# Patient Record
Sex: Male | Born: 1945
Health system: Southern US, Community
[De-identification: ages and names within clinical notes are randomized; demographics above are authoritative.]

## PROBLEM LIST (undated history)

## (undated) DIAGNOSIS — F329 Major depressive disorder, single episode, unspecified: Secondary | ICD-10-CM

## (undated) DIAGNOSIS — F32A Depression, unspecified: Secondary | ICD-10-CM

## (undated) DIAGNOSIS — M1990 Unspecified osteoarthritis, unspecified site: Secondary | ICD-10-CM

## (undated) HISTORY — PX: COLONOSCOPY: SHX174

## (undated) HISTORY — PX: EYE SURGERY: SHX253

## (undated) HISTORY — PX: KNEE ARTHROSCOPY: SUR90

## (undated) HISTORY — PX: CATARACT EXTRACTION: SUR2

## (undated) HISTORY — PX: BACK SURGERY: SHX140

---
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
## 2016-11-20 DIAGNOSIS — F331 Major depressive disorder, recurrent, moderate: Secondary | ICD-10-CM | POA: Diagnosis not present

## 2016-11-26 DIAGNOSIS — F331 Major depressive disorder, recurrent, moderate: Secondary | ICD-10-CM | POA: Diagnosis not present

## 2016-12-11 DIAGNOSIS — F331 Major depressive disorder, recurrent, moderate: Secondary | ICD-10-CM | POA: Diagnosis not present

## 2016-12-11 DIAGNOSIS — F410 Panic disorder [episodic paroxysmal anxiety] without agoraphobia: Secondary | ICD-10-CM | POA: Diagnosis not present

## 2016-12-18 DIAGNOSIS — F331 Major depressive disorder, recurrent, moderate: Secondary | ICD-10-CM | POA: Diagnosis not present

## 2016-12-18 DIAGNOSIS — F410 Panic disorder [episodic paroxysmal anxiety] without agoraphobia: Secondary | ICD-10-CM | POA: Diagnosis not present

## 2016-12-24 DIAGNOSIS — F331 Major depressive disorder, recurrent, moderate: Secondary | ICD-10-CM | POA: Diagnosis not present

## 2016-12-24 DIAGNOSIS — F410 Panic disorder [episodic paroxysmal anxiety] without agoraphobia: Secondary | ICD-10-CM | POA: Diagnosis not present

## 2017-01-01 DIAGNOSIS — F331 Major depressive disorder, recurrent, moderate: Secondary | ICD-10-CM | POA: Diagnosis not present

## 2017-01-08 DIAGNOSIS — F331 Major depressive disorder, recurrent, moderate: Secondary | ICD-10-CM | POA: Diagnosis not present

## 2017-01-08 DIAGNOSIS — F410 Panic disorder [episodic paroxysmal anxiety] without agoraphobia: Secondary | ICD-10-CM | POA: Diagnosis not present

## 2017-01-14 DIAGNOSIS — F331 Major depressive disorder, recurrent, moderate: Secondary | ICD-10-CM | POA: Diagnosis not present

## 2017-01-15 DIAGNOSIS — F331 Major depressive disorder, recurrent, moderate: Secondary | ICD-10-CM | POA: Diagnosis not present

## 2017-01-22 DIAGNOSIS — F410 Panic disorder [episodic paroxysmal anxiety] without agoraphobia: Secondary | ICD-10-CM | POA: Diagnosis not present

## 2017-01-22 DIAGNOSIS — F331 Major depressive disorder, recurrent, moderate: Secondary | ICD-10-CM | POA: Diagnosis not present

## 2017-01-29 DIAGNOSIS — F410 Panic disorder [episodic paroxysmal anxiety] without agoraphobia: Secondary | ICD-10-CM | POA: Diagnosis not present

## 2017-01-29 DIAGNOSIS — F331 Major depressive disorder, recurrent, moderate: Secondary | ICD-10-CM | POA: Diagnosis not present

## 2017-02-05 DIAGNOSIS — F410 Panic disorder [episodic paroxysmal anxiety] without agoraphobia: Secondary | ICD-10-CM | POA: Diagnosis not present

## 2017-02-05 DIAGNOSIS — F331 Major depressive disorder, recurrent, moderate: Secondary | ICD-10-CM | POA: Diagnosis not present

## 2017-02-12 DIAGNOSIS — F331 Major depressive disorder, recurrent, moderate: Secondary | ICD-10-CM | POA: Diagnosis not present

## 2017-02-12 DIAGNOSIS — F410 Panic disorder [episodic paroxysmal anxiety] without agoraphobia: Secondary | ICD-10-CM | POA: Diagnosis not present

## 2017-02-26 DIAGNOSIS — F331 Major depressive disorder, recurrent, moderate: Secondary | ICD-10-CM | POA: Diagnosis not present

## 2017-02-26 DIAGNOSIS — F410 Panic disorder [episodic paroxysmal anxiety] without agoraphobia: Secondary | ICD-10-CM | POA: Diagnosis not present

## 2017-03-12 DIAGNOSIS — F331 Major depressive disorder, recurrent, moderate: Secondary | ICD-10-CM | POA: Diagnosis not present

## 2017-03-12 DIAGNOSIS — F410 Panic disorder [episodic paroxysmal anxiety] without agoraphobia: Secondary | ICD-10-CM | POA: Diagnosis not present

## 2017-03-26 DIAGNOSIS — F331 Major depressive disorder, recurrent, moderate: Secondary | ICD-10-CM | POA: Diagnosis not present

## 2017-04-09 DIAGNOSIS — F331 Major depressive disorder, recurrent, moderate: Secondary | ICD-10-CM | POA: Diagnosis not present

## 2017-04-09 DIAGNOSIS — F410 Panic disorder [episodic paroxysmal anxiety] without agoraphobia: Secondary | ICD-10-CM | POA: Diagnosis not present

## 2017-05-07 DIAGNOSIS — F331 Major depressive disorder, recurrent, moderate: Secondary | ICD-10-CM | POA: Diagnosis not present

## 2017-05-07 DIAGNOSIS — F410 Panic disorder [episodic paroxysmal anxiety] without agoraphobia: Secondary | ICD-10-CM | POA: Diagnosis not present

## 2017-05-27 DIAGNOSIS — F410 Panic disorder [episodic paroxysmal anxiety] without agoraphobia: Secondary | ICD-10-CM | POA: Diagnosis not present

## 2017-05-27 DIAGNOSIS — F331 Major depressive disorder, recurrent, moderate: Secondary | ICD-10-CM | POA: Diagnosis not present

## 2017-06-11 DIAGNOSIS — F331 Major depressive disorder, recurrent, moderate: Secondary | ICD-10-CM | POA: Diagnosis not present

## 2017-06-25 DIAGNOSIS — F331 Major depressive disorder, recurrent, moderate: Secondary | ICD-10-CM | POA: Diagnosis not present

## 2017-06-25 DIAGNOSIS — F410 Panic disorder [episodic paroxysmal anxiety] without agoraphobia: Secondary | ICD-10-CM | POA: Diagnosis not present

## 2017-07-09 DIAGNOSIS — F331 Major depressive disorder, recurrent, moderate: Secondary | ICD-10-CM | POA: Diagnosis not present

## 2017-07-09 DIAGNOSIS — F410 Panic disorder [episodic paroxysmal anxiety] without agoraphobia: Secondary | ICD-10-CM | POA: Diagnosis not present

## 2017-07-23 DIAGNOSIS — F410 Panic disorder [episodic paroxysmal anxiety] without agoraphobia: Secondary | ICD-10-CM | POA: Diagnosis not present

## 2017-07-23 DIAGNOSIS — F331 Major depressive disorder, recurrent, moderate: Secondary | ICD-10-CM | POA: Diagnosis not present

## 2017-08-05 DIAGNOSIS — F410 Panic disorder [episodic paroxysmal anxiety] without agoraphobia: Secondary | ICD-10-CM | POA: Diagnosis not present

## 2017-08-05 DIAGNOSIS — F331 Major depressive disorder, recurrent, moderate: Secondary | ICD-10-CM | POA: Diagnosis not present

## 2017-08-21 DIAGNOSIS — F331 Major depressive disorder, recurrent, moderate: Secondary | ICD-10-CM | POA: Diagnosis not present

## 2017-09-03 DIAGNOSIS — F331 Major depressive disorder, recurrent, moderate: Secondary | ICD-10-CM | POA: Diagnosis not present

## 2017-09-03 DIAGNOSIS — F410 Panic disorder [episodic paroxysmal anxiety] without agoraphobia: Secondary | ICD-10-CM | POA: Diagnosis not present

## 2017-09-04 DIAGNOSIS — Z23 Encounter for immunization: Secondary | ICD-10-CM | POA: Diagnosis not present

## 2017-09-07 DIAGNOSIS — F331 Major depressive disorder, recurrent, moderate: Secondary | ICD-10-CM | POA: Diagnosis not present

## 2017-09-17 DIAGNOSIS — F410 Panic disorder [episodic paroxysmal anxiety] without agoraphobia: Secondary | ICD-10-CM | POA: Diagnosis not present

## 2017-09-17 DIAGNOSIS — F331 Major depressive disorder, recurrent, moderate: Secondary | ICD-10-CM | POA: Diagnosis not present

## 2017-10-15 DIAGNOSIS — F331 Major depressive disorder, recurrent, moderate: Secondary | ICD-10-CM | POA: Diagnosis not present

## 2017-10-29 DIAGNOSIS — F410 Panic disorder [episodic paroxysmal anxiety] without agoraphobia: Secondary | ICD-10-CM | POA: Diagnosis not present

## 2017-10-29 DIAGNOSIS — F331 Major depressive disorder, recurrent, moderate: Secondary | ICD-10-CM | POA: Diagnosis not present

## 2017-11-05 DIAGNOSIS — F331 Major depressive disorder, recurrent, moderate: Secondary | ICD-10-CM | POA: Diagnosis not present

## 2017-11-05 DIAGNOSIS — F410 Panic disorder [episodic paroxysmal anxiety] without agoraphobia: Secondary | ICD-10-CM | POA: Diagnosis not present

## 2017-11-19 DIAGNOSIS — F410 Panic disorder [episodic paroxysmal anxiety] without agoraphobia: Secondary | ICD-10-CM | POA: Diagnosis not present

## 2017-11-19 DIAGNOSIS — F331 Major depressive disorder, recurrent, moderate: Secondary | ICD-10-CM | POA: Diagnosis not present

## 2017-12-10 DIAGNOSIS — F331 Major depressive disorder, recurrent, moderate: Secondary | ICD-10-CM | POA: Diagnosis not present

## 2017-12-10 DIAGNOSIS — F410 Panic disorder [episodic paroxysmal anxiety] without agoraphobia: Secondary | ICD-10-CM | POA: Diagnosis not present

## 2017-12-30 DIAGNOSIS — F331 Major depressive disorder, recurrent, moderate: Secondary | ICD-10-CM | POA: Diagnosis not present

## 2017-12-30 DIAGNOSIS — F410 Panic disorder [episodic paroxysmal anxiety] without agoraphobia: Secondary | ICD-10-CM | POA: Diagnosis not present

## 2018-01-21 DIAGNOSIS — F331 Major depressive disorder, recurrent, moderate: Secondary | ICD-10-CM | POA: Diagnosis not present

## 2018-01-21 DIAGNOSIS — F410 Panic disorder [episodic paroxysmal anxiety] without agoraphobia: Secondary | ICD-10-CM | POA: Diagnosis not present

## 2018-02-18 DIAGNOSIS — F331 Major depressive disorder, recurrent, moderate: Secondary | ICD-10-CM | POA: Diagnosis not present

## 2018-02-18 DIAGNOSIS — F410 Panic disorder [episodic paroxysmal anxiety] without agoraphobia: Secondary | ICD-10-CM | POA: Diagnosis not present

## 2018-03-04 DIAGNOSIS — F331 Major depressive disorder, recurrent, moderate: Secondary | ICD-10-CM | POA: Diagnosis not present

## 2018-03-10 DIAGNOSIS — F410 Panic disorder [episodic paroxysmal anxiety] without agoraphobia: Secondary | ICD-10-CM | POA: Diagnosis not present

## 2018-03-10 DIAGNOSIS — F331 Major depressive disorder, recurrent, moderate: Secondary | ICD-10-CM | POA: Diagnosis not present

## 2018-03-29 DIAGNOSIS — F331 Major depressive disorder, recurrent, moderate: Secondary | ICD-10-CM | POA: Diagnosis not present

## 2018-03-29 DIAGNOSIS — F410 Panic disorder [episodic paroxysmal anxiety] without agoraphobia: Secondary | ICD-10-CM | POA: Diagnosis not present

## 2018-04-28 DIAGNOSIS — F331 Major depressive disorder, recurrent, moderate: Secondary | ICD-10-CM | POA: Diagnosis not present

## 2018-05-14 DIAGNOSIS — F331 Major depressive disorder, recurrent, moderate: Secondary | ICD-10-CM | POA: Diagnosis not present

## 2018-05-14 DIAGNOSIS — F410 Panic disorder [episodic paroxysmal anxiety] without agoraphobia: Secondary | ICD-10-CM | POA: Diagnosis not present

## 2018-05-26 DIAGNOSIS — F410 Panic disorder [episodic paroxysmal anxiety] without agoraphobia: Secondary | ICD-10-CM | POA: Diagnosis not present

## 2018-05-26 DIAGNOSIS — F331 Major depressive disorder, recurrent, moderate: Secondary | ICD-10-CM | POA: Diagnosis not present

## 2018-06-16 DIAGNOSIS — F410 Panic disorder [episodic paroxysmal anxiety] without agoraphobia: Secondary | ICD-10-CM | POA: Diagnosis not present

## 2018-06-16 DIAGNOSIS — F331 Major depressive disorder, recurrent, moderate: Secondary | ICD-10-CM | POA: Diagnosis not present

## 2018-06-30 DIAGNOSIS — F331 Major depressive disorder, recurrent, moderate: Secondary | ICD-10-CM | POA: Diagnosis not present

## 2018-06-30 DIAGNOSIS — F410 Panic disorder [episodic paroxysmal anxiety] without agoraphobia: Secondary | ICD-10-CM | POA: Diagnosis not present

## 2018-07-14 DIAGNOSIS — F410 Panic disorder [episodic paroxysmal anxiety] without agoraphobia: Secondary | ICD-10-CM | POA: Diagnosis not present

## 2018-07-14 DIAGNOSIS — F331 Major depressive disorder, recurrent, moderate: Secondary | ICD-10-CM | POA: Diagnosis not present

## 2018-07-22 DIAGNOSIS — L918 Other hypertrophic disorders of the skin: Secondary | ICD-10-CM | POA: Diagnosis not present

## 2018-07-22 DIAGNOSIS — D229 Melanocytic nevi, unspecified: Secondary | ICD-10-CM | POA: Diagnosis not present

## 2018-07-28 DIAGNOSIS — F410 Panic disorder [episodic paroxysmal anxiety] without agoraphobia: Secondary | ICD-10-CM | POA: Diagnosis not present

## 2018-07-28 DIAGNOSIS — F331 Major depressive disorder, recurrent, moderate: Secondary | ICD-10-CM | POA: Diagnosis not present

## 2018-08-11 DIAGNOSIS — F410 Panic disorder [episodic paroxysmal anxiety] without agoraphobia: Secondary | ICD-10-CM | POA: Diagnosis not present

## 2018-08-11 DIAGNOSIS — F331 Major depressive disorder, recurrent, moderate: Secondary | ICD-10-CM | POA: Diagnosis not present

## 2018-08-23 DIAGNOSIS — Z0000 Encounter for general adult medical examination without abnormal findings: Secondary | ICD-10-CM | POA: Diagnosis not present

## 2018-08-23 DIAGNOSIS — R42 Dizziness and giddiness: Secondary | ICD-10-CM | POA: Diagnosis not present

## 2018-08-23 DIAGNOSIS — R319 Hematuria, unspecified: Secondary | ICD-10-CM | POA: Diagnosis not present

## 2018-08-23 DIAGNOSIS — Z23 Encounter for immunization: Secondary | ICD-10-CM | POA: Diagnosis not present

## 2018-08-23 DIAGNOSIS — Z136 Encounter for screening for cardiovascular disorders: Secondary | ICD-10-CM | POA: Diagnosis not present

## 2018-08-23 DIAGNOSIS — Z9109 Other allergy status, other than to drugs and biological substances: Secondary | ICD-10-CM | POA: Diagnosis not present

## 2018-08-23 DIAGNOSIS — Z125 Encounter for screening for malignant neoplasm of prostate: Secondary | ICD-10-CM | POA: Diagnosis not present

## 2018-08-23 DIAGNOSIS — Z1211 Encounter for screening for malignant neoplasm of colon: Secondary | ICD-10-CM | POA: Diagnosis not present

## 2018-08-25 DIAGNOSIS — F410 Panic disorder [episodic paroxysmal anxiety] without agoraphobia: Secondary | ICD-10-CM | POA: Diagnosis not present

## 2018-08-25 DIAGNOSIS — F331 Major depressive disorder, recurrent, moderate: Secondary | ICD-10-CM | POA: Diagnosis not present

## 2018-09-02 DIAGNOSIS — Z1211 Encounter for screening for malignant neoplasm of colon: Secondary | ICD-10-CM | POA: Diagnosis not present

## 2018-09-08 DIAGNOSIS — F331 Major depressive disorder, recurrent, moderate: Secondary | ICD-10-CM | POA: Diagnosis not present

## 2018-09-08 DIAGNOSIS — F410 Panic disorder [episodic paroxysmal anxiety] without agoraphobia: Secondary | ICD-10-CM | POA: Diagnosis not present

## 2018-09-24 DIAGNOSIS — F410 Panic disorder [episodic paroxysmal anxiety] without agoraphobia: Secondary | ICD-10-CM | POA: Diagnosis not present

## 2018-09-24 DIAGNOSIS — F331 Major depressive disorder, recurrent, moderate: Secondary | ICD-10-CM | POA: Diagnosis not present

## 2018-10-01 DIAGNOSIS — F331 Major depressive disorder, recurrent, moderate: Secondary | ICD-10-CM | POA: Diagnosis not present

## 2018-10-06 DIAGNOSIS — F331 Major depressive disorder, recurrent, moderate: Secondary | ICD-10-CM | POA: Diagnosis not present

## 2018-10-20 DIAGNOSIS — F331 Major depressive disorder, recurrent, moderate: Secondary | ICD-10-CM | POA: Diagnosis not present

## 2018-10-20 DIAGNOSIS — F410 Panic disorder [episodic paroxysmal anxiety] without agoraphobia: Secondary | ICD-10-CM | POA: Diagnosis not present

## 2018-11-03 DIAGNOSIS — F410 Panic disorder [episodic paroxysmal anxiety] without agoraphobia: Secondary | ICD-10-CM | POA: Diagnosis not present

## 2018-11-03 DIAGNOSIS — F331 Major depressive disorder, recurrent, moderate: Secondary | ICD-10-CM | POA: Diagnosis not present

## 2018-11-24 DIAGNOSIS — F331 Major depressive disorder, recurrent, moderate: Secondary | ICD-10-CM | POA: Diagnosis not present

## 2018-12-15 DIAGNOSIS — F410 Panic disorder [episodic paroxysmal anxiety] without agoraphobia: Secondary | ICD-10-CM | POA: Diagnosis not present

## 2018-12-15 DIAGNOSIS — F331 Major depressive disorder, recurrent, moderate: Secondary | ICD-10-CM | POA: Diagnosis not present

## 2019-01-26 DIAGNOSIS — F410 Panic disorder [episodic paroxysmal anxiety] without agoraphobia: Secondary | ICD-10-CM | POA: Diagnosis not present

## 2019-01-26 DIAGNOSIS — F331 Major depressive disorder, recurrent, moderate: Secondary | ICD-10-CM | POA: Diagnosis not present

## 2019-02-16 DIAGNOSIS — F331 Major depressive disorder, recurrent, moderate: Secondary | ICD-10-CM | POA: Diagnosis not present

## 2019-02-16 DIAGNOSIS — F410 Panic disorder [episodic paroxysmal anxiety] without agoraphobia: Secondary | ICD-10-CM | POA: Diagnosis not present

## 2019-02-24 DIAGNOSIS — F331 Major depressive disorder, recurrent, moderate: Secondary | ICD-10-CM | POA: Diagnosis not present

## 2019-02-24 DIAGNOSIS — F410 Panic disorder [episodic paroxysmal anxiety] without agoraphobia: Secondary | ICD-10-CM | POA: Diagnosis not present

## 2019-03-03 DIAGNOSIS — F331 Major depressive disorder, recurrent, moderate: Secondary | ICD-10-CM | POA: Diagnosis not present

## 2019-03-03 DIAGNOSIS — F410 Panic disorder [episodic paroxysmal anxiety] without agoraphobia: Secondary | ICD-10-CM | POA: Diagnosis not present

## 2019-03-09 DIAGNOSIS — F410 Panic disorder [episodic paroxysmal anxiety] without agoraphobia: Secondary | ICD-10-CM | POA: Diagnosis not present

## 2019-03-09 DIAGNOSIS — F331 Major depressive disorder, recurrent, moderate: Secondary | ICD-10-CM | POA: Diagnosis not present

## 2019-03-16 DIAGNOSIS — F331 Major depressive disorder, recurrent, moderate: Secondary | ICD-10-CM | POA: Diagnosis not present

## 2019-03-16 DIAGNOSIS — F410 Panic disorder [episodic paroxysmal anxiety] without agoraphobia: Secondary | ICD-10-CM | POA: Diagnosis not present

## 2019-03-23 DIAGNOSIS — F410 Panic disorder [episodic paroxysmal anxiety] without agoraphobia: Secondary | ICD-10-CM | POA: Diagnosis not present

## 2019-03-23 DIAGNOSIS — F331 Major depressive disorder, recurrent, moderate: Secondary | ICD-10-CM | POA: Diagnosis not present

## 2019-04-01 DIAGNOSIS — F331 Major depressive disorder, recurrent, moderate: Secondary | ICD-10-CM | POA: Diagnosis not present

## 2019-04-01 DIAGNOSIS — F410 Panic disorder [episodic paroxysmal anxiety] without agoraphobia: Secondary | ICD-10-CM | POA: Diagnosis not present

## 2019-04-06 DIAGNOSIS — F410 Panic disorder [episodic paroxysmal anxiety] without agoraphobia: Secondary | ICD-10-CM | POA: Diagnosis not present

## 2019-04-06 DIAGNOSIS — F331 Major depressive disorder, recurrent, moderate: Secondary | ICD-10-CM | POA: Diagnosis not present

## 2019-04-13 DIAGNOSIS — F410 Panic disorder [episodic paroxysmal anxiety] without agoraphobia: Secondary | ICD-10-CM | POA: Diagnosis not present

## 2019-04-13 DIAGNOSIS — F331 Major depressive disorder, recurrent, moderate: Secondary | ICD-10-CM | POA: Diagnosis not present

## 2019-04-20 DIAGNOSIS — F410 Panic disorder [episodic paroxysmal anxiety] without agoraphobia: Secondary | ICD-10-CM | POA: Diagnosis not present

## 2019-04-20 DIAGNOSIS — F331 Major depressive disorder, recurrent, moderate: Secondary | ICD-10-CM | POA: Diagnosis not present

## 2019-04-28 DIAGNOSIS — F410 Panic disorder [episodic paroxysmal anxiety] without agoraphobia: Secondary | ICD-10-CM | POA: Diagnosis not present

## 2019-04-28 DIAGNOSIS — F331 Major depressive disorder, recurrent, moderate: Secondary | ICD-10-CM | POA: Diagnosis not present

## 2019-05-04 DIAGNOSIS — F410 Panic disorder [episodic paroxysmal anxiety] without agoraphobia: Secondary | ICD-10-CM | POA: Diagnosis not present

## 2019-05-04 DIAGNOSIS — F331 Major depressive disorder, recurrent, moderate: Secondary | ICD-10-CM | POA: Diagnosis not present

## 2019-05-11 DIAGNOSIS — F331 Major depressive disorder, recurrent, moderate: Secondary | ICD-10-CM | POA: Diagnosis not present

## 2019-05-11 DIAGNOSIS — F410 Panic disorder [episodic paroxysmal anxiety] without agoraphobia: Secondary | ICD-10-CM | POA: Diagnosis not present

## 2019-05-20 ENCOUNTER — Emergency Department
Admission: EM | Admit: 2019-05-20 | Discharge: 2019-05-20 | Disposition: A | Discharge status: Home / Self Care | Payer: Medicare Other | Attending: Emergency Medicine | Admitting: Emergency Medicine

## 2019-05-20 ENCOUNTER — Other Ambulatory Visit: Payer: Self-pay

## 2019-05-20 DIAGNOSIS — Z79899 Other long term (current) drug therapy: Secondary | ICD-10-CM | POA: Insufficient documentation

## 2019-05-20 DIAGNOSIS — M5416 Radiculopathy, lumbar region: Secondary | ICD-10-CM | POA: Insufficient documentation

## 2019-05-20 DIAGNOSIS — M25551 Pain in right hip: Secondary | ICD-10-CM | POA: Diagnosis present

## 2019-05-20 MED ORDER — METHOCARBAMOL 500 MG PO TABS
500.0000 mg | ORAL_TABLET | Freq: Once | ORAL | Status: AC
  Administered 2019-05-20: 500 mg via ORAL
  Filled 2019-05-20: qty 1

## 2019-05-20 MED ORDER — PREDNISONE 20 MG PO TABS
60.0000 mg | ORAL_TABLET | Freq: Once | ORAL | Status: AC
  Administered 2019-05-20: 60 mg via ORAL
  Filled 2019-05-20: qty 3

## 2019-05-20 MED ORDER — METHOCARBAMOL 500 MG PO TABS: 500.0000 mg | ORAL_TABLET | Freq: Three times a day (TID) | ORAL | 0 refills | Status: AC | PRN

## 2019-05-20 MED ORDER — PREDNISONE 10 MG (21) PO TBPK: ORAL_TABLET | Freq: Every day | ORAL | 0 refills | Status: AC

## 2019-05-20 NOTE — ED Provider Notes (Signed)
[facility] Emergency Department Provider Note  ____________________________________________  Time seen: Approximately 10:00 PM  I have reviewed the triage vital signs and the nursing notes.   HISTORY  Chief Complaint Knee Pain and Hip Pain    HPI Jose Bender is a 73 y.o. male presents to the emergency department with aching right lateral hip pain that radiates to posterior aspect of knee.  Patient states that he has had a history of low back pain and bulging disks.  He denies recent falls or traumas.  No fever, bowel or bladder incontinence or saddle anesthesia.  Patient states it is become increasingly uncomfortable for him to lie supine.  He has tried Tylenol at home but states that this only temporarily relieves his pain.  No other alleviating measures have been attempted.        No past medical history on file.  There are no active problems to display for this patient.     Prior to Admission medications   Medication Sig Start Date End Date Taking? Authorizing Provider  acetaminophen (TYLENOL) 500 MG tablet Take 1,000 mg by mouth every 6 (six) hours as needed.   Yes [provider]  sertraline (ZOLOFT) 100 MG tablet Take 100 mg by mouth daily.   Yes [provider]  methocarbamol (ROBAXIN) 500 MG tablet Take 1 tablet (500 mg total) by mouth every 8 (eight) hours as needed for up to 5 days. 05/20/19 05/25/19  Lannie Fields, PA-C  predniSONE (STERAPRED UNI-PAK 21 TAB) 10 MG (21) TBPK tablet Take by mouth daily for 7 days. Take 6 tablets the first day, take 5 tablets the second day, take 4 tablets the third day, take 3 tablets the fourth day, take 2 tablets the fifth day, take 1 tablet the sixth day. 05/20/19 05/27/19  Lannie Fields, PA-C    Allergies Codeine  No family history on file.  Social History Social History   Tobacco Use  . Smoking status: Not on file  Substance Use Topics  . Alcohol use: Not on file  . Drug use:  Not on file     Review of Systems  Constitutional: No fever/chills Eyes: No visual changes. No discharge ENT: No upper respiratory complaints. Cardiovascular: no chest pain. Respiratory: no cough. No SOB. Gastrointestinal: No abdominal pain.  No nausea, no vomiting.  No diarrhea.  No constipation. Genitourinary: Negative for dysuria. No hematuria Musculoskeletal: Patient has right lateral hip pain.  Skin: Negative for rash, abrasions, lacerations, ecchymosis. Neurological: Negative for headaches, focal weakness or numbness.   ____________________________________________   PHYSICAL EXAM:  VITAL SIGNS: ED Triage Vitals  Enc Vitals Group     BP 05/20/19 2115 (!) 162/92     Pulse Rate 05/20/19 2115 73     Resp 05/20/19 2158 16     Temp 05/20/19 2115 97.8 F (36.6 C)     Temp Source 05/20/19 2115 Oral     SpO2 05/20/19 2115 99 %     Weight 05/20/19 2115 190 lb (86.2 kg)     Height 05/20/19 2115 5\' 9"  (1.753 m)     Head Circumference --      Peak Flow --      Pain Score 05/20/19 2114 6     Pain Loc --      Pain Edu? --      Excl. in [place]? --      Constitutional: Alert and oriented. Well appearing and in no acute distress. Eyes: Conjunctivae are normal. PERRL.  EOMI. Head: Atraumatic. ENT: Cardiovascular: Normal rate, regular rhythm. Normal S1 and S2.  Good peripheral circulation. Respiratory: Normal respiratory effort without tachypnea or retractions. Lungs CTAB. Good air entry to the bases with no decreased or absent breath sounds. Musculoskeletal: Full range of motion to all extremities. No gross deformities appreciated.  No reproducible pain over the right trochanteric bursa.  Patient has a positive straight leg raise test on the right.  No significant paraspinal muscle tenderness along the lumbar spine.   Neurologic:  Normal speech and language. No gross focal neurologic deficits are appreciated.  Patient has mildly diminished sensation along L5, S1 dermatomes on the  right. Skin:  Skin is warm, dry and intact. No rash noted. Psychiatric: Mood and affect are normal. Speech and behavior are normal. Patient exhibits appropriate insight and judgement.   ____________________________________________   LABS (all labs ordered are listed, but only abnormal results are displayed)  Labs Reviewed - No data to display ____________________________________________  EKG   ____________________________________________  RADIOLOGY   No results found.  ____________________________________________    PROCEDURES  Procedure(s) performed:    Procedures    Medications  predniSONE (DELTASONE) tablet 60 mg (60 mg Oral Given 05/20/19 2155)  methocarbamol (ROBAXIN) tablet 500 mg (500 mg Oral Given 05/20/19 2155)     ____________________________________________   INITIAL IMPRESSION / ASSESSMENT AND PLAN / ED COURSE  Pertinent labs & imaging results that were available during my care of the patient were reviewed by me and considered in my medical decision making (see chart for details).  Review of the [place] CSRS was performed in accordance of the [place] prior to dispensing any controlled drugs.        Assessment and Plan:  Radiculopathy 73 year old male presents to the emergency department with right lower extremity radiculopathy for the past 2 weeks.  On physical exam, patient is mildly hypertensive.  Vital signs are otherwise stable.  Patient's neuro exam is reassuring.  He had symmetric strength in the upper and lower extremities with no hypo-or hyperreflexia.  He did have mildly diminished sensation in L5-S1 dermatomes on the right.  He has a positive straight leg raise test on the right.  There is no warmth, erythema or swelling of the right lower extremity.  Patient was treated with prednisone and Robaxin in the emergency department.  He was discharged with prednisone and Robaxin.  Patient was advised to follow-up with neurosurgery if  radiculopathy does  not improve.  All patient questions were answered.     ____________________________________________  FINAL CLINICAL IMPRESSION(S) / ED DIAGNOSES  Final diagnoses:  Lumbar radiculopathy      NEW MEDICATIONS STARTED DURING THIS VISIT:  ED Discharge Orders         Ordered    predniSONE (STERAPRED UNI-PAK 21 TAB) 10 MG (21) TBPK tablet  Daily     05/20/19 2146    methocarbamol (ROBAXIN) 500 MG tablet  Every 8 hours PRN     05/20/19 2146              This chart was dictated using voice recognition software/Dragon. Despite best efforts to proofread, errors can occur which can change the meaning. Any change was purely unintentional.    Karren Cobble 05/20/19 2207    Duffy Bruce, MD 05/21/19 1023

## 2019-05-20 NOTE — ED Triage Notes (Addendum)
Reports right knee up into right hip pain for approximately 2 weeks, worse tonight.  Patient is able to place right ankle on top of left knee without any obvious difficulty or distress noted.

## 2019-05-26 ENCOUNTER — Other Ambulatory Visit: Payer: Self-pay | Admitting: Student

## 2019-05-26 DIAGNOSIS — M5416 Radiculopathy, lumbar region: Secondary | ICD-10-CM | POA: Diagnosis not present

## 2019-05-30 DIAGNOSIS — F331 Major depressive disorder, recurrent, moderate: Secondary | ICD-10-CM | POA: Diagnosis not present

## 2019-06-08 DIAGNOSIS — F410 Panic disorder [episodic paroxysmal anxiety] without agoraphobia: Secondary | ICD-10-CM | POA: Diagnosis not present

## 2019-06-08 DIAGNOSIS — F331 Major depressive disorder, recurrent, moderate: Secondary | ICD-10-CM | POA: Diagnosis not present

## 2019-06-09 ENCOUNTER — Other Ambulatory Visit: Payer: Self-pay

## 2019-06-09 ENCOUNTER — Ambulatory Visit
Admission: RE | Admit: 2019-06-09 | Discharge: 2019-06-09 | Disposition: A | Discharge status: Home / Self Care | Payer: Medicare Other | Source: Ambulatory Visit | Attending: Student | Admitting: Student

## 2019-06-09 DIAGNOSIS — M545 Low back pain: Secondary | ICD-10-CM | POA: Diagnosis not present

## 2019-06-09 DIAGNOSIS — M5416 Radiculopathy, lumbar region: Secondary | ICD-10-CM | POA: Diagnosis not present

## 2019-06-13 DIAGNOSIS — M5441 Lumbago with sciatica, right side: Secondary | ICD-10-CM | POA: Diagnosis not present

## 2019-06-13 DIAGNOSIS — G8929 Other chronic pain: Secondary | ICD-10-CM | POA: Diagnosis not present

## 2019-06-15 DIAGNOSIS — F410 Panic disorder [episodic paroxysmal anxiety] without agoraphobia: Secondary | ICD-10-CM | POA: Diagnosis not present

## 2019-06-15 DIAGNOSIS — F331 Major depressive disorder, recurrent, moderate: Secondary | ICD-10-CM | POA: Diagnosis not present

## 2019-06-21 ENCOUNTER — Other Ambulatory Visit: Payer: Self-pay | Admitting: Neurosurgery

## 2019-06-21 DIAGNOSIS — M5416 Radiculopathy, lumbar region: Secondary | ICD-10-CM | POA: Diagnosis not present

## 2019-06-21 DIAGNOSIS — M5116 Intervertebral disc disorders with radiculopathy, lumbar region: Secondary | ICD-10-CM | POA: Diagnosis not present

## 2019-06-21 NOTE — Addendum Note (Signed)
Addended by: Deetta Perla on: 06/21/2019 05:31 PM   Modules accepted: Orders

## 2019-06-23 ENCOUNTER — Other Ambulatory Visit: Payer: Self-pay

## 2019-06-23 ENCOUNTER — Encounter
Admission: RE | Admit: 2019-06-23 | Discharge: 2019-06-23 | Disposition: A | Discharge status: Home / Self Care | Payer: Medicare Other | Source: Ambulatory Visit | Attending: Neurosurgery | Admitting: Neurosurgery

## 2019-06-23 DIAGNOSIS — R001 Bradycardia, unspecified: Secondary | ICD-10-CM | POA: Diagnosis not present

## 2019-06-23 DIAGNOSIS — F410 Panic disorder [episodic paroxysmal anxiety] without agoraphobia: Secondary | ICD-10-CM | POA: Diagnosis not present

## 2019-06-23 DIAGNOSIS — F331 Major depressive disorder, recurrent, moderate: Secondary | ICD-10-CM | POA: Diagnosis not present

## 2019-06-23 DIAGNOSIS — Z20828 Contact with and (suspected) exposure to other viral communicable diseases: Secondary | ICD-10-CM | POA: Insufficient documentation

## 2019-06-23 DIAGNOSIS — Z01818 Encounter for other preprocedural examination: Secondary | ICD-10-CM | POA: Diagnosis not present

## 2019-06-23 DIAGNOSIS — Z01810 Encounter for preprocedural cardiovascular examination: Secondary | ICD-10-CM | POA: Diagnosis not present

## 2019-06-23 HISTORY — DX: Depression, unspecified: F32.A

## 2019-06-23 HISTORY — DX: Unspecified osteoarthritis, unspecified site: M19.90

## 2019-06-23 LAB — URINALYSIS, ROUTINE W REFLEX MICROSCOPIC
Bacteria, UA: NONE SEEN
Glucose, UA: NEGATIVE mg/dL
Ketones, ur: NEGATIVE mg/dL
Leukocytes,Ua: NEGATIVE
Nitrite: NEGATIVE
Protein, ur: NEGATIVE mg/dL
Specific Gravity, Urine: 1.029 (ref 1.005–1.030)
pH: 5 (ref 5.0–8.0)

## 2019-06-23 LAB — CBC
HCT: 43.7 % (ref 39.0–52.0)
Hemoglobin: 14.4 g/dL (ref 13.0–17.0)
MCH: 30.9 pg (ref 26.0–34.0)
MCHC: 33 g/dL (ref 30.0–36.0)
MCV: 93.8 fL (ref 80.0–100.0)
Platelets: 253 10*3/uL (ref 150–400)
RBC: 4.66 MIL/uL (ref 4.22–5.81)
RDW: 14 % (ref 11.5–15.5)
WBC: 9.4 10*3/uL (ref 4.0–10.5)
nRBC: 0 % (ref 0.0–0.2)

## 2019-06-23 LAB — SURGICAL PCR SCREEN
MRSA, PCR: NEGATIVE
Staphylococcus aureus: POSITIVE — AB

## 2019-06-23 LAB — BASIC METABOLIC PANEL
Anion gap: 10 (ref 5–15)
BUN: 18 mg/dL (ref 8–23)
CO2: 23 mmol/L (ref 22–32)
Calcium: 9 mg/dL (ref 8.9–10.3)
Chloride: 107 mmol/L (ref 98–111)
Creatinine, Ser: 1.06 mg/dL (ref 0.61–1.24)
GFR calc Af Amer: >60 mL/min (ref >60)
GFR calc non Af Amer: >60 mL/min (ref >60)
Glucose, Bld: 108 mg/dL — ABNORMAL HIGH (ref 70–99)
Potassium: 3.6 mmol/L (ref 3.5–5.1)
Sodium: 140 mmol/L (ref 135–145)

## 2019-06-23 LAB — TYPE AND SCREEN
ABO/RH(D): O POS
Antibody Screen: NEGATIVE

## 2019-06-23 LAB — PROTIME-INR
INR: 1 (ref 0.8–1.2)
Prothrombin Time: 13.3 seconds (ref 11.4–15.2)

## 2019-06-23 LAB — APTT: aPTT: 33 seconds (ref 24–36)

## 2019-06-23 NOTE — Patient Instructions (Signed)
Your procedure is scheduled on: Mon 8/10 Report to Day Surgery. To find out your arrival time please call [phone] between 1PM - 3PM on Friday 8/7.  Remember: Instructions that are not followed completely may result in serious medical risk,  up to and including death, or upon the discretion of your surgeon and anesthesiologist your  surgery may need to be rescheduled.     _X__ 1. Do not eat food after midnight the night before your procedure.                 No gum chewing or hard candies. You may drink clear liquids up to 2 hours                 before you are scheduled to arrive for your surgery- DO not drink clear                 liquids within 2 hours of the start of your surgery.                 Clear Liquids include:  water, apple juice without pulp, clear carbohydrate                 drink such as Clearfast of Gatorade, Black Coffee or Tea (Do not add                 anything to coffee or tea).  __X__2.  On the morning of surgery brush your teeth with toothpaste and water, you                may rinse your mouth with mouthwash if you wish.  Do not swallow any toothpaste of mouthwash.     _X__ 3.  No Alcohol for 24 hours before or after surgery.   ___ 4.  Do Not Smoke or use e-cigarettes For 24 Hours Prior to Your Surgery.                 Do not use any chewable tobacco products for at least 6 hours prior to                 surgery.  ____  5.  Bring all medications with you on the day of surgery if instructed.   __x__  6.  Notify your doctor if there is any change in your medical condition      (cold, fever, infections).     Do not wear jewelry, make-up, hairpins, clips or nail polish. Do not wear lotions, powders, or perfumes. You may wear deodorant. Do not shave 48 hours prior to surgery. Men may shave face and neck. Do not bring valuables to the hospital.    [facility] is not responsible for any belongings or valuables.  Contacts, dentures  or bridgework may not be worn into surgery. Leave your suitcase in the car. After surgery it may be brought to your room. For patients admitted to the hospital, discharge time is determined by your treatment team.   Patients discharged the day of surgery will not be allowed to drive home.   Please read over the following fact sheets that you were given:     _x___ Take these medicines the morning of surgery with A SIP OF WATER:    1. acetaminophen (TYLENOL) 500 MG tablet if needed  2. gabapentin (NEURONTIN) 300 MG capsule  3. methocarbamol (ROBAXIN) 500 MG tablet  4.  5.  6.  ____ Fleet Enema (as directed)  _x___ Use CHG Soap as directed  ____ Use inhalers on the day of surgery  ____ Stop metformin 2 days prior to surgery    ____ Take 1/2 of usual insulin dose the night before surgery. No insulin the morning          of surgery.   ____ Stop Coumadin/Plavix/aspirin on   __x__ Stop Anti-inflammatories No ibuprofen aleve or aspirin until after surgery   ____ Stop supplements until after surgery.    ____ Bring C-Pap to the hospital.

## 2019-06-24 LAB — SARS CORONAVIRUS 2 (TAT 6-24 HRS): SARS Coronavirus 2: NEGATIVE

## 2019-06-24 NOTE — Pre-Procedure Instructions (Signed)
Positive staph aureus results sent to Dr. Lacinda Axon for review.  Asked if wanted any other antibiotic?  Also sent UA results to Dr. Lacinda Axon for review.

## 2019-06-27 ENCOUNTER — Encounter
Admission: RE | Disposition: A | Discharge status: Home / Self Care | Payer: Self-pay | Source: Home / Self Care | Attending: Neurosurgery

## 2019-06-27 ENCOUNTER — Ambulatory Visit: Payer: Medicare Other | Admitting: Certified Registered Nurse Anesthetist

## 2019-06-27 ENCOUNTER — Other Ambulatory Visit: Payer: Self-pay

## 2019-06-27 ENCOUNTER — Ambulatory Visit: Payer: Medicare Other

## 2019-06-27 ENCOUNTER — Ambulatory Visit
Admission: RE | Admit: 2019-06-27 | Discharge: 2019-06-27 | Disposition: A | Discharge status: Home / Self Care | Payer: Medicare Other | Attending: Neurosurgery | Admitting: Neurosurgery

## 2019-06-27 DIAGNOSIS — Z419 Encounter for procedure for purposes other than remedying health state, unspecified: Secondary | ICD-10-CM

## 2019-06-27 DIAGNOSIS — M48061 Spinal stenosis, lumbar region without neurogenic claudication: Secondary | ICD-10-CM | POA: Diagnosis not present

## 2019-06-27 DIAGNOSIS — M1990 Unspecified osteoarthritis, unspecified site: Secondary | ICD-10-CM | POA: Diagnosis not present

## 2019-06-27 DIAGNOSIS — Z885 Allergy status to narcotic agent status: Secondary | ICD-10-CM | POA: Diagnosis not present

## 2019-06-27 DIAGNOSIS — F329 Major depressive disorder, single episode, unspecified: Secondary | ICD-10-CM | POA: Insufficient documentation

## 2019-06-27 DIAGNOSIS — M48062 Spinal stenosis, lumbar region with neurogenic claudication: Secondary | ICD-10-CM | POA: Diagnosis not present

## 2019-06-27 DIAGNOSIS — Z981 Arthrodesis status: Secondary | ICD-10-CM | POA: Diagnosis not present

## 2019-06-27 DIAGNOSIS — Z79899 Other long term (current) drug therapy: Secondary | ICD-10-CM | POA: Diagnosis not present

## 2019-06-27 DIAGNOSIS — M5416 Radiculopathy, lumbar region: Secondary | ICD-10-CM | POA: Diagnosis not present

## 2019-06-27 DIAGNOSIS — Z87891 Personal history of nicotine dependence: Secondary | ICD-10-CM | POA: Diagnosis not present

## 2019-06-27 HISTORY — PX: LUMBAR LAMINECTOMY/DECOMPRESSION MICRODISCECTOMY: SHX5026

## 2019-06-27 LAB — ABO/RH: ABO/RH(D): O POS

## 2019-06-27 SURGERY — LUMBAR LAMINECTOMY/DECOMPRESSION MICRODISCECTOMY 1 LEVEL
Anesthesia: General | Site: Back

## 2019-06-27 MED ORDER — ONDANSETRON HCL 4 MG/2ML IJ SOLN
INTRAMUSCULAR | Status: DC | PRN
  Administered 2019-06-27: 4 mg via INTRAVENOUS

## 2019-06-27 MED ORDER — LIDOCAINE-EPINEPHRINE (PF) 1 %-1:200000 IJ SOLN
INTRAMUSCULAR | Status: DC | PRN
  Administered 2019-06-27: 10 mL

## 2019-06-27 MED ORDER — LIDOCAINE HCL (CARDIAC) PF 100 MG/5ML IV SOSY
PREFILLED_SYRINGE | INTRAVENOUS | Status: DC | PRN
  Administered 2019-06-27: 90 mg via INTRAVENOUS

## 2019-06-27 MED ORDER — ACETAMINOPHEN 10 MG/ML IV SOLN
INTRAVENOUS | Status: AC
  Filled 2019-06-27: qty 100

## 2019-06-27 MED ORDER — OXYCODONE HCL 5 MG PO TABS: 5.0000 mg | ORAL_TABLET | ORAL | 0 refills | Status: DC | PRN

## 2019-06-27 MED ORDER — EPHEDRINE SULFATE 50 MG/ML IJ SOLN
INTRAMUSCULAR | Status: AC
  Filled 2019-06-27: qty 1

## 2019-06-27 MED ORDER — OXYCODONE HCL 5 MG PO TABS
ORAL_TABLET | ORAL | Status: AC
  Administered 2019-06-27: 5 mg
  Filled 2019-06-27: qty 1

## 2019-06-27 MED ORDER — ONDANSETRON HCL 4 MG/2ML IJ SOLN
4.0000 mg | Freq: Once | INTRAMUSCULAR | Status: AC | PRN
  Administered 2019-06-27: 16:00:00 4 mg via INTRAVENOUS

## 2019-06-27 MED ORDER — EPHEDRINE SULFATE 50 MG/ML IJ SOLN
INTRAMUSCULAR | Status: DC | PRN
  Administered 2019-06-27 (×2): 10 mg via INTRAVENOUS
  Administered 2019-06-27 (×2): 15 mg via INTRAVENOUS

## 2019-06-27 MED ORDER — FENTANYL CITRATE (PF) 250 MCG/5ML IJ SOLN
INTRAMUSCULAR | Status: AC
  Filled 2019-06-27: qty 5

## 2019-06-27 MED ORDER — PHENYLEPHRINE HCL (PRESSORS) 10 MG/ML IV SOLN
INTRAVENOUS | Status: AC
  Filled 2019-06-27: qty 1

## 2019-06-27 MED ORDER — BUPIVACAINE HCL 0.5 % IJ SOLN
INTRAMUSCULAR | Status: DC | PRN
  Administered 2019-06-27: 10 mL

## 2019-06-27 MED ORDER — PROPOFOL 10 MG/ML IV BOLUS
INTRAVENOUS | Status: DC | PRN
  Administered 2019-06-27: 30 mg via INTRAVENOUS
  Administered 2019-06-27: 100 mg via INTRAVENOUS

## 2019-06-27 MED ORDER — MIDAZOLAM HCL 2 MG/2ML IJ SOLN
INTRAMUSCULAR | Status: AC
  Filled 2019-06-27: qty 2

## 2019-06-27 MED ORDER — LIDOCAINE HCL 4 % MT SOLN
OROMUCOSAL | Status: DC | PRN
  Administered 2019-06-27: 4 mL via TOPICAL

## 2019-06-27 MED ORDER — DEXAMETHASONE SODIUM PHOSPHATE 10 MG/ML IJ SOLN
INTRAMUSCULAR | Status: DC | PRN
  Administered 2019-06-27: 10 mg via INTRAVENOUS

## 2019-06-27 MED ORDER — ROCURONIUM BROMIDE 50 MG/5ML IV SOLN
INTRAVENOUS | Status: AC
  Filled 2019-06-27: qty 1

## 2019-06-27 MED ORDER — LIDOCAINE HCL (PF) 2 % IJ SOLN
INTRAMUSCULAR | Status: AC
  Filled 2019-06-27: qty 10

## 2019-06-27 MED ORDER — FENTANYL CITRATE (PF) 100 MCG/2ML IJ SOLN
INTRAMUSCULAR | Status: DC | PRN
  Administered 2019-06-27 (×2): 50 ug via INTRAVENOUS
  Administered 2019-06-27: 150 ug via INTRAVENOUS

## 2019-06-27 MED ORDER — THROMBIN 5000 UNITS EX SOLR
CUTANEOUS | Status: DC | PRN
  Administered 2019-06-27: 5000 [IU] via TOPICAL

## 2019-06-27 MED ORDER — DEXAMETHASONE SODIUM PHOSPHATE 10 MG/ML IJ SOLN
INTRAMUSCULAR | Status: AC
  Filled 2019-06-27: qty 1

## 2019-06-27 MED ORDER — SUCCINYLCHOLINE CHLORIDE 20 MG/ML IJ SOLN
INTRAMUSCULAR | Status: AC
  Filled 2019-06-27: qty 1

## 2019-06-27 MED ORDER — MIDAZOLAM HCL 2 MG/2ML IJ SOLN
INTRAMUSCULAR | Status: DC | PRN
  Administered 2019-06-27: 2 mg via INTRAVENOUS

## 2019-06-27 MED ORDER — SUCCINYLCHOLINE CHLORIDE 20 MG/ML IJ SOLN
INTRAMUSCULAR | Status: DC | PRN
  Administered 2019-06-27: 100 mg via INTRAVENOUS

## 2019-06-27 MED ORDER — SODIUM CHLORIDE 0.9 % IV SOLN
INTRAVENOUS | Status: DC | PRN
  Administered 2019-06-27: 50 ug/min via INTRAVENOUS

## 2019-06-27 MED ORDER — PHENYLEPHRINE HCL (PRESSORS) 10 MG/ML IV SOLN
INTRAVENOUS | Status: DC | PRN
  Administered 2019-06-27 (×2): 100 ug via INTRAVENOUS
  Administered 2019-06-27: 200 ug via INTRAVENOUS
  Administered 2019-06-27: 100 ug via INTRAVENOUS
  Administered 2019-06-27: 200 ug via INTRAVENOUS

## 2019-06-27 MED ORDER — ACETAMINOPHEN 10 MG/ML IV SOLN
INTRAVENOUS | Status: DC | PRN
  Administered 2019-06-27: 1000 mg via INTRAVENOUS

## 2019-06-27 MED ORDER — FAMOTIDINE 20 MG PO TABS
20.0000 mg | ORAL_TABLET | Freq: Once | ORAL | Status: AC
  Administered 2019-06-27: 13:00:00 20 mg via ORAL

## 2019-06-27 MED ORDER — CEFAZOLIN SODIUM-DEXTROSE 2-4 GM/100ML-% IV SOLN
2.0000 g | Freq: Once | INTRAVENOUS | Status: AC
  Administered 2019-06-27: 2 g via INTRAVENOUS

## 2019-06-27 MED ORDER — METHOCARBAMOL 500 MG PO TABS: 500.0000 mg | ORAL_TABLET | Freq: Three times a day (TID) | ORAL | 0 refills | Status: DC

## 2019-06-27 MED ORDER — KETAMINE HCL 50 MG/ML IJ SOLN
INTRAMUSCULAR | Status: DC | PRN
  Administered 2019-06-27: 50 mg via INTRAMUSCULAR

## 2019-06-27 MED ORDER — ROCURONIUM BROMIDE 100 MG/10ML IV SOLN
INTRAVENOUS | Status: DC | PRN
  Administered 2019-06-27: 5 mg via INTRAVENOUS

## 2019-06-27 MED ORDER — LIDOCAINE-EPINEPHRINE (PF) 1 %-1:200000 IJ SOLN
INTRAMUSCULAR | Status: AC
  Filled 2019-06-27: qty 30

## 2019-06-27 MED ORDER — LACTATED RINGERS IV SOLN
INTRAVENOUS | Status: DC
  Administered 2019-06-27: 13:00:00 via INTRAVENOUS

## 2019-06-27 MED ORDER — ONDANSETRON HCL 4 MG/2ML IJ SOLN
INTRAMUSCULAR | Status: AC
  Filled 2019-06-27: qty 2

## 2019-06-27 MED ORDER — PROPOFOL 10 MG/ML IV BOLUS
INTRAVENOUS | Status: AC
  Filled 2019-06-27: qty 20

## 2019-06-27 MED ORDER — OXYCODONE HCL 5 MG PO TABS: 5.0000 mg | ORAL_TABLET | Freq: Once | ORAL | Status: DC

## 2019-06-27 MED ORDER — THROMBIN 5000 UNITS EX SOLR
CUTANEOUS | Status: AC
  Filled 2019-06-27: qty 5000

## 2019-06-27 MED ORDER — BUPIVACAINE HCL (PF) 0.5 % IJ SOLN
INTRAMUSCULAR | Status: AC
  Filled 2019-06-27: qty 30

## 2019-06-27 MED ORDER — CEFAZOLIN SODIUM-DEXTROSE 2-4 GM/100ML-% IV SOLN
INTRAVENOUS | Status: AC
  Filled 2019-06-27: qty 100

## 2019-06-27 MED ORDER — FENTANYL CITRATE (PF) 100 MCG/2ML IJ SOLN: 25.0000 ug | INTRAMUSCULAR | Status: DC | PRN

## 2019-06-27 MED ORDER — METHYLPREDNISOLONE ACETATE 40 MG/ML IJ SUSP
INTRAMUSCULAR | Status: DC | PRN
  Administered 2019-06-27: 40 mg

## 2019-06-27 MED ORDER — FAMOTIDINE 20 MG PO TABS
ORAL_TABLET | ORAL | Status: AC
  Filled 2019-06-27: qty 1

## 2019-06-27 MED ORDER — METHYLPREDNISOLONE ACETATE 40 MG/ML IJ SUSP
INTRAMUSCULAR | Status: AC
  Filled 2019-06-27: qty 1

## 2019-06-27 SURGICAL SUPPLY — 58 items
BUR NEURO DRILL SOFT 3.0X3.8M (BURR) ×3 IMPLANT
CANISTER SUCT 1200ML W/VALVE (MISCELLANEOUS) ×6 IMPLANT
CHLORAPREP W/TINT 26 (MISCELLANEOUS) ×6 IMPLANT
COUNTER NEEDLE 20/40 LG (NEEDLE) ×3 IMPLANT
COVER LIGHT HANDLE STERIS (MISCELLANEOUS) ×6 IMPLANT
COVER WAND RF STERILE (DRAPES) ×3 IMPLANT
CUP MEDICINE 2OZ PLAST GRAD ST (MISCELLANEOUS) ×3 IMPLANT
DERMABOND ADVANCED (GAUZE/BANDAGES/DRESSINGS) ×2
DERMABOND ADVANCED .7 DNX12 (GAUZE/BANDAGES/DRESSINGS) ×1 IMPLANT
DRAPE C-ARM 42X72 X-RAY (DRAPES) ×6 IMPLANT
DRAPE LAPAROTOMY 100X77 ABD (DRAPES) ×3 IMPLANT
DRAPE MICROSCOPE SPINE 48X150 (DRAPES) IMPLANT
DRAPE SURG 17X11 SM STRL (DRAPES) ×3 IMPLANT
DRSG TEGADERM 4X4.75 (GAUZE/BANDAGES/DRESSINGS) IMPLANT
DRSG TELFA 4X3 1S NADH ST (GAUZE/BANDAGES/DRESSINGS) IMPLANT
DURASEAL APPLICATOR TIP (TIP) IMPLANT
DURASEAL SPINE SEALANT 3ML (MISCELLANEOUS) IMPLANT
ELECT CAUTERY BLADE TIP 2.5 (TIP) ×3
ELECT EZSTD 165MM 6.5IN (MISCELLANEOUS) ×3
ELECT REM PT RETURN 9FT ADLT (ELECTROSURGICAL) ×3
ELECTRODE CAUTERY BLDE TIP 2.5 (TIP) ×1 IMPLANT
ELECTRODE EZSTD 165MM 6.5IN (MISCELLANEOUS) ×1 IMPLANT
ELECTRODE REM PT RTRN 9FT ADLT (ELECTROSURGICAL) ×1 IMPLANT
GAUZE SPONGE 4X4 12PLY STRL (GAUZE/BANDAGES/DRESSINGS) ×3 IMPLANT
GLOVE BIOGEL PI IND STRL 7.0 (GLOVE) ×1 IMPLANT
GLOVE BIOGEL PI INDICATOR 7.0 (GLOVE) ×2
GLOVE INDICATOR 8.0 STRL GRN (GLOVE) ×9 IMPLANT
GLOVE SURG SYN 7.0 (GLOVE) ×6 IMPLANT
GLOVE SURG SYN 8.0 (GLOVE) ×3 IMPLANT
GOWN STRL REUS W/ TWL XL LVL3 (GOWN DISPOSABLE) ×1 IMPLANT
GOWN STRL REUS W/TWL MED LVL3 (GOWN DISPOSABLE) ×3 IMPLANT
GOWN STRL REUS W/TWL XL LVL3 (GOWN DISPOSABLE) ×2
GRADUATE 1200CC STRL 31836 (MISCELLANEOUS) ×3 IMPLANT
KIT TURNOVER KIT A (KITS) ×3 IMPLANT
KIT WILSON FRAME (KITS) ×3 IMPLANT
KNIFE BAYONET SHORT DISCETOMY (MISCELLANEOUS) ×3 IMPLANT
MARKER SKIN DUAL TIP RULER LAB (MISCELLANEOUS) ×6 IMPLANT
NDL SAFETY ECLIPSE 18X1.5 (NEEDLE) ×1 IMPLANT
NEEDLE HYPO 18GX1.5 SHARP (NEEDLE) ×2
NEEDLE HYPO 22GX1.5 SAFETY (NEEDLE) ×3 IMPLANT
NS IRRIG 1000ML POUR BTL (IV SOLUTION) ×3 IMPLANT
PACK LAMINECTOMY NEURO (CUSTOM PROCEDURE TRAY) ×3 IMPLANT
PAD ARMBOARD 7.5X6 YLW CONV (MISCELLANEOUS) ×3 IMPLANT
SPOGE SURGIFLO 8M (HEMOSTASIS) ×2
SPONGE SURGIFLO 8M (HEMOSTASIS) ×1 IMPLANT
STAPLER SKIN PROX 35W (STAPLE) IMPLANT
SUT NURALON 4 0 TR CR/8 (SUTURE) IMPLANT
SUT POLYSORB 2-0 5X18 GS-10 (SUTURE) ×3 IMPLANT
SUT VIC AB 0 CT1 18XCR BRD 8 (SUTURE) ×1 IMPLANT
SUT VIC AB 0 CT1 8-18 (SUTURE) ×2
SYR 10ML LL (SYRINGE) ×6 IMPLANT
SYR 30ML LL (SYRINGE) ×3 IMPLANT
SYR 3ML LL SCALE MARK (SYRINGE) ×3 IMPLANT
TOWEL OR 17X26 4PK STRL BLUE (TOWEL DISPOSABLE) ×12 IMPLANT
TUBE MATRX SPINL 18MM 6CM DISP (INSTRUMENTS) ×2
TUBE METRX SPINAL 18X6 DISP (INSTRUMENTS) ×1 IMPLANT
TUBING CONNECTING 10 (TUBING) ×2 IMPLANT
TUBING CONNECTING 10' (TUBING) ×1

## 2019-06-27 NOTE — Discharge Instructions (Addendum)
AMBULATORY SURGERY  DISCHARGE INSTRUCTIONS   1) The drugs that you were given will stay in your system until tomorrow so for the next 24 hours you should not:  A) Drive an automobile B) Make any legal decisions C) Drink any alcoholic beverage   2) You may resume regular meals tomorrow.  Today it is better to start with liquids and gradually work up to solid foods.  You may eat anything you prefer, but it is better to start with liquids, then soup and crackers, and gradually work up to solid foods.   3) Please notify your doctor immediately if you have any unusual bleeding, trouble breathing, redness and pain at the surgery site, drainage, fever, or pain not relieved by medication.    4) Additional Instructions:call for appt time        Please contact your physician with any problems or Same Day Surgery at [phone], Monday through Friday 6 am to 4 pm, or [place] [facility] number at [phone]. Your surgeon has performed an operation on your lumbar spine (low back) to relieve pressure on one or more nerves. Many times, patients feel better immediately after surgery and can overdo it. Even if you feel well, it is important that you follow these activity guidelines. If you do not let your back heal properly from the surgery, you can increase the chance of a disc herniation and/or return of your symptoms. The following are instructions to help in your recovery once you have been discharged from the hospital.  * Do not take anti-inflammatory medications for 3 days after surgery (naproxen [Aleve], ibuprofen [Advil, Motrin], celecoxib [Celebrex], etc.)  Activity    No bending, lifting, or twisting (BLT). Avoid lifting objects heavier than 10 pounds (gallon milk jug).  Where possible, avoid household activities that involve lifting, bending, pushing, or pulling such as laundry, vacuuming, grocery shopping, and childcare. Try to arrange for help from friends and family  for these activities while your back heals.  Increase physical activity slowly as tolerated.  Taking short walks is encouraged, but avoid strenuous exercise. Do not jog, run, bicycle, lift weights, or participate in any other exercises unless specifically allowed by your doctor. Avoid prolonged sitting, including car rides.  Talk to your doctor before resuming sexual activity.  You should not drive until cleared by your doctor.  Until released by your doctor, you should not return to work or school.  You should rest at home and let your body heal.   You may shower two days after your surgery.  After showering, lightly dab your incision dry. Do not take a tub bath or go swimming for 3 weeks, or until approved by your doctor at your follow-up appointment.  If you smoke, we strongly recommend that you quit.  Smoking has been proven to interfere with normal healing in your back and will dramatically reduce the success rate of your surgery. Please contact [facility] (800-QUIT-NOW) and use the resources at www.[facility].com for assistance in stopping smoking.  Surgical Incision   If you have a dressing on your incision, you may remove it three days after your surgery. Keep your incision area clean and dry.  If you have staples or stitches on your incision, you should have a follow up scheduled for removal. If you do not have staples or stitches, you will have steri-strips (small pieces of surgical tape) or Dermabond glue. The steri-strips/glue should begin to peel away within about a week (it is fine if the steri-strips  fall off before then). If the strips are still in place one week after your surgery, you may gently remove them.  Diet            You may return to your usual diet. Be sure to stay hydrated.  When to Contact us  Although your surgery and recovery will likely be uneventful, you may have some residual numbness, aches, and pains in your back and/or legs. This is normal and should  improve in the next few weeks.  However, should you experience any of the following, contact us immediately:  New numbness or weakness  Pain that is progressively getting worse, and is not relieved by your pain medications or rest  Bleeding, redness, swelling, pain, or drainage from surgical incision  Chills or flu-like symptoms  Fever greater than 101.0 F (38.3 C)  Problems with bowel or bladder functions  Difficulty breathing or shortness of breath  Warmth, tenderness, or swelling in your calf  Contact Information  During office hours (Monday-Friday 9 am to 5 pm), please call your physician at [phone]  After hours and weekends, please call the [facility] Operator at [phone] and ask for the Neurosurgery Resident On Call   For a life-threatening emergency, call 911

## 2019-06-27 NOTE — Anesthesia Preprocedure Evaluation (Signed)
Anesthesia Evaluation  Patient identified by MRN, date of birth, ID band Patient awake    Reviewed: Allergy & Precautions, NPO status , Patient's Chart, lab work & pertinent test results  Airway Mallampati: II       Dental   Pulmonary former smoker,    Pulmonary exam normal        Cardiovascular negative cardio ROS Normal cardiovascular exam     Neuro/Psych PSYCHIATRIC DISORDERS Depression negative neurological ROS     GI/Hepatic negative GI ROS, Neg liver ROS,   Endo/Other  negative endocrine ROS  Renal/GU negative Renal ROS  negative genitourinary   Musculoskeletal  (+) Arthritis , Osteoarthritis,    Abdominal Normal abdominal exam  (+)   Peds negative pediatric ROS (+)  Hematology negative hematology ROS (+)   Anesthesia Other Findings Past Medical History: No date: Arthritis No date: Depression  Reproductive/Obstetrics                             Anesthesia Physical Anesthesia Plan  ASA: II  Anesthesia Plan: General   Post-op Pain Management:    Induction: Intravenous  PONV Risk Score and Plan:   Airway Management Planned: Oral ETT  Additional Equipment:   Intra-op Plan:   Post-operative Plan: Extubation in OR  Informed Consent: I have reviewed the patients History and Physical, chart, labs and discussed the procedure including the risks, benefits and alternatives for the proposed anesthesia with the patient or authorized representative who has indicated his/her understanding and acceptance.       Plan Discussed with: CRNA and Surgeon  Anesthesia Plan Comments:         Anesthesia Quick Evaluation

## 2019-06-27 NOTE — H&P (Signed)
Jose Bender is an 73 y.o. male.   Chief Complaint: Right leg pain HPI: Jose Bender is here for evaluation of ongoing symptoms of right leg pain that he states is been going on approximate 7 to 8 weeks. It became more severe about a month ago prompted an ED visit. He states that he has noticed some slight improvement with the gabapentin and muscle relaxant but this is also affecting his ability to focus. He has been to physical therapy. He was previously on a steroid taper after the ED visit. He does note some numbness in the lateral side of his leg going into the foot. He does feel like his right leg is weaker than his left. He is also having the pain that will travel into his thigh and towards his knee. He denies any of the symptoms in the left leg. He does feel like the current symptoms are affecting his ability to perform full activity. He does note some prior issues with his back, however this is more of an acute issue. He had a MRI of the lumbar spine performed showing a right L3/4 disc fragment migrated behind L3 body. We discussed lumbar decompression for discectomy and he wishes to proceed.    Past Medical History:  Diagnosis Date  . Arthritis   . Depression     Past Surgical History:  Procedure Laterality Date  . EYE SURGERY Bilateral    cataracts  . KNEE ARTHROSCOPY Left     History reviewed. No pertinent family history. Social History:  reports that he quit smoking about 15 years ago. He has never used smokeless tobacco. He reports current alcohol use. He reports that he does not use drugs.  Allergies:  Allergies  Allergen Reactions  . Codeine Nausea Only    Medications Prior to Admission  Medication Sig Dispense Refill  . acetaminophen (TYLENOL) 500 MG tablet Take 500 mg by mouth every 8 (eight) hours as needed (pain).     Marland Kitchen gabapentin (NEURONTIN) 300 MG capsule Take 300 mg by mouth 3 (three) times daily.    . methocarbamol (ROBAXIN) 500 MG tablet Take 500 mg by mouth  3 (three) times daily.    . sertraline (ZOLOFT) 100 MG tablet Take 150 mg by mouth daily.      Results for orders placed or performed during the hospital encounter of 06/27/19 (from the past 48 hour(s))  ABO/Rh     Status: None (Preliminary result)   Collection Time: 06/27/19 12:28 PM  Result Value Ref Range   ABO/RH(D) PENDING    No results found.  ROS General ROS: Negative Psychological ROS: Negative Ophthalmic ROS: Negative ENT ROS: Negative Hematological and Lymphatic ROS: Negative  Endocrine ROS: Negative Respiratory ROS: Negative Cardiovascular ROS: Negative Gastrointestinal ROS: Negative Genito-Urinary ROS: Negative Musculoskeletal ROS: Negative Neurological ROS: Positive for right leg pain, numbness Dermatological ROS: Negative  Blood pressure 134/88, pulse 95, temperature 97.9 F (36.6 C), resp. rate 18, height 5\' 9"  (1.753 m), weight 89.5 kg, SpO2 99 %. Physical Exam  General appearance: Alert, cooperative, in no acute distress Head: Normocephalic, atraumatic Eyes: Normal, EOM intact Oropharynx: Moist without lesions Back: No tenderness to palpation of the midline or paramedian region in the lumbar spine CV: Regular rate and rhythm Pulm: Clear to auscultation Ext: No edema in LE bilaterally  Neurologic exam:  Mental status: alertness: alert, affect: normal Speech: fluent and clear Motor:strength symmetric 5/5 in bilateral hip flexion, knee flexion, knee extension, dorsiflexion, plantarflexion Sensory: Decreased to light touch over  the lateral leg and foot on the right Reflexes: 2+ and symmetric bilaterally for patella and ankle Gait: Slowed gait, mildly antalgic   Imaging: MRI lumbar spine: There is straightening of the lordotic curvature. There is a normal alignment. There is severe degenerative disease noted at L1-2 and L2-3. There is a noted disc fragment that has migrated posterior to the L3 body beneath the L2 disc space which causes lateral recess  stenosis and displacement of the exiting L3 nerve root. There is some mild foraminal stenosis at L2-3 on the left. There are no other significant areas of stenosis noted.   Assessment/Plan  1. Diagnosis: Right  L3/4 radiculopathy  2. Plan     L3/4 Hemilaminectomy and discectomy on right  Deetta Perla, MD 06/27/2019, 12:51 PM

## 2019-06-27 NOTE — Anesthesia Post-op Follow-up Note (Signed)
Anesthesia QCDR form completed.        

## 2019-06-27 NOTE — Progress Notes (Signed)
Pharmacist Physician Communication  Consult for pharmacy to dose cefazolin for surgical prophylaxis. Order for cefazolin 2 g IV x 1 based on patient weight < 120 kg. Dose to be given within 60 minutes of surgical incision.  Dorena Bodo, PharmD

## 2019-06-27 NOTE — Op Note (Signed)
Operative Note  SURGERY DATE:06/27/2019  PRE-OP DIAGNOSIS: Lumbar Stenosis withLumbar Radiculopathy(m48.062)  POST-OP DIAGNOSIS:Post-Op Diagnosis Codes: Lumbar Stenosis withLumbar Radiculopathy(m48.062)  Procedure(s) with comments: RightL3/4HemilaminectomywithDiscectomy  SURGEON:  * Malen Gauze, MD Marin Olp, PA Assistant  ANESTHESIA:General  OPERATIVE FINDINGS: Lateral recess stenosisat right L3/4with disc herniation  OPERATIVE REPORT:   Indication: Mr. Marohl presented to clinic on8/4with ongoingrightleg painpreventing movement.Hehad failed conservative management of steroids,prescription medications, and therapyand the symptoms were affecting hislifestyle. MRI revealed right L3/4stenosis compressingthetraversingnerve root from a disc herniation that had migrated behind body.Therisks of surgery were explained to include hematoma, infection, damage to nerve roots, CSF leak, weakness, numbness, pain, need for future surgery including fusion, heart attack, and stroke.Heelected to proceed with surgery for symptom relief.   Procedure The patient was brought to the OR after informed consent was obtained.He was given general anesthesia and intubated by the anesthesia service. Vascular access lines were placed.The patient was then placed prone on a Wilson frameensuring all pressure points were padded.Antibiotics were administered.A time-out was performed per protocol.   The patient was sterilely prepped and draped. Fluoroscopy confirmedL3/4interspaceandanincision was planned1.5cm off midlineon theright.The incision was instilled withlocal anesthetic with epinephrine. The skin was opened sharply and the dissection taken to the fascia. This was incised and initial dilator placedthe spinous processes and lamina of L3on therightout to the medial edge of the facet. Serial dilatorswere inserted via fluoroscopy and  the final27mm tube was placed at depth of6cm.  The microscope was brought into the field. The overlying muscle was removed from lamina and medial facet.Next, a matchstickdrill bit was used to remove theL3lamina centrallyand going laterallyon both sides.A medial facetectomy was performed on theright. We did remove lamina up to the level of the pedicle at [facility] underlying ligament was freed and removed with combination of rongeurs. The decompression was taken caudal to the superior border ofL4. The dura was seen to be full and intact.Once all ligament and soft tissue was removed, attention was turned to inspection of the nerve root. The nerve rootwas displacedposteriorlydue to a firmdisc bulgeand thecal sacwas retractedmedially.  The longitudinal ligamnet was coagulated and entered sharply. We then used a combination of curettes and probes to remove some small soft pieces of disc in the area of the axilla of the L3 nerve root,  next to the pedicle, and going towards the foramen. The epidural space was inspected and no additional compression could be seen. Multiple other small pieces were removed using a blunt instrument to check the epidural space and out the foramen.The dura and nerve root was seen to be free at this point. The epidural space was inspected and the nerve root followed laterally withoutany signs of compression. The space was irrigated ensuring no free disc fragment.The disc space was seen to be flat.  Multiple rounds of irrigation were used. Hemostasis was obtained.Depomedrol was placed along the nerve root.The microscope was removed.  Themuscle andfasciawas then closed using 0and 2-0vicryl followed by thesubcutaneous and dermal layers with 2-0 vicryluntil the epidermis was well approximated. The skin was closed withDermabond..  The patient was returned to supine position and extubated by the anesthesia service. The patient was then taken to the  PACU for post-operative care whereshe was moving extremities symmetrically.   ESTIMATED BLOOD LOSS: 20cc  SPECIMENS None  IMPLANT None   I performed the case in its entiretywith assistance of PA, Corrie Mckusick, [place]

## 2019-06-27 NOTE — Progress Notes (Signed)
Pt ambulated around unit, pt unable to void , Dr Lacinda Axon aware and patient understands he is to return to ED at 10 pm if unable to void

## 2019-06-27 NOTE — Discharge Summary (Signed)
  Procedure: L3-4 lumbar decompression and microdiscectomy Procedure date: 06/27/2019 Diagnosis: Lumbar radiculopathy  History: Feras Gardella is s/p L3-4 lumbar decompression for lumbar radiculopathy  POD0: Tolerated procedure well without complication.  Seen in postop recovery still disoriented from anesthesia but able to answer questions and obey commands. Improvement in right lower extremity pain. Denies any new lower extremity pain/numbness/tingling or back pain.  Physical Exam: Vitals:   06/27/19 1556 06/27/19 1607  BP:    Pulse: (!) 109 (!) 101  Resp: (!) 39 15  Temp:    SpO2: 100% 96%   Strength:5/5 throughout right lower extremity Sensation: Intact throughout lower extremities bilaterally   Data:  Recent Labs  Lab 06/23/19 1449  NA 140  K 3.6  CL 107  CO2 23  BUN 18  CREATININE 1.06  GLUCOSE 108*  CALCIUM 9.0   No results for input(s): AST, ALT, ALKPHOS in the last 168 hours.  Invalid input(s): TBILI   Recent Labs  Lab 06/23/19 1449  WBC 9.4  HGB 14.4  HCT 43.7  PLT 253   Recent Labs  Lab 06/23/19 1449  APTT 33  INR 1.0         Other tests/results: No imaging reviewed  Assessment/Plan:  Resean Brander is POD 0 status post L3-4 lumbar decompression for lumbar radiculopathy.  Symptoms that were present prior to surgery seem to be improved.  We will continue postop pain control with oxycodone and Robaxin as needed.  He is scheduled to follow-up in clinic in approximately 2 weeks to monitor progress.  Advised to contact office if any questions or concerns arise before then.  Marin Olp PA-C Department of Neurosurgery

## 2019-06-27 NOTE — Transfer of Care (Signed)
Immediate Anesthesia Transfer of Care Note  Patient: Jose Bender  Procedure(s) Performed: Procedure(s): L3-4 HEMILAMINECTOMY DISCECTOMY (N/A)  Patient Location: PACU  Anesthesia Type:General  Level of Consciousness: sedated  Airway & Oxygen Therapy: Patient Spontanous Breathing and Patient connected to face mask oxygen  Post-op Assessment: Report given to RN and Post -op Vital signs reviewed and stable  Post vital signs: Reviewed and stable  Last Vitals:  Vitals:   06/27/19 1242 06/27/19 1537  BP: 134/88 118/70  Pulse: 95 95  Resp: 18 11  Temp: 36.6 C 36.9 C  SpO2: 38% 93%    Complications: No apparent anesthesia complications

## 2019-06-27 NOTE — Anesthesia Procedure Notes (Signed)
Procedure Name: Intubation Date/Time: 06/27/2019 1:35 PM Performed by: Eben Burow, CRNA Pre-anesthesia Checklist: Patient identified, Emergency Drugs available, Suction available and Patient being monitored Patient Re-evaluated:Patient Re-evaluated prior to induction Oxygen Delivery Method: Circle system utilized Preoxygenation: Pre-oxygenation with 100% oxygen Induction Type: IV induction Ventilation: Mask ventilation without difficulty Laryngoscope Size: Miller and 2 Grade View: Grade I Tube type: Oral Tube size: 7.5 mm Number of attempts: 1 Airway Equipment and Method: Stylet and LTA kit utilized Placement Confirmation: ETT inserted through vocal cords under direct vision,  positive ETCO2 and breath sounds checked- equal and bilateral Secured at: 23 cm Tube secured with: Tape Dental Injury: Teeth and Oropharynx as per pre-operative assessment

## 2019-06-27 NOTE — Interval H&P Note (Signed)
History and Physical Interval Note:  06/27/2019 12:55 PM  Jose Bender  has presented today for surgery, with the diagnosis of lumbar radiculopathy m54.16.  The various methods of treatment have been discussed with the patient and family. After consideration of risks, benefits and other options for treatment, the patient has consented to  Procedure(s): L3-4 HEMILAMINECTOMY DISCECTOMY (N/A) as a surgical intervention.  The patient's history has been reviewed, patient examined, no change in status, stable for surgery.  I have reviewed the patient's chart and labs.  Questions were answered to the patient's satisfaction.     Deetta Perla

## 2019-06-28 ENCOUNTER — Encounter: Payer: Self-pay | Admitting: Neurosurgery

## 2019-06-28 NOTE — Anesthesia Postprocedure Evaluation (Signed)
Anesthesia Post Note  Patient: Jose Bender  Procedure(s) Performed: L3-4 HEMILAMINECTOMY DISCECTOMY (N/A Back)  Patient location during evaluation: PACU Anesthesia Type: General Level of consciousness: awake and alert and oriented Pain management: pain level controlled Vital Signs Assessment: post-procedure vital signs reviewed and stable Respiratory status: spontaneous breathing Cardiovascular status: blood pressure returned to baseline Anesthetic complications: no     Last Vitals:  Vitals:   06/27/19 1652 06/27/19 1723  BP: (!) 143/86 122/75  Pulse: 79 80  Resp: 14 20  Temp: (!) 36.1 C (!) 36 C  SpO2: 95% 99%    Last Pain:  Vitals:   06/27/19 1723  TempSrc: Temporal  PainSc: 5                  [name]

## 2019-06-30 DIAGNOSIS — K589 Irritable bowel syndrome without diarrhea: Secondary | ICD-10-CM | POA: Diagnosis not present

## 2019-06-30 DIAGNOSIS — M79604 Pain in right leg: Secondary | ICD-10-CM | POA: Diagnosis not present

## 2019-06-30 DIAGNOSIS — Z4789 Encounter for other orthopedic aftercare: Secondary | ICD-10-CM | POA: Diagnosis not present

## 2019-06-30 DIAGNOSIS — M5416 Radiculopathy, lumbar region: Secondary | ICD-10-CM | POA: Diagnosis not present

## 2019-06-30 DIAGNOSIS — R2681 Unsteadiness on feet: Secondary | ICD-10-CM | POA: Diagnosis not present

## 2019-06-30 DIAGNOSIS — F419 Anxiety disorder, unspecified: Secondary | ICD-10-CM | POA: Diagnosis not present

## 2019-06-30 DIAGNOSIS — E785 Hyperlipidemia, unspecified: Secondary | ICD-10-CM | POA: Diagnosis not present

## 2019-06-30 DIAGNOSIS — F329 Major depressive disorder, single episode, unspecified: Secondary | ICD-10-CM | POA: Diagnosis not present

## 2019-07-05 DIAGNOSIS — M79604 Pain in right leg: Secondary | ICD-10-CM | POA: Diagnosis not present

## 2019-07-05 DIAGNOSIS — R2681 Unsteadiness on feet: Secondary | ICD-10-CM | POA: Diagnosis not present

## 2019-07-05 DIAGNOSIS — Z4789 Encounter for other orthopedic aftercare: Secondary | ICD-10-CM | POA: Diagnosis not present

## 2019-07-05 DIAGNOSIS — M5416 Radiculopathy, lumbar region: Secondary | ICD-10-CM | POA: Diagnosis not present

## 2019-07-05 DIAGNOSIS — K589 Irritable bowel syndrome without diarrhea: Secondary | ICD-10-CM | POA: Diagnosis not present

## 2019-07-05 DIAGNOSIS — F419 Anxiety disorder, unspecified: Secondary | ICD-10-CM | POA: Diagnosis not present

## 2019-07-08 DIAGNOSIS — R2681 Unsteadiness on feet: Secondary | ICD-10-CM | POA: Diagnosis not present

## 2019-07-08 DIAGNOSIS — F419 Anxiety disorder, unspecified: Secondary | ICD-10-CM | POA: Diagnosis not present

## 2019-07-08 DIAGNOSIS — K589 Irritable bowel syndrome without diarrhea: Secondary | ICD-10-CM | POA: Diagnosis not present

## 2019-07-08 DIAGNOSIS — M79604 Pain in right leg: Secondary | ICD-10-CM | POA: Diagnosis not present

## 2019-07-08 DIAGNOSIS — Z4789 Encounter for other orthopedic aftercare: Secondary | ICD-10-CM | POA: Diagnosis not present

## 2019-07-08 DIAGNOSIS — M5416 Radiculopathy, lumbar region: Secondary | ICD-10-CM | POA: Diagnosis not present

## 2019-07-11 DIAGNOSIS — R2681 Unsteadiness on feet: Secondary | ICD-10-CM | POA: Diagnosis not present

## 2019-07-11 DIAGNOSIS — M79604 Pain in right leg: Secondary | ICD-10-CM | POA: Diagnosis not present

## 2019-07-11 DIAGNOSIS — Z4789 Encounter for other orthopedic aftercare: Secondary | ICD-10-CM | POA: Diagnosis not present

## 2019-07-11 DIAGNOSIS — M5416 Radiculopathy, lumbar region: Secondary | ICD-10-CM | POA: Diagnosis not present

## 2019-07-11 DIAGNOSIS — F419 Anxiety disorder, unspecified: Secondary | ICD-10-CM | POA: Diagnosis not present

## 2019-07-11 DIAGNOSIS — K589 Irritable bowel syndrome without diarrhea: Secondary | ICD-10-CM | POA: Diagnosis not present

## 2019-07-13 DIAGNOSIS — F410 Panic disorder [episodic paroxysmal anxiety] without agoraphobia: Secondary | ICD-10-CM | POA: Diagnosis not present

## 2019-07-13 DIAGNOSIS — F331 Major depressive disorder, recurrent, moderate: Secondary | ICD-10-CM | POA: Diagnosis not present

## 2019-07-14 ENCOUNTER — Encounter: Payer: Self-pay | Admitting: Neurosurgery

## 2019-07-14 DIAGNOSIS — Z4789 Encounter for other orthopedic aftercare: Secondary | ICD-10-CM | POA: Diagnosis not present

## 2019-07-14 DIAGNOSIS — R2681 Unsteadiness on feet: Secondary | ICD-10-CM | POA: Diagnosis not present

## 2019-07-14 DIAGNOSIS — F419 Anxiety disorder, unspecified: Secondary | ICD-10-CM | POA: Diagnosis not present

## 2019-07-14 DIAGNOSIS — M5416 Radiculopathy, lumbar region: Secondary | ICD-10-CM | POA: Diagnosis not present

## 2019-07-14 DIAGNOSIS — M79604 Pain in right leg: Secondary | ICD-10-CM | POA: Diagnosis not present

## 2019-07-14 DIAGNOSIS — K589 Irritable bowel syndrome without diarrhea: Secondary | ICD-10-CM | POA: Diagnosis not present

## 2019-07-20 DIAGNOSIS — Z4789 Encounter for other orthopedic aftercare: Secondary | ICD-10-CM | POA: Diagnosis not present

## 2019-07-20 DIAGNOSIS — F419 Anxiety disorder, unspecified: Secondary | ICD-10-CM | POA: Diagnosis not present

## 2019-07-20 DIAGNOSIS — F410 Panic disorder [episodic paroxysmal anxiety] without agoraphobia: Secondary | ICD-10-CM | POA: Diagnosis not present

## 2019-07-20 DIAGNOSIS — K589 Irritable bowel syndrome without diarrhea: Secondary | ICD-10-CM | POA: Diagnosis not present

## 2019-07-20 DIAGNOSIS — F331 Major depressive disorder, recurrent, moderate: Secondary | ICD-10-CM | POA: Diagnosis not present

## 2019-07-20 DIAGNOSIS — M79604 Pain in right leg: Secondary | ICD-10-CM | POA: Diagnosis not present

## 2019-07-20 DIAGNOSIS — M5416 Radiculopathy, lumbar region: Secondary | ICD-10-CM | POA: Diagnosis not present

## 2019-07-20 DIAGNOSIS — R2681 Unsteadiness on feet: Secondary | ICD-10-CM | POA: Diagnosis not present

## 2019-07-27 ENCOUNTER — Other Ambulatory Visit: Payer: Self-pay | Admitting: Neurosurgery

## 2019-07-27 DIAGNOSIS — M546 Pain in thoracic spine: Secondary | ICD-10-CM

## 2019-07-27 DIAGNOSIS — F331 Major depressive disorder, recurrent, moderate: Secondary | ICD-10-CM | POA: Diagnosis not present

## 2019-07-28 DIAGNOSIS — R2681 Unsteadiness on feet: Secondary | ICD-10-CM | POA: Diagnosis not present

## 2019-07-28 DIAGNOSIS — K589 Irritable bowel syndrome without diarrhea: Secondary | ICD-10-CM | POA: Diagnosis not present

## 2019-07-28 DIAGNOSIS — M5416 Radiculopathy, lumbar region: Secondary | ICD-10-CM | POA: Diagnosis not present

## 2019-07-28 DIAGNOSIS — Z4789 Encounter for other orthopedic aftercare: Secondary | ICD-10-CM | POA: Diagnosis not present

## 2019-07-28 DIAGNOSIS — F419 Anxiety disorder, unspecified: Secondary | ICD-10-CM | POA: Diagnosis not present

## 2019-07-28 DIAGNOSIS — M79604 Pain in right leg: Secondary | ICD-10-CM | POA: Diagnosis not present

## 2019-08-03 DIAGNOSIS — F331 Major depressive disorder, recurrent, moderate: Secondary | ICD-10-CM | POA: Diagnosis not present

## 2019-08-03 DIAGNOSIS — F410 Panic disorder [episodic paroxysmal anxiety] without agoraphobia: Secondary | ICD-10-CM | POA: Diagnosis not present

## 2019-08-10 ENCOUNTER — Inpatient Hospital Stay (HOSPITAL_COMMUNITY)
Admission: EM | Admit: 2019-08-10 | Discharge: 2019-08-13 | DRG: 381 | Disposition: A | Discharge status: Home / Self Care | Payer: Medicare Other | Attending: Internal Medicine | Admitting: Internal Medicine

## 2019-08-10 ENCOUNTER — Emergency Department (HOSPITAL_COMMUNITY): Payer: Medicare Other

## 2019-08-10 ENCOUNTER — Other Ambulatory Visit: Payer: Self-pay

## 2019-08-10 ENCOUNTER — Encounter (HOSPITAL_COMMUNITY): Payer: Self-pay | Admitting: Emergency Medicine

## 2019-08-10 DIAGNOSIS — R935 Abnormal findings on diagnostic imaging of other abdominal regions, including retroperitoneum: Secondary | ICD-10-CM | POA: Diagnosis not present

## 2019-08-10 DIAGNOSIS — K2970 Gastritis, unspecified, without bleeding: Secondary | ICD-10-CM | POA: Diagnosis present

## 2019-08-10 DIAGNOSIS — G8929 Other chronic pain: Secondary | ICD-10-CM | POA: Diagnosis present

## 2019-08-10 DIAGNOSIS — K2211 Ulcer of esophagus with bleeding: Principal | ICD-10-CM | POA: Diagnosis present

## 2019-08-10 DIAGNOSIS — D500 Iron deficiency anemia secondary to blood loss (chronic): Secondary | ICD-10-CM | POA: Diagnosis not present

## 2019-08-10 DIAGNOSIS — M545 Low back pain, unspecified: Secondary | ICD-10-CM | POA: Diagnosis present

## 2019-08-10 DIAGNOSIS — F329 Major depressive disorder, single episode, unspecified: Secondary | ICD-10-CM | POA: Diagnosis present

## 2019-08-10 DIAGNOSIS — E876 Hypokalemia: Secondary | ICD-10-CM

## 2019-08-10 DIAGNOSIS — F410 Panic disorder [episodic paroxysmal anxiety] without agoraphobia: Secondary | ICD-10-CM | POA: Diagnosis not present

## 2019-08-10 DIAGNOSIS — D649 Anemia, unspecified: Secondary | ICD-10-CM

## 2019-08-10 DIAGNOSIS — Z7289 Other problems related to lifestyle: Secondary | ICD-10-CM

## 2019-08-10 DIAGNOSIS — K921 Melena: Secondary | ICD-10-CM | POA: Diagnosis not present

## 2019-08-10 DIAGNOSIS — M1990 Unspecified osteoarthritis, unspecified site: Secondary | ICD-10-CM | POA: Diagnosis present

## 2019-08-10 DIAGNOSIS — Z20828 Contact with and (suspected) exposure to other viral communicable diseases: Secondary | ICD-10-CM | POA: Diagnosis present

## 2019-08-10 DIAGNOSIS — K920 Hematemesis: Secondary | ICD-10-CM | POA: Diagnosis not present

## 2019-08-10 DIAGNOSIS — K5712 Diverticulitis of small intestine without perforation or abscess without bleeding: Secondary | ICD-10-CM | POA: Diagnosis present

## 2019-08-10 DIAGNOSIS — Z87891 Personal history of nicotine dependence: Secondary | ICD-10-CM

## 2019-08-10 DIAGNOSIS — K922 Gastrointestinal hemorrhage, unspecified: Secondary | ICD-10-CM | POA: Diagnosis present

## 2019-08-10 DIAGNOSIS — D62 Acute posthemorrhagic anemia: Secondary | ICD-10-CM | POA: Diagnosis present

## 2019-08-10 DIAGNOSIS — K5792 Diverticulitis of intestine, part unspecified, without perforation or abscess without bleeding: Secondary | ICD-10-CM | POA: Diagnosis not present

## 2019-08-10 DIAGNOSIS — K5710 Diverticulosis of small intestine without perforation or abscess without bleeding: Secondary | ICD-10-CM | POA: Diagnosis not present

## 2019-08-10 DIAGNOSIS — R197 Diarrhea, unspecified: Secondary | ICD-10-CM | POA: Diagnosis not present

## 2019-08-10 DIAGNOSIS — K269 Duodenal ulcer, unspecified as acute or chronic, without hemorrhage or perforation: Secondary | ICD-10-CM | POA: Diagnosis present

## 2019-08-10 DIAGNOSIS — Z885 Allergy status to narcotic agent status: Secondary | ICD-10-CM

## 2019-08-10 DIAGNOSIS — K2980 Duodenitis without bleeding: Secondary | ICD-10-CM | POA: Diagnosis not present

## 2019-08-10 DIAGNOSIS — Z9842 Cataract extraction status, left eye: Secondary | ICD-10-CM

## 2019-08-10 DIAGNOSIS — Z79899 Other long term (current) drug therapy: Secondary | ICD-10-CM

## 2019-08-10 DIAGNOSIS — F331 Major depressive disorder, recurrent, moderate: Secondary | ICD-10-CM | POA: Diagnosis not present

## 2019-08-10 DIAGNOSIS — Z9841 Cataract extraction status, right eye: Secondary | ICD-10-CM

## 2019-08-10 DIAGNOSIS — K449 Diaphragmatic hernia without obstruction or gangrene: Secondary | ICD-10-CM | POA: Diagnosis present

## 2019-08-10 LAB — ABO/RH: ABO/RH(D): O POS

## 2019-08-10 LAB — CBC
HCT: 33.4 % — ABNORMAL LOW (ref 39.0–52.0)
Hemoglobin: 11.1 g/dL — ABNORMAL LOW (ref 13.0–17.0)
MCH: 31.8 pg (ref 26.0–34.0)
MCHC: 33.2 g/dL (ref 30.0–36.0)
MCV: 95.7 fL (ref 80.0–100.0)
Platelets: 367 10*3/uL (ref 150–400)
RBC: 3.49 MIL/uL — ABNORMAL LOW (ref 4.22–5.81)
RDW: 15.6 % — ABNORMAL HIGH (ref 11.5–15.5)
WBC: 9.6 10*3/uL (ref 4.0–10.5)
nRBC: 0 % (ref 0.0–0.2)

## 2019-08-10 LAB — TYPE AND SCREEN
ABO/RH(D): O POS
Antibody Screen: NEGATIVE

## 2019-08-10 LAB — LIPASE, BLOOD: Lipase: 36 U/L (ref 11–51)

## 2019-08-10 LAB — COMPREHENSIVE METABOLIC PANEL
ALT: 15 U/L (ref 0–44)
AST: 19 U/L (ref 15–41)
Albumin: 3.7 g/dL (ref 3.5–5.0)
Alkaline Phosphatase: 51 U/L (ref 38–126)
Anion gap: 15 (ref 5–15)
BUN: 25 mg/dL — ABNORMAL HIGH (ref 8–23)
CO2: 18 mmol/L — ABNORMAL LOW (ref 22–32)
Calcium: 9 mg/dL (ref 8.9–10.3)
Chloride: 102 mmol/L (ref 98–111)
Creatinine, Ser: 1.17 mg/dL (ref 0.61–1.24)
GFR calc Af Amer: >60 mL/min (ref >60)
GFR calc non Af Amer: >60 mL/min (ref >60)
Glucose, Bld: 134 mg/dL — ABNORMAL HIGH (ref 70–99)
Potassium: 2.9 mmol/L — ABNORMAL LOW (ref 3.5–5.1)
Sodium: 135 mmol/L (ref 135–145)
Total Bilirubin: 0.8 mg/dL (ref 0.3–1.2)
Total Protein: 6.7 g/dL (ref 6.5–8.1)

## 2019-08-10 LAB — POC OCCULT BLOOD, ED: Fecal Occult Bld: POSITIVE — AB

## 2019-08-10 MED ORDER — IOHEXOL 300 MG/ML  SOLN
100.0000 mL | Freq: Once | INTRAMUSCULAR | Status: AC | PRN
  Administered 2019-08-10: 22:00:00 100 mL via INTRAVENOUS

## 2019-08-10 MED ORDER — FENTANYL CITRATE (PF) 100 MCG/2ML IJ SOLN
50.0000 ug | Freq: Once | INTRAMUSCULAR | Status: AC
  Administered 2019-08-10: 22:00:00 50 ug via INTRAVENOUS
  Filled 2019-08-10: qty 2

## 2019-08-10 MED ORDER — ACETAMINOPHEN 325 MG PO TABS: 650.0000 mg | ORAL_TABLET | Freq: Four times a day (QID) | ORAL | Status: DC | PRN

## 2019-08-10 MED ORDER — POTASSIUM CHLORIDE IN NACL 40-0.9 MEQ/L-% IV SOLN
INTRAVENOUS | Status: AC
  Administered 2019-08-11: 125 mL/h via INTRAVENOUS
  Filled 2019-08-10: qty 1000

## 2019-08-10 MED ORDER — SODIUM CHLORIDE 0.9 % IV BOLUS
1000.0000 mL | Freq: Once | INTRAVENOUS | Status: AC
  Administered 2019-08-10: 22:00:00 1000 mL via INTRAVENOUS

## 2019-08-10 MED ORDER — PANTOPRAZOLE SODIUM 40 MG IV SOLR
40.0000 mg | Freq: Two times a day (BID) | INTRAVENOUS | Status: DC
  Administered 2019-08-11 – 2019-08-13 (×6): 40 mg via INTRAVENOUS
  Filled 2019-08-10 (×6): qty 40

## 2019-08-10 MED ORDER — PIPERACILLIN-TAZOBACTAM 3.375 G IVPB 30 MIN
3.3750 g | Freq: Once | INTRAVENOUS | Status: AC
  Administered 2019-08-10: 3.375 g via INTRAVENOUS
  Filled 2019-08-10: qty 50

## 2019-08-10 MED ORDER — MAGNESIUM SULFATE IN D5W 1-5 GM/100ML-% IV SOLN
1.0000 g | Freq: Once | INTRAVENOUS | Status: AC
  Administered 2019-08-11: 03:00:00 1 g via INTRAVENOUS
  Filled 2019-08-10: qty 100

## 2019-08-10 MED ORDER — ACETAMINOPHEN 650 MG RE SUPP: 650.0000 mg | Freq: Four times a day (QID) | RECTAL | Status: DC | PRN

## 2019-08-10 MED ORDER — FENTANYL CITRATE (PF) 100 MCG/2ML IJ SOLN
25.0000 ug | INTRAMUSCULAR | Status: DC | PRN
  Administered 2019-08-11 – 2019-08-12 (×4): 50 ug via INTRAVENOUS
  Filled 2019-08-10 (×5): qty 2

## 2019-08-10 MED ORDER — POTASSIUM CHLORIDE 10 MEQ/100ML IV SOLN
10.0000 meq | INTRAVENOUS | Status: AC
  Administered 2019-08-10: 10 meq via INTRAVENOUS
  Filled 2019-08-10: qty 100

## 2019-08-10 NOTE — ED Provider Notes (Signed)
[facility] EMERGENCY DEPARTMENT Provider Note   CSN: [id number] Arrival date & time: 08/10/19  1622     History   Chief Complaint Chief Complaint  Patient presents with  . Abdominal Pain    HPI Jose Bender is a 73 y.o. male w PMHx arthritis, recent L3 surgery in August, presenting to the ED with complaint of dark tarry stools Nam generalized upper abdominal pain x3 weeks.  Patient states all of his stools have been really dark and somewhat loose over the last 3 weeks.  He is having intermittent sharp upper abdominal pains as well.  No associated fever, nausea, vomiting, urinary symptoms, weakness or lightheadedness.  He states his back pain with right-sided radiculopathy has much improved since his surgery by Dr. Lacinda Axon at [facility].  Though he states he feels as though he has been having decreased appetite since the surgery.  He has been using his leftover oxycodone and taking Tylenol for his abdominal pains.  He also thinks he has been taking too much Tylenol at bedtime, sometimes will take up to 5 extra strength Tylenol at bedtime to help him sleep regarding his upper back pain, which developed prior to his surgery.  His back surgeon is scheduled an outpatient MRI further evaluation of this.He states the back pain is separate from his abdominal pain.  Not on anticoagulation.  He does not take NSAIDs and rarely drinks alcohol.     The history is provided by the patient.    Past Medical History:  Diagnosis Date  . Arthritis   . Depression     There are no active problems to display for this patient.   Past Surgical History:  Procedure Laterality Date  . BACK SURGERY    . EYE SURGERY Bilateral    cataracts  . KNEE ARTHROSCOPY Left   . LUMBAR LAMINECTOMY/DECOMPRESSION MICRODISCECTOMY N/A 06/27/2019   Procedure: L3-4 HEMILAMINECTOMY DISCECTOMY;  Surgeon: Deetta Perla, MD;  Location: [facility] ORS;  Service: Neurosurgery;  Laterality: N/A;        Home Medications     Prior to Admission medications   Medication Sig Start Date End Date Taking? Authorizing Provider  acetaminophen (TYLENOL) 500 MG tablet Take 500 mg by mouth every 8 (eight) hours as needed (pain).    Yes [provider]  baclofen (LIORESAL) 10 MG tablet Take 5 mg by mouth 2 (two) times daily.   Yes [provider]  gabapentin (NEURONTIN) 300 MG capsule Take 300 mg by mouth 3 (three) times daily.   Yes [provider]  methocarbamol (ROBAXIN) 500 MG tablet Take 1 tablet (500 mg total) by mouth 3 (three) times daily. 06/27/19  Yes Marin Olp, PA-C  sertraline (ZOLOFT) 100 MG tablet Take 150 mg by mouth daily.   Yes [provider]    Family History No family history on file.  Social History Social History   Tobacco Use  . Smoking status: Former Smoker    Quit date: 06/22/2004    Years since quitting: 15.1  . Smokeless tobacco: Never Used  Substance Use Topics  . Alcohol use: Yes    Comment: every few days  . Drug use: Never     Allergies   Codeine   Review of Systems Review of Systems  Constitutional: Negative for fever.  Respiratory: Negative for shortness of breath.   Cardiovascular: Negative for chest pain.  Gastrointestinal: Positive for abdominal pain, blood in stool and diarrhea. Negative for rectal pain and vomiting.  Genitourinary: Negative for  dysuria and frequency.  Musculoskeletal: Positive for back pain.  Neurological: Negative for light-headedness.  Hematological: Does not bruise/bleed easily.  All other systems reviewed and are negative.    Physical Exam Updated Vital Signs BP (!) 147/73   Pulse 71   Temp 98.9 F (37.2 C) (Oral)   Resp 12   SpO2 100%   Physical Exam Vitals signs and nursing note reviewed.  Constitutional:      General: He is not in acute distress.    Appearance: He is well-developed. He is not ill-appearing.  HENT:     Head: Normocephalic and atraumatic.  Eyes:     Conjunctiva/sclera:  Conjunctivae normal.  Cardiovascular:     Rate and Rhythm: Normal rate and regular rhythm.  Pulmonary:     Effort: Pulmonary effort is normal. No respiratory distress.     Breath sounds: Normal breath sounds.  Abdominal:     General: Abdomen is flat. Bowel sounds are normal.     Palpations: Abdomen is soft.     Tenderness: There is no abdominal tenderness. There is no guarding or rebound.  Genitourinary:    Comments: Exam performed with male RN chaperone present.  No obvious hemorrhoids present on rectal exam.  No tenderness.  There is some dark brown stool present.  No gross blood. Skin:    General: Skin is warm.  Neurological:     Mental Status: He is alert.     Comments: spontaneously moving all extremities without difficulty.  Psychiatric:        Behavior: Behavior normal.      ED Treatments / Results  Labs (all labs ordered are listed, but only abnormal results are displayed) Labs Reviewed  COMPREHENSIVE METABOLIC PANEL - Abnormal; Notable for the following components:      Result Value   Potassium 2.9 (*)    CO2 18 (*)    Glucose, Bld 134 (*)    BUN 25 (*)    All other components within normal limits  CBC - Abnormal; Notable for the following components:   RBC 3.49 (*)    Hemoglobin 11.1 (*)    HCT 33.4 (*)    RDW 15.6 (*)    All other components within normal limits  POC OCCULT BLOOD, ED - Abnormal; Notable for the following components:   Fecal Occult Bld POSITIVE (*)    All other components within normal limits  SARS CORONAVIRUS 2 (TAT 6-24 HRS)  LIPASE, BLOOD  TYPE AND SCREEN  ABO/RH    EKG None  Radiology Ct Abdomen Pelvis W Contrast  Result Date: 08/10/2019 CLINICAL DATA:  73 year old male with upper abdominal pain. EXAM: CT ABDOMEN AND PELVIS WITH CONTRAST TECHNIQUE: Multidetector CT imaging of the abdomen and pelvis was performed using the standard protocol following bolus administration of intravenous contrast. CONTRAST:  159mL OMNIPAQUE IOHEXOL  300 MG/ML  SOLN COMPARISON:  None. FINDINGS: Lower chest: The visualized lung bases are clear. No intra-abdominal free air or free fluid. Hepatobiliary: Multiple scattered small hepatic hypodense lesions are too small to characterize but demonstrate fluid attenuation, likely cysts. No intrahepatic biliary ductal dilatation. No calcified gallstone. Pancreas: Mild inflammatory changes of the head of the pancreas, likely related to inflammation of the duodenal diverticulum. Acute pancreatitis as the cause of the inflammation is less likely. Correlation with pancreatic enzymes recommended. Spleen: Normal in size without focal abnormality. Adrenals/Urinary Tract: The adrenal glands are unremarkable. There is no hydronephrosis on either side. There is symmetric enhancement and excretion of contrast by both  kidneys. Several small bilateral renal hypodense lesions measure up to 15 mm in the upper pole of the right kidney. These lesions are suboptimally characterized but demonstrate fluid attenuation most consistent with cysts. Ultrasound may provide better evaluation on a nonemergent basis. The visualized ureters and urinary bladder appear unremarkable. Stomach/Bowel: There is a small hiatal hernia. There is a 2 cm duodenal diverticulum at the head of the pancreas. There is inflammatory changes of the duodenal diverticulum most consistent with diverticulitis. No evidence of perforation. No drainable fluid collection or abscess. There is no bowel obstruction. Loose stool noted in the proximal colon compatible with diarrheal state. The appendix is not visualized with certainty. No inflammatory changes identified in the right lower quadrant. Vascular/Lymphatic: Mild atherosclerotic calcification the aorta. The IVC is unremarkable. No portal venous gas. There is no adenopathy. Reproductive: The prostate and seminal vesicles are grossly unremarkable. Other: Small fat containing umbilical hernia. Musculoskeletal: Degenerative  changes of the spine. No acute osseous pathology. IMPRESSION: 1. Findings most consistent with diverticulitis of the second portion the duodenum with mild reactive inflammation of the head of the pancreas. Correlation with clinical exam and pancreatic enzymes recommended. No drainable fluid collection or evidence of perforation. 2. Diarrheal state. No bowel obstruction. Aortic Atherosclerosis (ICD10-I70.0). Electronically Signed   By: Anner Crete M.D.   On: 08/10/2019 22:32    Procedures Procedures (including critical care time)  Medications Ordered in ED Medications  piperacillin-tazobactam (ZOSYN) IVPB 3.375 g (has no administration in time range)  potassium chloride 10 mEq in 100 mL IVPB (has no administration in time range)  sodium chloride 0.9 % bolus 1,000 mL (1,000 mLs Intravenous New Bag/Given 08/10/19 2145)  fentaNYL (SUBLIMAZE) injection 50 mcg (50 mcg Intravenous Given 08/10/19 2145)  iohexol (OMNIPAQUE) 300 MG/ML solution 100 mL (100 mLs Intravenous Contrast Given 08/10/19 2211)     Initial Impression / Assessment and Plan / ED Course  I have reviewed the triage vital signs and the nursing notes.  Pertinent labs & imaging results that were available during my care of the patient were reviewed by me and considered in my medical decision making (see chart for details).  Clinical Course as of Aug 09 2301  Wed Aug 10, 2019  2255 Ct with diverticulitis, and what appears to be reactive inflammatory changes at the head of the pancreas. Pending lipase. Pt discussed with and evaluated by Dr. Rex Kras. Will administer and and admit for complicated diverticulitis with GI bleed.   [JR]    Clinical Course User Index [JR] Robinson, [place] N, PA-C       Patient presenting with 3 weeks of upper abdominal pain with black tarry loose stools.  No associated nausea vomiting or fever.  Patient is not on anticoagulation, does not take NSAIDs and rarely drinks alcohol.  He has been taking  Tylenol and admits to taking her doses then directed at bedtime to treat his pain though days if he does not take Tylenol regularly throughout the day.  Recent back surgery by Dr. Lacinda Axon with [facility] and has been having good recovery with minimal pain.  On exam, patient abdomen is nontender, soft, no guarding or rebound.  He is afebrile with stable vital signs.  Labs reveal hemoglobin of 11.1 which is down from 14.4 onemonth ago.  Normal LFTs.  Potassium is also down to 2.9, replaced with IV K. BUN is elevated at 25, consistent with acute bleed.  Proceeded with CT scan which reveals findings consistent with acute diverticulitis with what appears  to be reactive inflammatory changes of the head of the pancreas.  Lipase level pending.  Patient discussed with evaluated by Dr. Rex Kras.  Will admit for IV antibiotics for complicated diverticulitis given GI bleed with downtrending hemoglobin.   The patient appears reasonably stabilized for admission considering the current resources, flow, and capabilities available in the ED at this time, and I doubt any other [facility] requiring further screening and/or treatment in the ED prior to admission.  Final Clinical Impressions(s) / ED Diagnoses   Final diagnoses:  Diverticulitis  Gastrointestinal hemorrhage with melena    ED Discharge Orders    None       Robinson, [place] N, PA-C 08/10/19 2328    Little, Wenda Overland, MD 08/10/19 [phone]

## 2019-08-10 NOTE — ED Triage Notes (Signed)
C/o upper abd pain, diarrhea, and black tarry stools since Saturday.

## 2019-08-10 NOTE — H&P (Signed)
History and Physical    Jose Bender [id number] DOB: 03/09/1946 DOA: 08/10/2019  PCP: Vernie Shanks, MD   Patient coming from: Home   Chief Complaint: Upper abdominal pain, black tarry stool   HPI: Jose Bender is a 73 y.o. male with medical history significant for chronic low back pain status post lumbar surgery in August, now presenting to the emergency department for evaluation of upper abdominal pain and dark tarry stools.  Patient reports intermittent sharp upper abdominal pain for the past couple weeks, has had loose stool, and reports that it is dark and tarry.  Abdominal pain is localized to the upper abdomen, intermittent, sharp in character, severe at times, and with no alleviating or exacerbating factors identified.  He denies fevers, chills, cough, or shortness of breath.  He has never experienced this previously.  He denied any NSAID use or excessive alcohol use.  He is very pleased with the results of his back surgery.   ED Course: Upon arrival to the ED, patient is found to be afebrile, saturating well on room air, tachycardic to 110, and with stable blood pressure.  Chemistry panel is notable for potassium of 2.9 and bicarbonate of 18.  CBC features a normocytic anemia with hemoglobin of 11.1, down from 14.4 last month.  Fecal occult blood testing is positive.  Type and screen was performed and the patient was treated with 1 L of normal saline, fentanyl, IV potassium, and Zosyn in the ED.  Review of Systems:  All other systems reviewed and apart from HPI, are negative.  Past Medical History:  Diagnosis Date   Arthritis    Depression     Past Surgical History:  Procedure Laterality Date   BACK SURGERY     EYE SURGERY Bilateral    cataracts   KNEE ARTHROSCOPY Left    LUMBAR LAMINECTOMY/DECOMPRESSION MICRODISCECTOMY N/A 06/27/2019   Procedure: L3-4 HEMILAMINECTOMY DISCECTOMY;  Surgeon: Deetta Perla, MD;  Location: [facility] ORS;  Service: Neurosurgery;   Laterality: N/A;     reports that he quit smoking about 15 years ago. He has never used smokeless tobacco. He reports current alcohol use. He reports that he does not use drugs.  Allergies  Allergen Reactions   Codeine Nausea Only    History reviewed. No pertinent family history.   Prior to Admission medications   Medication Sig Start Date End Date Taking? Authorizing Provider  acetaminophen (TYLENOL) 500 MG tablet Take 500 mg by mouth every 8 (eight) hours as needed (pain).    Yes [provider]  baclofen (LIORESAL) 10 MG tablet Take 5 mg by mouth 2 (two) times daily.   Yes [provider]  gabapentin (NEURONTIN) 300 MG capsule Take 300 mg by mouth 3 (three) times daily.   Yes [provider]  methocarbamol (ROBAXIN) 500 MG tablet Take 1 tablet (500 mg total) by mouth 3 (three) times daily. 06/27/19  Yes Marin Olp, PA-C  sertraline (ZOLOFT) 100 MG tablet Take 150 mg by mouth daily.   Yes [provider]    Physical Exam: Vitals:   08/10/19 1854 08/10/19 2012 08/10/19 2130 08/10/19 2200  BP: 127/75 137/76 (!) 147/87 (!) 147/73  Pulse: 92 (!) 109 81 71  Resp: 16 18 14 12   Temp:  98.9 F (37.2 C)    TempSrc:  Oral    SpO2: 100% 100% 100% 100%    Constitutional: NAD, calm  Eyes: PERTLA, lids and conjunctivae normal ENMT: Mucous membranes are moist. Posterior pharynx clear of any  exudate or lesions.   Neck: normal, supple, no masses, no thyromegaly Respiratory: no wheezing, no crackles. Normal respiratory effort. No accessory muscle use.  Cardiovascular: S1 & S2 heard, regular rate and rhythm. No extremity edema.   Abdomen: No distension, soft, tender in mid-upper abdomen, no rebound pain or guarding. Bowel sounds active.  Musculoskeletal: no clubbing / cyanosis. No joint deformity upper and lower extremities.   Skin: no significant rashes, lesions, ulcers. Warm, dry, well-perfused. Neurologic: CN 2-12 grossly intact. Sensation intact.  Moving all extremities.  Psychiatric: Alert and oriented x 3. Calm, cooperative.    Labs on Admission: I have personally reviewed following labs and imaging studies  CBC: Recent Labs  Lab 08/10/19 1653  WBC 9.6  HGB 11.1*  HCT 33.4*  MCV 95.7  PLT [id number]   Basic Metabolic Panel: Recent Labs  Lab 08/10/19 1653  NA 135  K 2.9*  CL 102  CO2 18*  GLUCOSE 134*  BUN 25*  CREATININE 1.17  CALCIUM 9.0   GFR: CrCl cannot be calculated (Unknown ideal weight.). Liver Function Tests: Recent Labs  Lab 08/10/19 1653  AST 19  ALT 15  ALKPHOS 51  BILITOT 0.8  PROT 6.7  ALBUMIN 3.7   Recent Labs  Lab 08/10/19 1718  LIPASE 36   No results for input(s): AMMONIA in the last 168 hours. Coagulation Profile: No results for input(s): INR, PROTIME in the last 168 hours. Cardiac Enzymes: No results for input(s): CKTOTAL, CKMB, CKMBINDEX, TROPONINI in the last 168 hours. BNP (last 3 results) No results for input(s): PROBNP in the last 8760 hours. HbA1C: No results for input(s): HGBA1C in the last 72 hours. CBG: No results for input(s): GLUCAP in the last 168 hours. Lipid Profile: No results for input(s): CHOL, HDL, LDLCALC, TRIG, CHOLHDL, LDLDIRECT in the last 72 hours. Thyroid Function Tests: No results for input(s): TSH, T4TOTAL, FREET4, T3FREE, THYROIDAB in the last 72 hours. Anemia Panel: No results for input(s): VITAMINB12, FOLATE, FERRITIN, TIBC, IRON, RETICCTPCT in the last 72 hours. Urine analysis:    Component Value Date/Time   COLORURINE YELLOW (A) 06/23/2019 1522   APPEARANCEUR HAZY (A) 06/23/2019 1522   LABSPEC 1.029 06/23/2019 1522   PHURINE 5.0 06/23/2019 1522   GLUCOSEU NEGATIVE 06/23/2019 1522   HGBUR SMALL (A) 06/23/2019 1522   BILIRUBINUR SMALL (A) 06/23/2019 1522   KETONESUR NEGATIVE 06/23/2019 1522   PROTEINUR NEGATIVE 06/23/2019 1522   NITRITE NEGATIVE 06/23/2019 1522   LEUKOCYTESUR NEGATIVE 06/23/2019 1522   Sepsis  Labs: @LABRCNTIP (procalcitonin:4,lacticidven:4) )No results found for this or any previous visit (from the past 240 hour(s)).   Radiological Exams on Admission: Ct Abdomen Pelvis W Contrast  Result Date: 08/10/2019 CLINICAL DATA:  73 year old male with upper abdominal pain. EXAM: CT ABDOMEN AND PELVIS WITH CONTRAST TECHNIQUE: Multidetector CT imaging of the abdomen and pelvis was performed using the standard protocol following bolus administration of intravenous contrast. CONTRAST:  160mL OMNIPAQUE IOHEXOL 300 MG/ML  SOLN COMPARISON:  None. FINDINGS: Lower chest: The visualized lung bases are clear. No intra-abdominal free air or free fluid. Hepatobiliary: Multiple scattered small hepatic hypodense lesions are too small to characterize but demonstrate fluid attenuation, likely cysts. No intrahepatic biliary ductal dilatation. No calcified gallstone. Pancreas: Mild inflammatory changes of the head of the pancreas, likely related to inflammation of the duodenal diverticulum. Acute pancreatitis as the cause of the inflammation is less likely. Correlation with pancreatic enzymes recommended. Spleen: Normal in size without focal abnormality. Adrenals/Urinary Tract: The adrenal glands are unremarkable. There  is no hydronephrosis on either side. There is symmetric enhancement and excretion of contrast by both kidneys. Several small bilateral renal hypodense lesions measure up to 15 mm in the upper pole of the right kidney. These lesions are suboptimally characterized but demonstrate fluid attenuation most consistent with cysts. Ultrasound may provide better evaluation on a nonemergent basis. The visualized ureters and urinary bladder appear unremarkable. Stomach/Bowel: There is a small hiatal hernia. There is a 2 cm duodenal diverticulum at the head of the pancreas. There is inflammatory changes of the duodenal diverticulum most consistent with diverticulitis. No evidence of perforation. No drainable fluid  collection or abscess. There is no bowel obstruction. Loose stool noted in the proximal colon compatible with diarrheal state. The appendix is not visualized with certainty. No inflammatory changes identified in the right lower quadrant. Vascular/Lymphatic: Mild atherosclerotic calcification the aorta. The IVC is unremarkable. No portal venous gas. There is no adenopathy. Reproductive: The prostate and seminal vesicles are grossly unremarkable. Other: Small fat containing umbilical hernia. Musculoskeletal: Degenerative changes of the spine. No acute osseous pathology. IMPRESSION: 1. Findings most consistent with diverticulitis of the second portion the duodenum with mild reactive inflammation of the head of the pancreas. Correlation with clinical exam and pancreatic enzymes recommended. No drainable fluid collection or evidence of perforation. 2. Diarrheal state. No bowel obstruction. Aortic Atherosclerosis (ICD10-I70.0). Electronically Signed   By: Anner Crete M.D.   On: 08/10/2019 22:32    EKG: Ordered, not yet performed.   Assessment/Plan   1. Acute diverticulitis of duodenum with bleeding; anemia  - Presents with upper abdominal pain and melena, found to have Hgb of 11.1 (14.4 in August), melena on DRE, and CT findings concerning for diverticulitis involving second portion of duodenum without perforation or drainable fluid collection - Type and screen was performed in ED and Zosyn started  - Continue bowel-rest, antibiotics, pain-control, start IV Protonix q12h, trend H&H    2. Hypokalemia  - Serum potassium is 2.9 in ED, treated with 20 mEq IV potassium in ED  - Check EKG, add KCl to IVF, repeat chem panel in am    3. Chronic low back pain  - Minimal back pain and resolution of leg pain following surgery in August, now only using his pain medications for the upper abdominal pain     PPE: Mask, face shield  DVT prophylaxis: SCD's  Code Status: Full  Family Communication: Discussed  with patient  Consults called: none  Admission status: observation     Vianne Bulls, MD [facility] Pager [phone]  If 7PM-7AM, please contact night-coverage [email or web address] Password [facility]  08/10/2019, 11:34 PM

## 2019-08-11 DIAGNOSIS — K449 Diaphragmatic hernia without obstruction or gangrene: Secondary | ICD-10-CM | POA: Diagnosis present

## 2019-08-11 DIAGNOSIS — Z87891 Personal history of nicotine dependence: Secondary | ICD-10-CM | POA: Diagnosis not present

## 2019-08-11 DIAGNOSIS — K922 Gastrointestinal hemorrhage, unspecified: Secondary | ICD-10-CM | POA: Diagnosis not present

## 2019-08-11 DIAGNOSIS — F329 Major depressive disorder, single episode, unspecified: Secondary | ICD-10-CM | POA: Diagnosis present

## 2019-08-11 DIAGNOSIS — K2980 Duodenitis without bleeding: Secondary | ICD-10-CM | POA: Diagnosis present

## 2019-08-11 DIAGNOSIS — D500 Iron deficiency anemia secondary to blood loss (chronic): Secondary | ICD-10-CM | POA: Diagnosis not present

## 2019-08-11 DIAGNOSIS — Z20828 Contact with and (suspected) exposure to other viral communicable diseases: Secondary | ICD-10-CM | POA: Diagnosis present

## 2019-08-11 DIAGNOSIS — K269 Duodenal ulcer, unspecified as acute or chronic, without hemorrhage or perforation: Secondary | ICD-10-CM | POA: Diagnosis present

## 2019-08-11 DIAGNOSIS — Z79899 Other long term (current) drug therapy: Secondary | ICD-10-CM | POA: Diagnosis not present

## 2019-08-11 DIAGNOSIS — K5792 Diverticulitis of intestine, part unspecified, without perforation or abscess without bleeding: Secondary | ICD-10-CM | POA: Diagnosis not present

## 2019-08-11 DIAGNOSIS — E876 Hypokalemia: Secondary | ICD-10-CM | POA: Diagnosis present

## 2019-08-11 DIAGNOSIS — Z885 Allergy status to narcotic agent status: Secondary | ICD-10-CM | POA: Diagnosis not present

## 2019-08-11 DIAGNOSIS — M1990 Unspecified osteoarthritis, unspecified site: Secondary | ICD-10-CM | POA: Diagnosis present

## 2019-08-11 DIAGNOSIS — K921 Melena: Secondary | ICD-10-CM | POA: Diagnosis not present

## 2019-08-11 DIAGNOSIS — K5712 Diverticulitis of small intestine without perforation or abscess without bleeding: Secondary | ICD-10-CM | POA: Diagnosis present

## 2019-08-11 DIAGNOSIS — D62 Acute posthemorrhagic anemia: Secondary | ICD-10-CM | POA: Diagnosis present

## 2019-08-11 DIAGNOSIS — K2211 Ulcer of esophagus with bleeding: Secondary | ICD-10-CM | POA: Diagnosis present

## 2019-08-11 DIAGNOSIS — R935 Abnormal findings on diagnostic imaging of other abdominal regions, including retroperitoneum: Secondary | ICD-10-CM | POA: Diagnosis not present

## 2019-08-11 DIAGNOSIS — Z7289 Other problems related to lifestyle: Secondary | ICD-10-CM | POA: Diagnosis not present

## 2019-08-11 DIAGNOSIS — K2210 Ulcer of esophagus without bleeding: Secondary | ICD-10-CM | POA: Diagnosis not present

## 2019-08-11 DIAGNOSIS — G8929 Other chronic pain: Secondary | ICD-10-CM | POA: Diagnosis present

## 2019-08-11 DIAGNOSIS — Z9842 Cataract extraction status, left eye: Secondary | ICD-10-CM | POA: Diagnosis not present

## 2019-08-11 DIAGNOSIS — M545 Low back pain: Secondary | ICD-10-CM | POA: Diagnosis present

## 2019-08-11 DIAGNOSIS — K2970 Gastritis, unspecified, without bleeding: Secondary | ICD-10-CM | POA: Diagnosis present

## 2019-08-11 DIAGNOSIS — Z9841 Cataract extraction status, right eye: Secondary | ICD-10-CM | POA: Diagnosis not present

## 2019-08-11 LAB — COMPREHENSIVE METABOLIC PANEL
ALT: 13 U/L (ref 0–44)
AST: 15 U/L (ref 15–41)
Albumin: 3.4 g/dL — ABNORMAL LOW (ref 3.5–5.0)
Alkaline Phosphatase: 45 U/L (ref 38–126)
Anion gap: 13 (ref 5–15)
BUN: 19 mg/dL (ref 8–23)
CO2: 20 mmol/L — ABNORMAL LOW (ref 22–32)
Calcium: 8.5 mg/dL — ABNORMAL LOW (ref 8.9–10.3)
Chloride: 104 mmol/L (ref 98–111)
Creatinine, Ser: 0.86 mg/dL (ref 0.61–1.24)
GFR calc Af Amer: >60 mL/min (ref >60)
GFR calc non Af Amer: >60 mL/min (ref >60)
Glucose, Bld: 108 mg/dL — ABNORMAL HIGH (ref 70–99)
Potassium: 3.2 mmol/L — ABNORMAL LOW (ref 3.5–5.1)
Sodium: 137 mmol/L (ref 135–145)
Total Bilirubin: 0.7 mg/dL (ref 0.3–1.2)
Total Protein: 5.7 g/dL — ABNORMAL LOW (ref 6.5–8.1)

## 2019-08-11 LAB — HEMOGLOBIN
Hemoglobin: 9.6 g/dL — ABNORMAL LOW (ref 13.0–17.0)
Hemoglobin: 10.3 g/dL — ABNORMAL LOW (ref 13.0–17.0)

## 2019-08-11 LAB — MAGNESIUM: Magnesium: 2.1 mg/dL (ref 1.7–2.4)

## 2019-08-11 LAB — HEMATOCRIT
HCT: 28.9 % — ABNORMAL LOW (ref 39.0–52.0)
HCT: 31.6 % — ABNORMAL LOW (ref 39.0–52.0)

## 2019-08-11 LAB — SARS CORONAVIRUS 2 (TAT 6-24 HRS): SARS Coronavirus 2: NEGATIVE

## 2019-08-11 LAB — GLUCOSE, CAPILLARY: Glucose-Capillary: 90 mg/dL (ref 70–99)

## 2019-08-11 MED ORDER — SODIUM CHLORIDE 0.9% FLUSH
3.0000 mL | Freq: Two times a day (BID) | INTRAVENOUS | Status: DC
  Administered 2019-08-11 – 2019-08-13 (×4): 3 mL via INTRAVENOUS

## 2019-08-11 MED ORDER — FOLIC ACID 1 MG PO TABS
1.0000 mg | ORAL_TABLET | Freq: Every day | ORAL | Status: DC
  Administered 2019-08-11 – 2019-08-13 (×3): 1 mg via ORAL
  Filled 2019-08-11 (×3): qty 1

## 2019-08-11 MED ORDER — PIPERACILLIN-TAZOBACTAM 3.375 G IVPB
3.3750 g | Freq: Three times a day (TID) | INTRAVENOUS | Status: DC
  Administered 2019-08-11 – 2019-08-13 (×7): 3.375 g via INTRAVENOUS
  Filled 2019-08-11 (×9): qty 50

## 2019-08-11 MED ORDER — LORAZEPAM 2 MG/ML IJ SOLN: 1.0000 mg | INTRAMUSCULAR | Status: DC | PRN

## 2019-08-11 MED ORDER — THIAMINE HCL 100 MG/ML IJ SOLN: 100.0000 mg | Freq: Every day | INTRAMUSCULAR | Status: DC

## 2019-08-11 MED ORDER — POTASSIUM CHLORIDE IN NACL 40-0.9 MEQ/L-% IV SOLN
INTRAVENOUS | Status: AC
  Administered 2019-08-11: 50 mL/h via INTRAVENOUS
  Filled 2019-08-11: qty 1000

## 2019-08-11 MED ORDER — VITAMIN B-1 100 MG PO TABS
100.0000 mg | ORAL_TABLET | Freq: Every day | ORAL | Status: DC
  Administered 2019-08-11 – 2019-08-13 (×3): 100 mg via ORAL
  Filled 2019-08-11 (×3): qty 1

## 2019-08-11 MED ORDER — ADULT MULTIVITAMIN W/MINERALS CH
1.0000 | ORAL_TABLET | Freq: Every day | ORAL | Status: DC
  Administered 2019-08-11 – 2019-08-13 (×3): 1 via ORAL
  Filled 2019-08-11 (×3): qty 1

## 2019-08-11 MED ORDER — LORAZEPAM 1 MG PO TABS: 1.0000 mg | ORAL_TABLET | ORAL | Status: DC | PRN

## 2019-08-11 MED ORDER — ONDANSETRON HCL 4 MG PO TABS: 4.0000 mg | ORAL_TABLET | Freq: Four times a day (QID) | ORAL | Status: DC | PRN

## 2019-08-11 MED ORDER — ONDANSETRON HCL 4 MG/2ML IJ SOLN: 4.0000 mg | Freq: Four times a day (QID) | INTRAMUSCULAR | Status: DC | PRN

## 2019-08-11 NOTE — Progress Notes (Signed)
New Admission Note:  Arrival Method: via stretcher from ED Mental Orientation: Alert & Oriented x4 Telemetry: CCMD verified Assessment: Completed Skin: Refer to flowsheet IV: Left AC Pain: 6/10 Safety Measures: Safety Fall Prevention Plan discussed with patient. Admission: Completed [facility] [place] Orientation: Patient has been orientated to the room, unit and the staff. Orders have been reviewed and are being implemented. Will continue to monitor the patient. Call light has been placed within reach.   Vassie Moselle, RN  Phone Number: [phone]

## 2019-08-11 NOTE — ED Notes (Signed)
Report called to rn on 5 m 

## 2019-08-11 NOTE — Consult Note (Signed)
Referring Provider: [facility] Primary Care Physician:  Vernie Shanks, MD Primary Gastroenterologist:  Sadie [facility] Primary   Reason for Consultation: GI bleed  HPI: Jose Bender is a 73 y.o. male past medical history of chronic low back pain status post lumbar surgery in August presented to the hospital yesterday with abdominal pain and dark stools.  He was found to have mild drop in hemoglobin at 11.1.  Occult blood positive.  CT abdomen pelvis with contrast showed 2 cm duodenal diverticulum at the head of the pancreas with inflammatory changes.  GI is consulted for further evaluation.  Patient seen and examined at bedside.  He is complaining of intermittent epigastric discomfort for last 3 to 6 months.  He started noticing dark/black color stool around 3 to 4 weeks ago.  Usually 1-2 bowel movements per day.  Denies any bright red blood per rectum.  He has been losing weight since his neck surgery.  Denies any diarrhea or constipation.  Last colonoscopy around 10 years ago was normal according to patient.  No family history of colon cancer or pancreatic cancer  Past Medical History:  Diagnosis Date  . Arthritis   . Depression     Past Surgical History:  Procedure Laterality Date  . BACK SURGERY    . EYE SURGERY Bilateral    cataracts  . KNEE ARTHROSCOPY Left   . LUMBAR LAMINECTOMY/DECOMPRESSION MICRODISCECTOMY N/A 06/27/2019   Procedure: L3-4 HEMILAMINECTOMY DISCECTOMY;  Surgeon: Deetta Perla, MD;  Location: [facility] ORS;  Service: Neurosurgery;  Laterality: N/A;    Prior to Admission medications   Medication Sig Start Date End Date Taking? Authorizing Provider  acetaminophen (TYLENOL) 500 MG tablet Take 500 mg by mouth every 8 (eight) hours as needed (pain).    Yes [provider]  baclofen (LIORESAL) 10 MG tablet Take 5 mg by mouth 2 (two) times daily.   Yes [provider]  gabapentin (NEURONTIN) 300 MG capsule Take 300 mg by mouth 3 (three) times daily.   Yes [provider]  methocarbamol (ROBAXIN) 500 MG tablet Take 1 tablet (500 mg total) by mouth 3 (three) times daily. 06/27/19  Yes Marin Olp, PA-C  sertraline (ZOLOFT) 100 MG tablet Take 150 mg by mouth daily.   Yes [provider]    Scheduled Meds: . folic acid  1 mg Oral Daily  . multivitamin with minerals  1 tablet Oral Daily  . pantoprazole (PROTONIX) IV  40 mg Intravenous Q12H  . sodium chloride flush  3 mL Intravenous Q12H  . thiamine  100 mg Oral Daily   Or  . thiamine  100 mg Intravenous Daily   Continuous Infusions: . 0.9 % NaCl with KCl 40 mEq / L    . piperacillin-tazobactam (ZOSYN)  IV 3.375 g (08/11/19 0629)   PRN Meds:.acetaminophen **OR** acetaminophen, fentaNYL (SUBLIMAZE) injection, LORazepam **OR** LORazepam, ondansetron **OR** ondansetron (ZOFRAN) IV  Allergies as of 08/10/2019 - Review Complete 08/10/2019  Allergen Reaction Noted  . Codeine Nausea Only 05/20/2019    History reviewed. No pertinent family history.  Social History   Socioeconomic History  . Marital status: Single    Spouse name: Not on file  . Number of children: Not on file  . Years of education: Not on file  . Highest education level: Not on file  Occupational History  . Not on file  Social Needs  . Financial resource strain: Not on file  . Food insecurity    Worry: Not on file    Inability:  Not on file  . Transportation needs    Medical: Not on file    Non-medical: Not on file  Tobacco Use  . Smoking status: Former Smoker    Quit date: 06/22/2004    Years since quitting: 15.1  . Smokeless tobacco: Never Used  Substance and Sexual Activity  . Alcohol use: Yes    Comment: every few days  . Drug use: Never  . Sexual activity: Not on file  Lifestyle  . Physical activity    Days per week: Not on file    Minutes per session: Not on file  . Stress: Not on file  Relationships  . Social Herbalist on phone: Not on file    Gets together: Not on file     Attends religious service: Not on file    Active member of club or organization: Not on file    Attends meetings of clubs or organizations: Not on file    Relationship status: Not on file  . Intimate partner violence    Fear of current or ex partner: Not on file    Emotionally abused: Not on file    Physically abused: Not on file    Forced sexual activity: Not on file  Other Topics Concern  . Not on file  Social History Narrative  . Not on file    Review of Systems:Review of Systems  Constitutional: Positive for weight loss. Negative for chills and fever.  HENT: Negative for hearing loss and tinnitus.   Eyes: Negative for blurred vision and double vision.  Respiratory: Negative for cough and hemoptysis.   Cardiovascular: Negative for chest pain and palpitations.  Gastrointestinal: Positive for abdominal pain, heartburn and melena. Negative for constipation, nausea and vomiting.  Genitourinary: Negative for dysuria and urgency.  Musculoskeletal: Positive for back pain and joint pain.  Skin: Negative for itching and rash.  Neurological: Negative for seizures and loss of consciousness.  Endo/Heme/Allergies: Does not bruise/bleed easily.  Psychiatric/Behavioral: Negative for hallucinations and suicidal ideas.    Physical Exam: Vital signs: Vitals:   08/11/19 0800 08/11/19 1311  BP: 123/81 131/74  Pulse: 74 69  Resp:  18  Temp: 99.1 F (37.3 C) 98.7 F (37.1 C)  SpO2: 100% 100%   Last BM Date: 08/11/19 Physical Exam  Constitutional: He is oriented to person, place, and time. He appears well-developed and well-nourished. No distress.  HENT:  Head: Normocephalic and atraumatic.  Eyes: EOM are normal. No scleral icterus.  Neck: Normal range of motion. Neck supple.  Cardiovascular: Normal rate, regular rhythm and normal heart sounds.  Pulmonary/Chest: Effort normal. No respiratory distress.  Abdominal: Soft. Bowel sounds are normal. He exhibits no distension. There is no  abdominal tenderness. There is no rebound and no guarding.  Musculoskeletal: Normal range of motion.        General: No edema.  Neurological: He is alert and oriented to person, place, and time.  Skin: Skin is warm. No erythema.  Psychiatric: He has a normal mood and affect. Thought content normal.  Vitals reviewed.  GI:  Lab Results: Recent Labs    08/10/19 1653 08/11/19 0408 08/11/19 1133  WBC 9.6  --   --   HGB 11.1* 9.6* 10.3*  HCT 33.4* 28.9* 31.6*  PLT 367  --   --    BMET Recent Labs    08/10/19 1653 08/11/19 0600  NA 135 137  K 2.9* 3.2*  CL 102 104  CO2 18* 20*  GLUCOSE  134* 108*  BUN 25* 19  CREATININE 1.17 0.86  CALCIUM 9.0 8.5*   LFT Recent Labs    08/11/19 0600  PROT 5.7*  ALBUMIN 3.4*  AST 15  ALT 13  ALKPHOS 45  BILITOT 0.7   PT/INR No results for input(s): LABPROT, INR in the last 72 hours.   Studies/Results: Ct Abdomen Pelvis W Contrast  Result Date: 08/10/2019 CLINICAL DATA:  73 year old male with upper abdominal pain. EXAM: CT ABDOMEN AND PELVIS WITH CONTRAST TECHNIQUE: Multidetector CT imaging of the abdomen and pelvis was performed using the standard protocol following bolus administration of intravenous contrast. CONTRAST:  165mL OMNIPAQUE IOHEXOL 300 MG/ML  SOLN COMPARISON:  None. FINDINGS: Lower chest: The visualized lung bases are clear. No intra-abdominal free air or free fluid. Hepatobiliary: Multiple scattered small hepatic hypodense lesions are too small to characterize but demonstrate fluid attenuation, likely cysts. No intrahepatic biliary ductal dilatation. No calcified gallstone. Pancreas: Mild inflammatory changes of the head of the pancreas, likely related to inflammation of the duodenal diverticulum. Acute pancreatitis as the cause of the inflammation is less likely. Correlation with pancreatic enzymes recommended. Spleen: Normal in size without focal abnormality. Adrenals/Urinary Tract: The adrenal glands are unremarkable. There  is no hydronephrosis on either side. There is symmetric enhancement and excretion of contrast by both kidneys. Several small bilateral renal hypodense lesions measure up to 15 mm in the upper pole of the right kidney. These lesions are suboptimally characterized but demonstrate fluid attenuation most consistent with cysts. Ultrasound may provide better evaluation on a nonemergent basis. The visualized ureters and urinary bladder appear unremarkable. Stomach/Bowel: There is a small hiatal hernia. There is a 2 cm duodenal diverticulum at the head of the pancreas. There is inflammatory changes of the duodenal diverticulum most consistent with diverticulitis. No evidence of perforation. No drainable fluid collection or abscess. There is no bowel obstruction. Loose stool noted in the proximal colon compatible with diarrheal state. The appendix is not visualized with certainty. No inflammatory changes identified in the right lower quadrant. Vascular/Lymphatic: Mild atherosclerotic calcification the aorta. The IVC is unremarkable. No portal venous gas. There is no adenopathy. Reproductive: The prostate and seminal vesicles are grossly unremarkable. Other: Small fat containing umbilical hernia. Musculoskeletal: Degenerative changes of the spine. No acute osseous pathology. IMPRESSION: 1. Findings most consistent with diverticulitis of the second portion the duodenum with mild reactive inflammation of the head of the pancreas. Correlation with clinical exam and pancreatic enzymes recommended. No drainable fluid collection or evidence of perforation. 2. Diarrheal state. No bowel obstruction. Aortic Atherosclerosis (ICD10-I70.0). Electronically Signed   By: Anner Crete M.D.   On: 08/10/2019 22:32    Impression/Plan: -Melena with CT scan showing large duodenal diverticulum with possible diverticulitis -Chronic blood loss anemia.  Complaining of melena for last 4  weeks.  Recommendations ------------------------- -Continue IV twice daily PPI for now -Plan for EGD tomorrow.  If EGD does not explain his current findings, recommend MRI for further evaluation. -Okay to have full liquid diet today.  N.p.o. past midnight  Risks (bleeding, infection, bowel perforation that could require surgery, sedation-related changes in cardiopulmonary systems), benefits (identification and possible treatment of source of symptoms, exclusion of certain causes of symptoms), and alternatives (watchful waiting, radiographic imaging studies, empiric medical treatment)  were explained to patient/family in detail and patient wishes to proceed.    LOS: 0 days   Otis Brace  MD, FACP 08/11/2019, 1:59 PM  Contact #  [phone]

## 2019-08-11 NOTE — H&P (View-Only) (Signed)
Referring Provider: [facility] Primary Care Physician:  Vernie Shanks, MD Primary Gastroenterologist:  Sadie Haber Primary   Reason for Consultation: GI bleed  HPI: Jose Bender is a 73 y.o. male past medical history of chronic low back pain status post lumbar surgery in August presented to the hospital yesterday with abdominal pain and dark stools.  He was found to have mild drop in hemoglobin at 11.1.  Occult blood positive.  CT abdomen pelvis with contrast showed 2 cm duodenal diverticulum at the head of the pancreas with inflammatory changes.  GI is consulted for further evaluation.  Patient seen and examined at bedside.  He is complaining of intermittent epigastric discomfort for last 3 to 6 months.  He started noticing dark/black color stool around 3 to 4 weeks ago.  Usually 1-2 bowel movements per day.  Denies any bright red blood per rectum.  He has been losing weight since his neck surgery.  Denies any diarrhea or constipation.  Last colonoscopy around 10 years ago was normal according to patient.  No family history of colon cancer or pancreatic cancer  Past Medical History:  Diagnosis Date  . Arthritis   . Depression     Past Surgical History:  Procedure Laterality Date  . BACK SURGERY    . EYE SURGERY Bilateral    cataracts  . KNEE ARTHROSCOPY Left   . LUMBAR LAMINECTOMY/DECOMPRESSION MICRODISCECTOMY N/A 06/27/2019   Procedure: L3-4 HEMILAMINECTOMY DISCECTOMY;  Surgeon: Deetta Perla, MD;  Location: [facility] ORS;  Service: Neurosurgery;  Laterality: N/A;    Prior to Admission medications   Medication Sig Start Date End Date Taking? Authorizing Provider  acetaminophen (TYLENOL) 500 MG tablet Take 500 mg by mouth every 8 (eight) hours as needed (pain).    Yes [provider]  baclofen (LIORESAL) 10 MG tablet Take 5 mg by mouth 2 (two) times daily.   Yes [provider]  gabapentin (NEURONTIN) 300 MG capsule Take 300 mg by mouth 3 (three) times daily.   Yes [provider]  methocarbamol (ROBAXIN) 500 MG tablet Take 1 tablet (500 mg total) by mouth 3 (three) times daily. 06/27/19  Yes Marin Olp, PA-C  sertraline (ZOLOFT) 100 MG tablet Take 150 mg by mouth daily.   Yes [provider]    Scheduled Meds: . folic acid  1 mg Oral Daily  . multivitamin with minerals  1 tablet Oral Daily  . pantoprazole (PROTONIX) IV  40 mg Intravenous Q12H  . sodium chloride flush  3 mL Intravenous Q12H  . thiamine  100 mg Oral Daily   Or  . thiamine  100 mg Intravenous Daily   Continuous Infusions: . 0.9 % NaCl with KCl 40 mEq / L    . piperacillin-tazobactam (ZOSYN)  IV 3.375 g (08/11/19 0629)   PRN Meds:.acetaminophen **OR** acetaminophen, fentaNYL (SUBLIMAZE) injection, LORazepam **OR** LORazepam, ondansetron **OR** ondansetron (ZOFRAN) IV  Allergies as of 08/10/2019 - Review Complete 08/10/2019  Allergen Reaction Noted  . Codeine Nausea Only 05/20/2019    History reviewed. No pertinent family history.  Social History   Socioeconomic History  . Marital status: Single    Spouse name: Not on file  . Number of children: Not on file  . Years of education: Not on file  . Highest education level: Not on file  Occupational History  . Not on file  Social Needs  . Financial resource strain: Not on file  . Food insecurity    Worry: Not on file    Inability:  Not on file  . Transportation needs    Medical: Not on file    Non-medical: Not on file  Tobacco Use  . Smoking status: Former Smoker    Quit date: 06/22/2004    Years since quitting: 15.1  . Smokeless tobacco: Never Used  Substance and Sexual Activity  . Alcohol use: Yes    Comment: every few days  . Drug use: Never  . Sexual activity: Not on file  Lifestyle  . Physical activity    Days per week: Not on file    Minutes per session: Not on file  . Stress: Not on file  Relationships  . Social Herbalist on phone: Not on file    Gets together: Not on file     Attends religious service: Not on file    Active member of club or organization: Not on file    Attends meetings of clubs or organizations: Not on file    Relationship status: Not on file  . Intimate partner violence    Fear of current or ex partner: Not on file    Emotionally abused: Not on file    Physically abused: Not on file    Forced sexual activity: Not on file  Other Topics Concern  . Not on file  Social History Narrative  . Not on file    Review of Systems:Review of Systems  Constitutional: Positive for weight loss. Negative for chills and fever.  HENT: Negative for hearing loss and tinnitus.   Eyes: Negative for blurred vision and double vision.  Respiratory: Negative for cough and hemoptysis.   Cardiovascular: Negative for chest pain and palpitations.  Gastrointestinal: Positive for abdominal pain, heartburn and melena. Negative for constipation, nausea and vomiting.  Genitourinary: Negative for dysuria and urgency.  Musculoskeletal: Positive for back pain and joint pain.  Skin: Negative for itching and rash.  Neurological: Negative for seizures and loss of consciousness.  Endo/Heme/Allergies: Does not bruise/bleed easily.  Psychiatric/Behavioral: Negative for hallucinations and suicidal ideas.    Physical Exam: Vital signs: Vitals:   08/11/19 0800 08/11/19 1311  BP: 123/81 131/74  Pulse: 74 69  Resp:  18  Temp: 99.1 F (37.3 C) 98.7 F (37.1 C)  SpO2: 100% 100%   Last BM Date: 08/11/19 Physical Exam  Constitutional: He is oriented to person, place, and time. He appears well-developed and well-nourished. No distress.  HENT:  Head: Normocephalic and atraumatic.  Eyes: EOM are normal. No scleral icterus.  Neck: Normal range of motion. Neck supple.  Cardiovascular: Normal rate, regular rhythm and normal heart sounds.  Pulmonary/Chest: Effort normal. No respiratory distress.  Abdominal: Soft. Bowel sounds are normal. He exhibits no distension. There is no  abdominal tenderness. There is no rebound and no guarding.  Musculoskeletal: Normal range of motion.        General: No edema.  Neurological: He is alert and oriented to person, place, and time.  Skin: Skin is warm. No erythema.  Psychiatric: He has a normal mood and affect. Thought content normal.  Vitals reviewed.  GI:  Lab Results: Recent Labs    08/10/19 1653 08/11/19 0408 08/11/19 1133  WBC 9.6  --   --   HGB 11.1* 9.6* 10.3*  HCT 33.4* 28.9* 31.6*  PLT 367  --   --    BMET Recent Labs    08/10/19 1653 08/11/19 0600  NA 135 137  K 2.9* 3.2*  CL 102 104  CO2 18* 20*  GLUCOSE  134* 108*  BUN 25* 19  CREATININE 1.17 0.86  CALCIUM 9.0 8.5*   LFT Recent Labs    08/11/19 0600  PROT 5.7*  ALBUMIN 3.4*  AST 15  ALT 13  ALKPHOS 45  BILITOT 0.7   PT/INR No results for input(s): LABPROT, INR in the last 72 hours.   Studies/Results: Ct Abdomen Pelvis W Contrast  Result Date: 08/10/2019 CLINICAL DATA:  73 year old male with upper abdominal pain. EXAM: CT ABDOMEN AND PELVIS WITH CONTRAST TECHNIQUE: Multidetector CT imaging of the abdomen and pelvis was performed using the standard protocol following bolus administration of intravenous contrast. CONTRAST:  154mL OMNIPAQUE IOHEXOL 300 MG/ML  SOLN COMPARISON:  None. FINDINGS: Lower chest: The visualized lung bases are clear. No intra-abdominal free air or free fluid. Hepatobiliary: Multiple scattered small hepatic hypodense lesions are too small to characterize but demonstrate fluid attenuation, likely cysts. No intrahepatic biliary ductal dilatation. No calcified gallstone. Pancreas: Mild inflammatory changes of the head of the pancreas, likely related to inflammation of the duodenal diverticulum. Acute pancreatitis as the cause of the inflammation is less likely. Correlation with pancreatic enzymes recommended. Spleen: Normal in size without focal abnormality. Adrenals/Urinary Tract: The adrenal glands are unremarkable. There  is no hydronephrosis on either side. There is symmetric enhancement and excretion of contrast by both kidneys. Several small bilateral renal hypodense lesions measure up to 15 mm in the upper pole of the right kidney. These lesions are suboptimally characterized but demonstrate fluid attenuation most consistent with cysts. Ultrasound may provide better evaluation on a nonemergent basis. The visualized ureters and urinary bladder appear unremarkable. Stomach/Bowel: There is a small hiatal hernia. There is a 2 cm duodenal diverticulum at the head of the pancreas. There is inflammatory changes of the duodenal diverticulum most consistent with diverticulitis. No evidence of perforation. No drainable fluid collection or abscess. There is no bowel obstruction. Loose stool noted in the proximal colon compatible with diarrheal state. The appendix is not visualized with certainty. No inflammatory changes identified in the right lower quadrant. Vascular/Lymphatic: Mild atherosclerotic calcification the aorta. The IVC is unremarkable. No portal venous gas. There is no adenopathy. Reproductive: The prostate and seminal vesicles are grossly unremarkable. Other: Small fat containing umbilical hernia. Musculoskeletal: Degenerative changes of the spine. No acute osseous pathology. IMPRESSION: 1. Findings most consistent with diverticulitis of the second portion the duodenum with mild reactive inflammation of the head of the pancreas. Correlation with clinical exam and pancreatic enzymes recommended. No drainable fluid collection or evidence of perforation. 2. Diarrheal state. No bowel obstruction. Aortic Atherosclerosis (ICD10-I70.0). Electronically Signed   By: Anner Crete M.D.   On: 08/10/2019 22:32    Impression/Plan: -Melena with CT scan showing large duodenal diverticulum with possible diverticulitis -Chronic blood loss anemia.  Complaining of melena for last 4  weeks.  Recommendations ------------------------- -Continue IV twice daily PPI for now -Plan for EGD tomorrow.  If EGD does not explain his current findings, recommend MRI for further evaluation. -Okay to have full liquid diet today.  N.p.o. past midnight  Risks (bleeding, infection, bowel perforation that could require surgery, sedation-related changes in cardiopulmonary systems), benefits (identification and possible treatment of source of symptoms, exclusion of certain causes of symptoms), and alternatives (watchful waiting, radiographic imaging studies, empiric medical treatment)  were explained to patient/family in detail and patient wishes to proceed.    LOS: 0 days   Otis Brace  MD, FACP 08/11/2019, 1:59 PM  Contact #  [phone]

## 2019-08-11 NOTE — Progress Notes (Addendum)
Progress Note    Jose Bender  [id number] DOB: [birth year]  DOA: 08/10/2019 PCP: Vernie Shanks, MD    Brief Narrative:     Medical records reviewed and are as summarized below:  Jose Bender is an 73 y.o. male with medical history significant for chronic low back pain status post lumbar surgery in August, now presenting to the emergency department for evaluation of upper abdominal pain and dark tarry stools.  Patient reports intermittent sharp upper abdominal pain for the past couple weeks, has had loose stool, and reports that it is dark and tarry.    Assessment/Plan:   Principal Problem:   Upper GI bleeding Active Problems:   Diverticulitis of duodenum   Chronic low back pain   Hypokalemia   Normocytic anemia    Acute blood loss anemia Acute diverticulitis of duodenum with bleeding - Presents with upper abdominal pain and melena -his HGB was14.4 in August-- now 9.6 - melena on DRE -CT findings concerning for diverticulitis involving second portion of duodenum without perforation or drainable fluid collection - Continue bowel-rest, antibiotics, pain-control, start IV Protonix q12h -GI consult (Dr. Alessandra Bevels)  -denies NSAIDs  Alcohol use -CIWA -denies heaviness   Hypokalemia  - Serum potassium is 2.9 in ED -Mg ok -replete IVF  Chronic low back pain  - Minimal back pain and resolution of leg pain following surgery in August, now only using his pain medications for the upper abdominal pain     Family Communication/Anticipated D/C date and plan/Code Status   DVT prophylaxis: scd Code Status: Full Code.  Family Communication:  Disposition Plan: pending improvement in symptoms- currently NPO, getting IVF and IV pain medications (3 doses in a <12 hour period)-- have placed GI consult Patient was also tachycardic Will change to inpt as > 2 midnights  Medical Consultants:    GI- [place]  Subjective:   C/o pain in lower ribs/epigastric  region  Objective:    Vitals:   08/11/19 0445 08/11/19 0500 08/11/19 0546 08/11/19 0800  BP:  (!) 148/75 (!) 152/69 123/81  Pulse: 71  69 74  Resp: 17 12 18    Temp:   (!) 97.4 F (36.3 C) 99.1 F (37.3 C)  TempSrc:   Oral Oral  SpO2: 97%  99% 100%    Intake/Output Summary (Last 24 hours) at 08/11/2019 0856 Last data filed at 08/11/2019 0850 Gross per 24 hour  Intake 666.73 ml  Output 600 ml  Net 66.73 ml   There were no vitals filed for this visit.  Exam: In bed, mildly tremulous Pleasant +BS, soft, mild tenderness in epigastric  Data Reviewed:   I have personally reviewed following labs and imaging studies:  Labs: Labs show the following:   Basic Metabolic Panel: Recent Labs  Lab 08/10/19 1653 08/11/19 0600  NA 135 137  K 2.9* 3.2*  CL 102 104  CO2 18* 20*  GLUCOSE 134* 108*  BUN 25* 19  CREATININE 1.17 0.86  CALCIUM 9.0 8.5*  MG  --  2.1   GFR CrCl cannot be calculated (Unknown ideal weight.). Liver Function Tests: Recent Labs  Lab 08/10/19 1653 08/11/19 0600  AST 19 15  ALT 15 13  ALKPHOS 51 45  BILITOT 0.8 0.7  PROT 6.7 5.7*  ALBUMIN 3.7 3.4*   Recent Labs  Lab 08/10/19 1718  LIPASE 36   No results for input(s): AMMONIA in the last 168 hours. Coagulation profile No results for input(s): INR, PROTIME in the last 168 hours.  CBC: Recent Labs  Lab 08/10/19 1653 08/11/19 0408  WBC 9.6  --   HGB 11.1* 9.6*  HCT 33.4* 28.9*  MCV 95.7  --   PLT 367  --    Cardiac Enzymes: No results for input(s): CKTOTAL, CKMB, CKMBINDEX, TROPONINI in the last 168 hours. BNP (last 3 results) No results for input(s): PROBNP in the last 8760 hours. CBG: Recent Labs  Lab 08/11/19 0832  GLUCAP 90   D-Dimer: No results for input(s): DDIMER in the last 72 hours. Hgb A1c: No results for input(s): HGBA1C in the last 72 hours. Lipid Profile: No results for input(s): CHOL, HDL, LDLCALC, TRIG, CHOLHDL, LDLDIRECT in the last 72 hours. Thyroid  function studies: No results for input(s): TSH, T4TOTAL, T3FREE, THYROIDAB in the last 72 hours.  Invalid input(s): FREET3 Anemia work up: No results for input(s): VITAMINB12, FOLATE, FERRITIN, TIBC, IRON, RETICCTPCT in the last 72 hours. Sepsis Labs: Recent Labs  Lab 08/10/19 1653  WBC 9.6    Microbiology No results found for this or any previous visit (from the past 240 hour(s)).  Procedures and diagnostic studies:  Ct Abdomen Pelvis W Contrast  Result Date: 08/10/2019 CLINICAL DATA:  73 year old male with upper abdominal pain. EXAM: CT ABDOMEN AND PELVIS WITH CONTRAST TECHNIQUE: Multidetector CT imaging of the abdomen and pelvis was performed using the standard protocol following bolus administration of intravenous contrast. CONTRAST:  125mL OMNIPAQUE IOHEXOL 300 MG/ML  SOLN COMPARISON:  None. FINDINGS: Lower chest: The visualized lung bases are clear. No intra-abdominal free air or free fluid. Hepatobiliary: Multiple scattered small hepatic hypodense lesions are too small to characterize but demonstrate fluid attenuation, likely cysts. No intrahepatic biliary ductal dilatation. No calcified gallstone. Pancreas: Mild inflammatory changes of the head of the pancreas, likely related to inflammation of the duodenal diverticulum. Acute pancreatitis as the cause of the inflammation is less likely. Correlation with pancreatic enzymes recommended. Spleen: Normal in size without focal abnormality. Adrenals/Urinary Tract: The adrenal glands are unremarkable. There is no hydronephrosis on either side. There is symmetric enhancement and excretion of contrast by both kidneys. Several small bilateral renal hypodense lesions measure up to 15 mm in the upper pole of the right kidney. These lesions are suboptimally characterized but demonstrate fluid attenuation most consistent with cysts. Ultrasound may provide better evaluation on a nonemergent basis. The visualized ureters and urinary bladder appear  unremarkable. Stomach/Bowel: There is a small hiatal hernia. There is a 2 cm duodenal diverticulum at the head of the pancreas. There is inflammatory changes of the duodenal diverticulum most consistent with diverticulitis. No evidence of perforation. No drainable fluid collection or abscess. There is no bowel obstruction. Loose stool noted in the proximal colon compatible with diarrheal state. The appendix is not visualized with certainty. No inflammatory changes identified in the right lower quadrant. Vascular/Lymphatic: Mild atherosclerotic calcification the aorta. The IVC is unremarkable. No portal venous gas. There is no adenopathy. Reproductive: The prostate and seminal vesicles are grossly unremarkable. Other: Small fat containing umbilical hernia. Musculoskeletal: Degenerative changes of the spine. No acute osseous pathology. IMPRESSION: 1. Findings most consistent with diverticulitis of the second portion the duodenum with mild reactive inflammation of the head of the pancreas. Correlation with clinical exam and pancreatic enzymes recommended. No drainable fluid collection or evidence of perforation. 2. Diarrheal state. No bowel obstruction. Aortic Atherosclerosis (ICD10-I70.0). Electronically Signed   By: Anner Crete M.D.   On: 08/10/2019 22:32    Medications:    folic acid  1 mg Oral  Daily   multivitamin with minerals  1 tablet Oral Daily   pantoprazole (PROTONIX) IV  40 mg Intravenous Q12H   sodium chloride flush  3 mL Intravenous Q12H   thiamine  100 mg Oral Daily   Or   thiamine  100 mg Intravenous Daily   Continuous Infusions:  0.9 % NaCl with KCl 40 mEq / L 125 mL/hr (08/11/19 0230)   piperacillin-tazobactam (ZOSYN)  IV 3.375 g (08/11/19 0629)     LOS: 0 days   Geradine Girt  [facility]   How to contact the [facility] Attending or Consulting provider [place] or covering provider during after hours [place], for this patient?  1. Check the care team in [facility] and  look for a) attending/consulting [facility] provider listed and b) the [facility] team listed 2. Log into [email or web address] and use [place]'s universal password to access. If you do not have the password, please contact the hospital operator. 3. Locate the [facility] provider you are looking for under [facility] and page to a number that you can be directly reached. 4. If you still have difficulty reaching the provider, please page the [facility] (Director on Call) for the Hospitalists listed on amion for assistance.  08/11/2019, 8:56 AM

## 2019-08-11 NOTE — Progress Notes (Signed)
Pharmacy Antibiotic Note  Jose Bender is a 73 y.o. male admitted on 08/10/2019 with intra-abdominal infection.  Pharmacy has been consulted for Zosyn dosing. WBC WNL. Renal function good.   Plan: Zosyn 3.375G IV q8h to be infused over 4 hours Trend WBC, temp, renal function  F/U infectious work-up  Temp (24hrs), Avg:99 F (37.2 C), Min:98.9 F (37.2 C), Max:99.1 F (37.3 C)  Recent Labs  Lab 08/10/19 1653  WBC 9.6  CREATININE 1.17    CrCl cannot be calculated (Unknown ideal weight.).    Allergies  Allergen Reactions  . Codeine Nausea Only    Narda Bonds, PharmD, BCPS Clinical Pharmacist Phone: [phone]

## 2019-08-11 NOTE — Plan of Care (Signed)
  Problem: Education: Goal: Knowledge of General Education information will improve Description Including pain rating scale, medication(s)/side effects and non-pharmacologic comfort measures Outcome: Progressing   

## 2019-08-12 ENCOUNTER — Encounter (HOSPITAL_COMMUNITY): Payer: Self-pay

## 2019-08-12 ENCOUNTER — Encounter (HOSPITAL_COMMUNITY)
Admission: EM | Disposition: A | Discharge status: Home / Self Care | Payer: Self-pay | Source: Home / Self Care | Attending: Internal Medicine

## 2019-08-12 ENCOUNTER — Inpatient Hospital Stay (HOSPITAL_COMMUNITY): Payer: Medicare Other | Admitting: Anesthesiology

## 2019-08-12 ENCOUNTER — Ambulatory Visit: Admission: RE | Admit: 2019-08-12 | Payer: Medicare Other | Source: Ambulatory Visit

## 2019-08-12 HISTORY — PX: BIOPSY: SHX5522

## 2019-08-12 HISTORY — PX: ESOPHAGOGASTRODUODENOSCOPY (EGD) WITH PROPOFOL: SHX5813

## 2019-08-12 LAB — BASIC METABOLIC PANEL
Anion gap: 8 (ref 5–15)
BUN: 9 mg/dL (ref 8–23)
CO2: 22 mmol/L (ref 22–32)
Calcium: 8.4 mg/dL — ABNORMAL LOW (ref 8.9–10.3)
Chloride: 106 mmol/L (ref 98–111)
Creatinine, Ser: 0.94 mg/dL (ref 0.61–1.24)
GFR calc Af Amer: >60 mL/min (ref >60)
GFR calc non Af Amer: >60 mL/min (ref >60)
Glucose, Bld: 92 mg/dL (ref 70–99)
Potassium: 3.4 mmol/L — ABNORMAL LOW (ref 3.5–5.1)
Sodium: 136 mmol/L (ref 135–145)

## 2019-08-12 LAB — CBC
HCT: 28 % — ABNORMAL LOW (ref 39.0–52.0)
Hemoglobin: 9.1 g/dL — ABNORMAL LOW (ref 13.0–17.0)
MCH: 31.6 pg (ref 26.0–34.0)
MCHC: 32.5 g/dL (ref 30.0–36.0)
MCV: 97.2 fL (ref 80.0–100.0)
Platelets: 255 10*3/uL (ref 150–400)
RBC: 2.88 MIL/uL — ABNORMAL LOW (ref 4.22–5.81)
RDW: 16 % — ABNORMAL HIGH (ref 11.5–15.5)
WBC: 8.2 10*3/uL (ref 4.0–10.5)
nRBC: 0 % (ref 0.0–0.2)

## 2019-08-12 SURGERY — ESOPHAGOGASTRODUODENOSCOPY (EGD) WITH PROPOFOL
Anesthesia: Monitor Anesthesia Care

## 2019-08-12 MED ORDER — PROPOFOL 500 MG/50ML IV EMUL
INTRAVENOUS | Status: DC | PRN
  Administered 2019-08-12: 50 ug/kg/min via INTRAVENOUS

## 2019-08-12 MED ORDER — PROPOFOL 10 MG/ML IV BOLUS
INTRAVENOUS | Status: DC | PRN
  Administered 2019-08-12: 20 mg via INTRAVENOUS

## 2019-08-12 MED ORDER — SODIUM CHLORIDE 0.9 % IV SOLN: INTRAVENOUS | Status: DC

## 2019-08-12 MED ORDER — ZOLPIDEM TARTRATE 5 MG PO TABS
5.0000 mg | ORAL_TABLET | Freq: Once | ORAL | Status: AC
  Administered 2019-08-12: 5 mg via ORAL
  Filled 2019-08-12: qty 1

## 2019-08-12 MED ORDER — LACTATED RINGERS IV SOLN
INTRAVENOUS | Status: DC | PRN
  Administered 2019-08-12: 13:00:00 via INTRAVENOUS

## 2019-08-12 SURGICAL SUPPLY — 15 items

## 2019-08-12 NOTE — Anesthesia Preprocedure Evaluation (Addendum)
Anesthesia Evaluation  Patient identified by MRN, date of birth, ID band Patient awake    Reviewed: Allergy & Precautions, NPO status , Patient's Chart, lab work & pertinent test results  Airway Mallampati: I  TM Distance: >3 FB Neck ROM: Full    Dental no notable dental hx. (+) Teeth Intact, Dental Advisory Given   Pulmonary neg pulmonary ROS, former smoker,    Pulmonary exam normal breath sounds clear to auscultation       Cardiovascular negative cardio ROS Normal cardiovascular exam Rhythm:Regular Rate:Normal     Neuro/Psych PSYCHIATRIC DISORDERS Depression negative neurological ROS     GI/Hepatic negative GI ROS, Neg liver ROS,   Endo/Other  negative endocrine ROS  Renal/GU negative Renal ROS  negative genitourinary   Musculoskeletal  (+) Arthritis ,   Abdominal   Peds  Hematology  (+) Blood dyscrasia (Hgb 9.1), anemia ,   Anesthesia Other Findings EGD for melena  Reproductive/Obstetrics                            Anesthesia Physical Anesthesia Plan  ASA: III  Anesthesia Plan: MAC   Post-op Pain Management:    Induction: Intravenous  PONV Risk Score and Plan: 1 and Propofol infusion and Treatment may vary due to age or medical condition  Airway Management Planned: Natural Airway  Additional Equipment:   Intra-op Plan:   Post-operative Plan:   Informed Consent: I have reviewed the patients History and Physical, chart, labs and discussed the procedure including the risks, benefits and alternatives for the proposed anesthesia with the patient or authorized representative who has indicated his/her understanding and acceptance.     Dental advisory given  Plan Discussed with: CRNA  Anesthesia Plan Comments:         Anesthesia Quick Evaluation

## 2019-08-12 NOTE — Progress Notes (Signed)
08/12/2019 11:48 AM  Patient care giver called, [name], and requested to be added to the list for daily medical updates.   Spoke with patient who verbalized permission for medical information be shared with [name]. Informed Vann MD. CSW updated. Updated nursing.   Frazer [place] MSN, RN-BC, CNML [facility] Phone: [phone]

## 2019-08-12 NOTE — Brief Op Note (Signed)
08/10/2019 - 08/12/2019  1:12 PM  PATIENT:  Olena Leatherwood  73 y.o. male  PRE-OPERATIVE DIAGNOSIS:  Melena  POST-OPERATIVE DIAGNOSIS:  EGD- non bleeding esophageal ulcer, gastritis, duodenitis, duodenal ulcer-biopsied  PROCEDURE:  Procedure(s): ESOPHAGOGASTRODUODENOSCOPY (EGD) WITH PROPOFOL (N/A) BIOPSY  SURGEON:  Surgeon(s) and Role:    * Ayvah Caroll, MD - Primary  Findings ------------- -EGD showed large duodenal sweep ulcer with clean-based  along with esophageal ulceration and gastritis.  Recommendations -------------------------- -Advance diet to soft -Continue IV twice daily PPI for 1 more day, switch to oral Protonix 40 mg twice a day for next 2 weeks followed by Protonix 40 mg once a day. -Recommend repeat EGD in 8 weeks to document healing of esophageal and duodenal ulcer -GI will follow tomorrow  Otis Brace MD, FACP 08/12/2019, 1:15 PM  Contact #  [phone]

## 2019-08-12 NOTE — Op Note (Addendum)
[facility] Patient Name: Jose Bender Procedure Date : 08/12/2019 MRN: [id number] Attending MD: Otis Brace , MD Date of Birth: 1946/10/08 CSN: [id number] Age: 73 Admit Type: Inpatient Procedure:                Upper GI endoscopy Indications:              Melena Providers:                Otis Brace, MD, Jeanella Cara, RN,                            Marguerita Merles, Technician, Lazaro Arms, Technician Referring MD:              Medicines:                Sedation Administered by an Anesthesia Professional Complications:            No immediate complications. Estimated Blood Loss:     Estimated blood loss was minimal. Procedure:                Pre-Anesthesia Assessment:                           - Prior to the procedure, a History and Physical                            was performed, and patient medications and                            allergies were reviewed. The patient's tolerance of                            previous anesthesia was also reviewed. The risks                            and benefits of the procedure and the sedation                            options and risks were discussed with the patient.                            All questions were answered, and informed consent                            was obtained. Prior Anticoagulants: The patient has                            taken no previous anticoagulant or antiplatelet                            agents. ASA Grade Assessment: III - A patient with                            severe systemic disease. After reviewing the risks  and benefits, the patient was deemed in                            satisfactory condition to undergo the procedure.                           After obtaining informed consent, the endoscope was                            passed under direct vision. Throughout the                            procedure, the patient's blood pressure, pulse,  and                            oxygen saturations were monitored continuously. The                            [id number] Olympus gastroscope was                            introduced through the mouth, and advanced to the                            second part of duodenum. The upper GI endoscopy was                            accomplished without difficulty. The patient                            tolerated the procedure well. Scope In: Scope Out: Findings:      Three superficial esophageal ulcers with no bleeding and no stigmata of       recent bleeding were found at the gastroesophageal junction.      A small hiatal hernia was present.      Diffuse moderate inflammation characterized by congestion (edema),       erythema and friability was found in the entire examined stomach.       Biopsies were taken with a cold forceps for histology.      The cardia and gastric fundus were normal on retroflexion.      One partially obstructing non-bleeding cratered duodenal ulcer with       clean base was found in the first portion of the duodenum/duodenal       sweep. The lesion was 20 mm in largest dimension. There is no evidence       of perforation.      Mild inflammation was found in the duodenal bulb.      The second portion of the duodenum was normal. Impression:               - Non-bleeding esophageal ulcers.                           - Small hiatal hernia.                           -  Gastritis. Biopsied.                           - Partially obstructing non-bleeding duodenal ulcer                            with clean base. There is no evidence of                            perforation.                           - Duodenitis.                           - Normal second portion of the duodenum. Recommendation:           - Return patient to hospital ward for ongoing care.                           - Soft diet.                           - Continue present medications.                            - No aspirin, ibuprofen, naproxen, or other                            non-steroidal anti-inflammatory drugs.                           - Await pathology results.                           - Repeat upper endoscopy in 8 weeks to check                            healing.                           - Return to my office in 6 weeks. Procedure Code(s):        --- Professional ---                           [id number], Esophagogastroduodenoscopy, flexible,                            transoral; with biopsy, single or multiple Diagnosis Code(s):        --- Professional ---                           K22.10, Ulcer of esophagus without bleeding                           K44.9, Diaphragmatic hernia without obstruction or  gangrene                           K29.70, Gastritis, unspecified, without bleeding                           K26.9, Duodenal ulcer, unspecified as acute or                            chronic, without hemorrhage or perforation                           K29.80, Duodenitis without bleeding                           K92.1, Melena (includes Hematochezia) CPT copyright 2019 American Medical Association. All rights reserved. The codes documented in this report are preliminary and upon coder review may  be revised to meet current compliance requirements. Otis Brace, MD Otis Brace, MD 08/12/2019 1:12:23 PM Number of Addenda: 0

## 2019-08-12 NOTE — Progress Notes (Signed)
Progress Note    Jose Bender  [id number] DOB: 09/27/1946  DOA: 08/10/2019 PCP: Vernie Shanks, MD    Brief Narrative:     Medical records reviewed and are as summarized below:  Jose Bender is an 73 y.o. male  with medical history significant forchronic low back pain status post lumbar surgery in August, now presenting to the emergency department for evaluation of upper abdominal pain and dark tarry stools. Patient reports intermittent sharp upper abdominal pain for the past couple weeks, has had loose stool, and reports that it is dark and tarry.   Assessment/Plan:   Principal Problem:   Upper GI bleeding Active Problems:   Diverticulitis of duodenum   Chronic low back pain   Hypokalemia   Normocytic anemia   Acute upper GI bleed   Acute blood loss anemia Acute diverticulitis of duodenum with bleeding -Presents with upper abdominal pain and melena -his HGB was14.4 in August-- now in the 9s but has stabilized - melena on DRE -CT findings concerning for diverticulitis involving second portion of duodenum without perforation or drainable fluid collection -Continue bowel-rest, antibiotics, pain-control, start IV Protonix q12h -GI consult(Dr. Alessandra Bevels) for EGD today -denies NSAIDs  Alcohol use -CIWA (scoring <5) -denies heavy use  Hypokalemia -Serum potassium is 2.9 in ED -Mg ok -replete when able to take PO  Chronic low back pain -Minimal back pain and resolution of leg pain following surgery in August, now only using his pain medications for the upper abdominal pain -denies NSAID use    Family Communication/Anticipated D/C date and plan/Code Status   DVT prophylaxis: scd Code Status: Full Code.  Family Communication:  Disposition Plan: getting EGD today as well as continuation of abx   Medical Consultants:    GI  Subjective:   Still having dark stools but pain and nausea improved  Objective:    Vitals:   08/11/19  1311 08/11/19 2147 08/12/19 0350 08/12/19 1026  BP: 131/74 124/70 (!) 103/50 120/74  Pulse: 69 76 77 82  Resp: 18 18 18 18   Temp: 98.7 F (37.1 C) 98.4 F (36.9 C) 98.2 F (36.8 C) 98.3 F (36.8 C)  TempSrc: Oral Oral Oral Oral  SpO2: 100%  99% 100%  Weight:   78.5 kg     Intake/Output Summary (Last 24 hours) at 08/12/2019 1054 Last data filed at 08/12/2019 0810 Gross per 24 hour  Intake 437.81 ml  Output -  Net 437.81 ml   Filed Weights   08/12/19 0350  Weight: 78.5 kg    Exam: In bed, resting rrr No increased work of breathing +BS, soft No LE edema  Data Reviewed:   I have personally reviewed following labs and imaging studies:  Labs: Labs show the following:   Basic Metabolic Panel: Recent Labs  Lab 08/10/19 1653 08/11/19 0600 08/12/19 0701  NA 135 137 136  K 2.9* 3.2* 3.4*  CL 102 104 106  CO2 18* 20* 22  GLUCOSE 134* 108* 92  BUN 25* 19 9  CREATININE 1.17 0.86 0.94  CALCIUM 9.0 8.5* 8.4*  MG  --  2.1  --    GFR Estimated Creatinine Clearance: 71 mL/min (by C-G formula based on SCr of 0.94 mg/dL). Liver Function Tests: Recent Labs  Lab 08/10/19 1653 08/11/19 0600  AST 19 15  ALT 15 13  ALKPHOS 51 45  BILITOT 0.8 0.7  PROT 6.7 5.7*  ALBUMIN 3.7 3.4*   Recent Labs  Lab 08/10/19 1718  LIPASE 36  No results for input(s): AMMONIA in the last 168 hours. Coagulation profile No results for input(s): INR, PROTIME in the last 168 hours.  CBC: Recent Labs  Lab 08/10/19 1653 08/11/19 0408 08/11/19 1133 08/12/19 0701  WBC 9.6  --   --  8.2  HGB 11.1* 9.6* 10.3* 9.1*  HCT 33.4* 28.9* 31.6* 28.0*  MCV 95.7  --   --  97.2  PLT 367  --   --  255   Cardiac Enzymes: No results for input(s): CKTOTAL, CKMB, CKMBINDEX, TROPONINI in the last 168 hours. BNP (last 3 results) No results for input(s): PROBNP in the last 8760 hours. CBG: Recent Labs  Lab 08/11/19 0832  GLUCAP 90   D-Dimer: No results for input(s): DDIMER in the last 72  hours. Hgb A1c: No results for input(s): HGBA1C in the last 72 hours. Lipid Profile: No results for input(s): CHOL, HDL, LDLCALC, TRIG, CHOLHDL, LDLDIRECT in the last 72 hours. Thyroid function studies: No results for input(s): TSH, T4TOTAL, T3FREE, THYROIDAB in the last 72 hours.  Invalid input(s): FREET3 Anemia work up: No results for input(s): VITAMINB12, FOLATE, FERRITIN, TIBC, IRON, RETICCTPCT in the last 72 hours. Sepsis Labs: Recent Labs  Lab 08/10/19 1653 08/12/19 0701  WBC 9.6 8.2    Microbiology Recent Results (from the past 240 hour(s))  SARS CORONAVIRUS 2 (TAT 6-24 HRS) Nasopharyngeal Nasopharyngeal Swab     Status: None   Collection Time: 08/10/19 10:55 PM   Specimen: Nasopharyngeal Swab  Result Value Ref Range Status   SARS Coronavirus 2 NEGATIVE NEGATIVE Final    Comment: (NOTE) SARS-CoV-2 target nucleic acids are NOT DETECTED. The SARS-CoV-2 RNA is generally detectable in upper and lower respiratory specimens during the acute phase of infection. Negative results do not preclude SARS-CoV-2 infection, do not rule out co-infections with other pathogens, and should not be used as the sole basis for treatment or other patient management decisions. Negative results must be combined with clinical observations, patient history, and epidemiological information. The expected result is Negative. Fact Sheet for Patients: SugarRoll.be Fact Sheet for Healthcare Providers: https://www.woods-mathews.com/ This test is not yet approved or cleared by the [place] FDA and  has been authorized for detection and/or diagnosis of SARS-CoV-2 by FDA under an Emergency Use Authorization (EUA). This EUA will remain  in effect (meaning this test can be used) for the duration of the COVID-19 declaration under Section 56 4(b)(1) of the Act, 21 U.S.C. section 360bbb-3(b)(1), unless the authorization is terminated or revoked sooner.  Performed at [facility], [place] [address], [place], [place] [address]     Procedures and diagnostic studies:  Ct Abdomen Pelvis W Contrast  Result Date: 08/10/2019 CLINICAL DATA:  73 year old male with upper abdominal pain. EXAM: CT ABDOMEN AND PELVIS WITH CONTRAST TECHNIQUE: Multidetector CT imaging of the abdomen and pelvis was performed using the standard protocol following bolus administration of intravenous contrast. CONTRAST:  124mL OMNIPAQUE IOHEXOL 300 MG/ML  SOLN COMPARISON:  None. FINDINGS: Lower chest: The visualized lung bases are clear. No intra-abdominal free air or free fluid. Hepatobiliary: Multiple scattered small hepatic hypodense lesions are too small to characterize but demonstrate fluid attenuation, likely cysts. No intrahepatic biliary ductal dilatation. No calcified gallstone. Pancreas: Mild inflammatory changes of the head of the pancreas, likely related to inflammation of the duodenal diverticulum. Acute pancreatitis as the cause of the inflammation is less likely. Correlation with pancreatic enzymes recommended. Spleen: Normal in size without focal abnormality. Adrenals/Urinary Tract: The adrenal glands are unremarkable.  There is no hydronephrosis on either side. There is symmetric enhancement and excretion of contrast by both kidneys. Several small bilateral renal hypodense lesions measure up to 15 mm in the upper pole of the right kidney. These lesions are suboptimally characterized but demonstrate fluid attenuation most consistent with cysts. Ultrasound may provide better evaluation on a nonemergent basis. The visualized ureters and urinary bladder appear unremarkable. Stomach/Bowel: There is a small hiatal hernia. There is a 2 cm duodenal diverticulum at the head of the pancreas. There is inflammatory changes of the duodenal diverticulum most consistent with diverticulitis. No evidence of perforation. No drainable fluid collection or abscess. There is no bowel  obstruction. Loose stool noted in the proximal colon compatible with diarrheal state. The appendix is not visualized with certainty. No inflammatory changes identified in the right lower quadrant. Vascular/Lymphatic: Mild atherosclerotic calcification the aorta. The IVC is unremarkable. No portal venous gas. There is no adenopathy. Reproductive: The prostate and seminal vesicles are grossly unremarkable. Other: Small fat containing umbilical hernia. Musculoskeletal: Degenerative changes of the spine. No acute osseous pathology. IMPRESSION: 1. Findings most consistent with diverticulitis of the second portion the duodenum with mild reactive inflammation of the head of the pancreas. Correlation with clinical exam and pancreatic enzymes recommended. No drainable fluid collection or evidence of perforation. 2. Diarrheal state. No bowel obstruction. Aortic Atherosclerosis (ICD10-I70.0). Electronically Signed   By: Anner Crete M.D.   On: 08/10/2019 22:32    Medications:   . folic acid  1 mg Oral Daily  . multivitamin with minerals  1 tablet Oral Daily  . pantoprazole (PROTONIX) IV  40 mg Intravenous Q12H  . sodium chloride flush  3 mL Intravenous Q12H  . thiamine  100 mg Oral Daily   Or  . thiamine  100 mg Intravenous Daily   Continuous Infusions: . piperacillin-tazobactam (ZOSYN)  IV 3.375 g (08/12/19 0607)     LOS: 1 day   Geradine Girt  [facility]   How to contact the [facility] Attending or Consulting provider [place] or covering provider during after hours [place], for this patient?  1. Check the care team in [facility] and look for a) attending/consulting [facility] provider listed and b) the [facility] team listed 2. Log into [email or web address] and use [place]'s universal password to access. If you do not have the password, please contact the hospital operator. 3. Locate the [facility] provider you are looking for under [facility] and page to a number that you can be directly reached. 4. If you still  have difficulty reaching the provider, please page the [facility] (Director on Call) for the Hospitalists listed on amion for assistance.  08/12/2019, 10:54 AM

## 2019-08-12 NOTE — Anesthesia Postprocedure Evaluation (Signed)
Anesthesia Post Note  Patient: Jose Bender  Procedure(s) Performed: ESOPHAGOGASTRODUODENOSCOPY (EGD) WITH PROPOFOL (N/A ) BIOPSY     Patient location during evaluation: Endoscopy Anesthesia Type: MAC Level of consciousness: awake and alert Pain management: pain level controlled Vital Signs Assessment: post-procedure vital signs reviewed and stable Respiratory status: spontaneous breathing, nonlabored ventilation, respiratory function stable and patient connected to nasal cannula oxygen Cardiovascular status: blood pressure returned to baseline and stable Postop Assessment: no apparent nausea or vomiting Anesthetic complications: no    Last Vitals:  Vitals:   08/12/19 1320 08/12/19 1621  BP: (!) 130/59 111/60  Pulse: 69 92  Resp: 18 18  Temp:  37.4 C  SpO2: 100% 100%    Last Pain:  Vitals:   08/12/19 1621  TempSrc: Oral  PainSc:                  [name]

## 2019-08-12 NOTE — Interval H&P Note (Signed)
History and Physical Interval Note:  08/12/2019 12:47 PM  Jose Bender  has presented today for surgery, with the diagnosis of Melena and duodenal diverticulitis.  The various methods of treatment have been discussed with the patient and family. After consideration of risks, benefits and other options for treatment, the patient has consented to  Procedure(s): ESOPHAGOGASTRODUODENOSCOPY (EGD) WITH PROPOFOL (N/A) as a surgical intervention.  The patient's history has been reviewed, patient examined, no change in status, stable for surgery.  I have reviewed the patient's chart and labs.  Questions were answered to the patient's satisfaction.     [name]

## 2019-08-12 NOTE — Anesthesia Procedure Notes (Signed)
Procedure Name: MAC Date/Time: 08/12/2019 12:53 PM Performed by: Neldon Newport, CRNA Pre-anesthesia Checklist: Patient being monitored, Timeout performed, Suction available, Emergency Drugs available and Patient identified Patient Re-evaluated:Patient Re-evaluated prior to induction Oxygen Delivery Method: Nasal cannula

## 2019-08-12 NOTE — Transfer of Care (Signed)
Immediate Anesthesia Transfer of Care Note  Patient: Jose Bender  Procedure(s) Performed: ESOPHAGOGASTRODUODENOSCOPY (EGD) WITH PROPOFOL (N/A ) BIOPSY  Patient Location: Endoscopy Unit  Anesthesia Type:MAC  Level of Consciousness: awake, alert  and oriented  Airway & Oxygen Therapy: Patient Spontanous Breathing and Patient connected to nasal cannula oxygen  Post-op Assessment: Report given to RN, Post -op Vital signs reviewed and stable and Patient moving all extremities X 4  Post vital signs: Reviewed and stable  Last Vitals:  Vitals Value Taken Time  BP    Temp    Pulse    Resp    SpO2      Last Pain:  Vitals:   08/12/19 1234  TempSrc: Oral  PainSc: 2       Patients Stated Pain Goal: 2 ([id number])  Complications: No apparent anesthesia complications

## 2019-08-13 DIAGNOSIS — K5712 Diverticulitis of small intestine without perforation or abscess without bleeding: Secondary | ICD-10-CM

## 2019-08-13 DIAGNOSIS — D62 Acute posthemorrhagic anemia: Secondary | ICD-10-CM

## 2019-08-13 DIAGNOSIS — M545 Low back pain: Secondary | ICD-10-CM

## 2019-08-13 DIAGNOSIS — G8929 Other chronic pain: Secondary | ICD-10-CM

## 2019-08-13 DIAGNOSIS — K922 Gastrointestinal hemorrhage, unspecified: Secondary | ICD-10-CM

## 2019-08-13 LAB — CBC
HCT: 27.8 % — ABNORMAL LOW (ref 39.0–52.0)
Hemoglobin: 8.8 g/dL — ABNORMAL LOW (ref 13.0–17.0)
MCH: 31.2 pg (ref 26.0–34.0)
MCHC: 31.7 g/dL (ref 30.0–36.0)
MCV: 98.6 fL (ref 80.0–100.0)
Platelets: 263 10*3/uL (ref 150–400)
RBC: 2.82 MIL/uL — ABNORMAL LOW (ref 4.22–5.81)
RDW: 15.9 % — ABNORMAL HIGH (ref 11.5–15.5)
WBC: 8.2 10*3/uL (ref 4.0–10.5)
nRBC: 0 % (ref 0.0–0.2)

## 2019-08-13 LAB — GLUCOSE, CAPILLARY: Glucose-Capillary: 90 mg/dL (ref 70–99)

## 2019-08-13 MED ORDER — PANTOPRAZOLE SODIUM 40 MG PO TBEC: 40.0000 mg | DELAYED_RELEASE_TABLET | Freq: Two times a day (BID) | ORAL | 0 refills | Status: DC

## 2019-08-13 MED ORDER — FOLIC ACID 1 MG PO TABS: 1.0000 mg | ORAL_TABLET | Freq: Every day | ORAL | 0 refills | Status: DC

## 2019-08-13 MED ORDER — METRONIDAZOLE 500 MG PO TABS: 500.0000 mg | ORAL_TABLET | Freq: Three times a day (TID) | ORAL | 0 refills | Status: AC

## 2019-08-13 MED ORDER — THIAMINE HCL 100 MG PO TABS: 100.0000 mg | ORAL_TABLET | Freq: Every day | ORAL | 0 refills | Status: DC

## 2019-08-13 MED ORDER — CIPROFLOXACIN HCL 500 MG PO TABS: 500.0000 mg | ORAL_TABLET | Freq: Two times a day (BID) | ORAL | 0 refills | Status: AC

## 2019-08-13 MED ORDER — ONDANSETRON HCL 4 MG PO TABS: 4.0000 mg | ORAL_TABLET | Freq: Four times a day (QID) | ORAL | 0 refills | Status: DC | PRN

## 2019-08-13 MED ORDER — ADULT MULTIVITAMIN W/MINERALS CH: 1.0000 | ORAL_TABLET | Freq: Every day | ORAL | Status: AC

## 2019-08-13 NOTE — Progress Notes (Signed)
CSW was contacted by the RN about the patient. [name], the patient's care taker and friend had some concerns and wanted to speak with the CSW.   CSW called Collier Salina and spoke with him about his concerns. He stated that he is his friend, care taker, and land lord. He stated that he had brought the patient to the ED after finding him at home. He stated that he hadn't had any food or water in three days.   Collier Salina stated he has concerned about the patient returning home by hisself. He stated that the patient has some hoarding going on. He stated that the patient has mental health issues that he is addressing in therapy. He stated that he will take him to medical appointments and make sure he has food in the home.   He asked what they could do should the patient to continue to decline. CSW stated that he could make an APS report or they could follow up with the PCP for long-term care placement should he decline. Collier Salina asked if the CSW could make the APS report. CSW stated that she could but she stated that APS could screen the report in or out.   He thanked the CSW for her time. CSW called and made an APS report.   Domenic Schwab, MSW, [place] Worker [facility]  [phone]

## 2019-08-13 NOTE — Discharge Summary (Signed)
Physician Discharge Summary  Jose Bender [id number] DOB: [birth year] DOA: 08/10/2019  PCP: Vernie Shanks, MD  Admit date: 08/10/2019 Discharge date: 08/13/2019  Admitted From: Home Disposition: Home  Recommendations for Outpatient Follow-up:  1. Follow up with PCP in 1 week with repeat CBC/BMP 2. Outpatient follow-up with GI 3. Follow up in ED if symptoms worsen or new appear   Home Health: No Equipment/Devices: None  Discharge Condition: Stable CODE STATUS: Full Diet recommendation: Heart healthy  Brief/Interim Summary: 73 year old male with history of chronic low back pain status post lumbar surgery in August presented with abdominal pain and dark tarry stools.  CT of the abdomen showed diverticulitis involving second portion of duodenum without perforation or drainable fluid collection.  He was started on Protonix intravenously.  He underwent EGD on 08/12/2019.  His hemoglobin is stable.  GI has cleared the patient for discharge.  He will be discharged on oral Protonix twice a day and oral antibiotics.  Discharge Diagnoses:   Acute blood loss anemia Probable upper GI bleeding most likely from esophageal ulcers/gastritis/duodenitis/duodenal ulcer Acute diverticulitis of duodenum  -Presented with upper abdominal pain with melena -Hemoglobin was 14.4 in August and presented in the 9s.  Hemoglobin 8.8 this morning. --CT findings concerning for diverticulitis involving second portion of duodenum without perforation or drainable fluid collection -Treated with intravenous Protonix. -Underwent EGD on 08/12/2019 which showed esophageal ulcer/gastritis/duodenitis/duodenal ulcer.  Biopsy is pending.  This can be followed up as an outpatient by GI.  No further melena since admission.  Tolerating diet.  GI has cleared the patient for discharge -Currently on Zosyn.  Will discharge on 5 more days of oral ciprofloxacin and Flagyl. -Discharge on her oral Protonix 40 mg twice a day for now.   Outpatient follow-up with GI.  He will need repeat upper endoscopy in 8 weeks. -Avoid NSAIDs.  Alcohol use -Denies abuse.  No signs of withdrawal.  Continue multivitamin, thiamine and folic acid on discharge.  Chronic low back pain -Outpatient follow-up.    Discharge Instructions  Discharge Instructions    Diet - low sodium heart healthy   Complete by: As directed    Increase activity slowly   Complete by: As directed      Allergies as of 08/13/2019      Reactions   Codeine Nausea Only      Medication List    STOP taking these medications   methocarbamol 500 MG tablet Commonly known as: ROBAXIN     TAKE these medications   acetaminophen 500 MG tablet Commonly known as: TYLENOL Take 500 mg by mouth every 8 (eight) hours as needed (pain).   baclofen 10 MG tablet Commonly known as: LIORESAL Take 5 mg by mouth 2 (two) times daily.   ciprofloxacin 500 MG tablet Commonly known as: Cipro Take 1 tablet (500 mg total) by mouth 2 (two) times daily for 5 days.   folic acid 1 MG tablet Commonly known as: FOLVITE Take 1 tablet (1 mg total) by mouth daily.   gabapentin 300 MG capsule Commonly known as: NEURONTIN Take 300 mg by mouth 3 (three) times daily.   metroNIDAZOLE 500 MG tablet Commonly known as: Flagyl Take 1 tablet (500 mg total) by mouth 3 (three) times daily for 5 days.   multivitamin with minerals Tabs tablet Take 1 tablet by mouth daily.   ondansetron 4 MG tablet Commonly known as: ZOFRAN Take 1 tablet (4 mg total) by mouth every 6 (six) hours as needed for nausea.  pantoprazole 40 MG tablet Commonly known as: Protonix Take 1 tablet (40 mg total) by mouth 2 (two) times daily before a meal.   sertraline 100 MG tablet Commonly known as: ZOLOFT Take 150 mg by mouth daily.   thiamine 100 MG tablet Take 1 tablet (100 mg total) by mouth daily.      Follow-up Information    Vernie Shanks, MD. Schedule an appointment as soon as possible for a  visit in 1 week(s).   Specialty: Family Medicine Why: with repeat CBC/BMP Contact information: [place] [address] [phone]        Otis Brace, MD. Schedule an appointment as soon as possible for a visit in 1 week(s).   Specialty: Gastroenterology Contact information: [place] [address] [phone]          Allergies  Allergen Reactions  . Codeine Nausea Only    Consultations:  GI   Procedures/Studies: Ct Abdomen Pelvis W Contrast  Result Date: 08/10/2019 CLINICAL DATA:  73 year old male with upper abdominal pain. EXAM: CT ABDOMEN AND PELVIS WITH CONTRAST TECHNIQUE: Multidetector CT imaging of the abdomen and pelvis was performed using the standard protocol following bolus administration of intravenous contrast. CONTRAST:  153mL OMNIPAQUE IOHEXOL 300 MG/ML  SOLN COMPARISON:  None. FINDINGS: Lower chest: The visualized lung bases are clear. No intra-abdominal free air or free fluid. Hepatobiliary: Multiple scattered small hepatic hypodense lesions are too small to characterize but demonstrate fluid attenuation, likely cysts. No intrahepatic biliary ductal dilatation. No calcified gallstone. Pancreas: Mild inflammatory changes of the head of the pancreas, likely related to inflammation of the duodenal diverticulum. Acute pancreatitis as the cause of the inflammation is less likely. Correlation with pancreatic enzymes recommended. Spleen: Normal in size without focal abnormality. Adrenals/Urinary Tract: The adrenal glands are unremarkable. There is no hydronephrosis on either side. There is symmetric enhancement and excretion of contrast by both kidneys. Several small bilateral renal hypodense lesions measure up to 15 mm in the upper pole of the right kidney. These lesions are suboptimally characterized but demonstrate fluid attenuation most consistent with cysts. Ultrasound may provide better evaluation on a nonemergent basis.  The visualized ureters and urinary bladder appear unremarkable. Stomach/Bowel: There is a small hiatal hernia. There is a 2 cm duodenal diverticulum at the head of the pancreas. There is inflammatory changes of the duodenal diverticulum most consistent with diverticulitis. No evidence of perforation. No drainable fluid collection or abscess. There is no bowel obstruction. Loose stool noted in the proximal colon compatible with diarrheal state. The appendix is not visualized with certainty. No inflammatory changes identified in the right lower quadrant. Vascular/Lymphatic: Mild atherosclerotic calcification the aorta. The IVC is unremarkable. No portal venous gas. There is no adenopathy. Reproductive: The prostate and seminal vesicles are grossly unremarkable. Other: Small fat containing umbilical hernia. Musculoskeletal: Degenerative changes of the spine. No acute osseous pathology. IMPRESSION: 1. Findings most consistent with diverticulitis of the second portion the duodenum with mild reactive inflammation of the head of the pancreas. Correlation with clinical exam and pancreatic enzymes recommended. No drainable fluid collection or evidence of perforation. 2. Diarrheal state. No bowel obstruction. Aortic Atherosclerosis (ICD10-I70.0). Electronically Signed   By: Anner Crete M.D.   On: 08/10/2019 22:32    EGD Impression:               - Non-bleeding esophageal ulcers.                           -  Small hiatal hernia.                           - Gastritis. Biopsied.                           - Partially obstructing non-bleeding duodenal ulcer                            with clean base. There is no evidence of                            perforation.                           - Duodenitis.                           - Normal second portion of the duodenum. Recommendation:           - Return patient to hospital ward for ongoing care.                           - Soft diet.                           -  Continue present medications.                           - No aspirin, ibuprofen, naproxen, or other                            non-steroidal anti-inflammatory drugs.                           - Await pathology results.                           - Repeat upper endoscopy in 8 weeks to check                            healing.                           - Return to my office in 6 weeks.   Subjective: Patient seen and examined at bedside.  He denies any overnight fever, nausea, vomiting, black or bloody stools.  He is tolerating diet.  Discharge Exam: Vitals:   08/13/19 0342 08/13/19 0926  BP: 113/64 113/65  Pulse: 87 81  Resp: 18 20  Temp: 99.2 F (37.3 C) 98.6 F (37 C)  SpO2: 100% 99%    General: Pt is alert, awake, not in acute distress.  Poor historian. Cardiovascular: rate controlled, S1/S2 + Respiratory: bilateral decreased breath sounds at bases Abdominal: Soft, NT, ND, bowel sounds + Extremities: no edema, no cyanosis    The results of significant diagnostics from this hospitalization (including imaging, microbiology, ancillary and laboratory) are listed below for reference.     Microbiology: Recent Results (from the past 240 hour(s))  SARS CORONAVIRUS 2 (TAT 6-24 HRS)  Nasopharyngeal Nasopharyngeal Swab     Status: None   Collection Time: 08/10/19 10:55 PM   Specimen: Nasopharyngeal Swab  Result Value Ref Range Status   SARS Coronavirus 2 NEGATIVE NEGATIVE Final    Comment: (NOTE) SARS-CoV-2 target nucleic acids are NOT DETECTED. The SARS-CoV-2 RNA is generally detectable in upper and lower respiratory specimens during the acute phase of infection. Negative results do not preclude SARS-CoV-2 infection, do not rule out co-infections with other pathogens, and should not be used as the sole basis for treatment or other patient management decisions. Negative results must be combined with clinical observations, patient history, and epidemiological information. The  expected result is Negative. Fact Sheet for Patients: SugarRoll.be Fact Sheet for Healthcare Providers: https://www.woods-mathews.com/ This test is not yet approved or cleared by the [place] FDA and  has been authorized for detection and/or diagnosis of SARS-CoV-2 by FDA under an Emergency Use Authorization (EUA). This EUA will remain  in effect (meaning this test can be used) for the duration of the COVID-19 declaration under Section 56 4(b)(1) of the Act, 21 U.S.C. section 360bbb-3(b)(1), unless the authorization is terminated or revoked sooner. Performed at [facility], [place] [address], [place], [place] [address]      Labs: BNP (last 3 results) No results for input(s): BNP in the last 8760 hours. Basic Metabolic Panel: Recent Labs  Lab 08/10/19 1653 08/11/19 0600 08/12/19 0701  NA 135 137 136  K 2.9* 3.2* 3.4*  CL 102 104 106  CO2 18* 20* 22  GLUCOSE 134* 108* 92  BUN 25* 19 9  CREATININE 1.17 0.86 0.94  CALCIUM 9.0 8.5* 8.4*  MG  --  2.1  --    Liver Function Tests: Recent Labs  Lab 08/10/19 1653 08/11/19 0600  AST 19 15  ALT 15 13  ALKPHOS 51 45  BILITOT 0.8 0.7  PROT 6.7 5.7*  ALBUMIN 3.7 3.4*   Recent Labs  Lab 08/10/19 1718  LIPASE 36   No results for input(s): AMMONIA in the last 168 hours. CBC: Recent Labs  Lab 08/10/19 1653 08/11/19 0408 08/11/19 1133 08/12/19 0701 08/13/19 0554  WBC 9.6  --   --  8.2 8.2  HGB 11.1* 9.6* 10.3* 9.1* 8.8*  HCT 33.4* 28.9* 31.6* 28.0* 27.8*  MCV 95.7  --   --  97.2 98.6  PLT 367  --   --  255 263   Cardiac Enzymes: No results for input(s): CKTOTAL, CKMB, CKMBINDEX, TROPONINI in the last 168 hours. BNP: Invalid input(s): POCBNP CBG: Recent Labs  Lab 08/11/19 0832 08/13/19 0647  GLUCAP 90 90   D-Dimer No results for input(s): DDIMER in the last 72 hours. Hgb A1c No results for input(s): HGBA1C in the last 72 hours. Lipid Profile No results  for input(s): CHOL, HDL, LDLCALC, TRIG, CHOLHDL, LDLDIRECT in the last 72 hours. Thyroid function studies No results for input(s): TSH, T4TOTAL, T3FREE, THYROIDAB in the last 72 hours.  Invalid input(s): FREET3 Anemia work up No results for input(s): VITAMINB12, FOLATE, FERRITIN, TIBC, IRON, RETICCTPCT in the last 72 hours. Urinalysis    Component Value Date/Time   COLORURINE YELLOW (A) 06/23/2019 1522   APPEARANCEUR HAZY (A) 06/23/2019 1522   LABSPEC 1.029 06/23/2019 1522   PHURINE 5.0 06/23/2019 1522   GLUCOSEU NEGATIVE 06/23/2019 1522   HGBUR SMALL (A) 06/23/2019 1522   BILIRUBINUR SMALL (A) 06/23/2019 1522   KETONESUR NEGATIVE 06/23/2019 1522   PROTEINUR NEGATIVE 06/23/2019 1522   NITRITE NEGATIVE 06/23/2019 1522  LEUKOCYTESUR NEGATIVE 06/23/2019 1522   Sepsis Labs Invalid input(s): PROCALCITONIN,  WBC,  LACTICIDVEN Microbiology Recent Results (from the past 240 hour(s))  SARS CORONAVIRUS 2 (TAT 6-24 HRS) Nasopharyngeal Nasopharyngeal Swab     Status: None   Collection Time: 08/10/19 10:55 PM   Specimen: Nasopharyngeal Swab  Result Value Ref Range Status   SARS Coronavirus 2 NEGATIVE NEGATIVE Final    Comment: (NOTE) SARS-CoV-2 target nucleic acids are NOT DETECTED. The SARS-CoV-2 RNA is generally detectable in upper and lower respiratory specimens during the acute phase of infection. Negative results do not preclude SARS-CoV-2 infection, do not rule out co-infections with other pathogens, and should not be used as the sole basis for treatment or other patient management decisions. Negative results must be combined with clinical observations, patient history, and epidemiological information. The expected result is Negative. Fact Sheet for Patients: SugarRoll.be Fact Sheet for Healthcare Providers: https://www.woods-mathews.com/ This test is not yet approved or cleared by the [place] FDA and  has been authorized for  detection and/or diagnosis of SARS-CoV-2 by FDA under an Emergency Use Authorization (EUA). This EUA will remain  in effect (meaning this test can be used) for the duration of the COVID-19 declaration under Section 56 4(b)(1) of the Act, 21 U.S.C. section 360bbb-3(b)(1), unless the authorization is terminated or revoked sooner. Performed at [facility], [place] [address] [place], [place] [address]      Time coordinating discharge: 35 minutes  SIGNED:   Aline August, MD  [facility] 08/13/2019, 11:48 AM

## 2019-08-13 NOTE — Progress Notes (Signed)
DISCHARGE NOTE HOME Jose Bender to be discharged Home per MD order. Discussed prescriptions and follow up appointments with the patient and his care giver [name]). Prescriptions given to patient; medication list explained in detail. Patient verbalized understanding.  Skin clean, dry and intact without evidence of skin break down, no evidence of skin tears noted. IV catheter discontinued intact. Site without signs and symptoms of complications. Dressing and pressure applied. Pt denies pain at the site currently. No complaints noted.  Patient free of lines, drains, and wounds.   An After Visit Summary (AVS) was printed and given to the patient. Patient escorted via wheelchair, and discharged home via private auto.  [name], RN3

## 2019-08-14 ENCOUNTER — Encounter (HOSPITAL_COMMUNITY): Payer: Self-pay | Admitting: Gastroenterology

## 2019-08-15 LAB — SURGICAL PATHOLOGY

## 2019-08-17 DIAGNOSIS — F410 Panic disorder [episodic paroxysmal anxiety] without agoraphobia: Secondary | ICD-10-CM | POA: Diagnosis not present

## 2019-08-17 DIAGNOSIS — F331 Major depressive disorder, recurrent, moderate: Secondary | ICD-10-CM | POA: Diagnosis not present

## 2019-08-31 DIAGNOSIS — F331 Major depressive disorder, recurrent, moderate: Secondary | ICD-10-CM | POA: Diagnosis not present

## 2019-08-31 DIAGNOSIS — F410 Panic disorder [episodic paroxysmal anxiety] without agoraphobia: Secondary | ICD-10-CM | POA: Diagnosis not present

## 2019-09-07 DIAGNOSIS — F410 Panic disorder [episodic paroxysmal anxiety] without agoraphobia: Secondary | ICD-10-CM | POA: Diagnosis not present

## 2019-09-07 DIAGNOSIS — F331 Major depressive disorder, recurrent, moderate: Secondary | ICD-10-CM | POA: Diagnosis not present

## 2019-09-12 DIAGNOSIS — Z23 Encounter for immunization: Secondary | ICD-10-CM | POA: Diagnosis not present

## 2019-09-14 DIAGNOSIS — F331 Major depressive disorder, recurrent, moderate: Secondary | ICD-10-CM | POA: Diagnosis not present

## 2019-09-14 DIAGNOSIS — F410 Panic disorder [episodic paroxysmal anxiety] without agoraphobia: Secondary | ICD-10-CM | POA: Diagnosis not present

## 2019-09-22 DIAGNOSIS — F331 Major depressive disorder, recurrent, moderate: Secondary | ICD-10-CM | POA: Diagnosis not present

## 2019-09-22 DIAGNOSIS — F410 Panic disorder [episodic paroxysmal anxiety] without agoraphobia: Secondary | ICD-10-CM | POA: Diagnosis not present

## 2019-09-29 DIAGNOSIS — F410 Panic disorder [episodic paroxysmal anxiety] without agoraphobia: Secondary | ICD-10-CM | POA: Diagnosis not present

## 2019-09-29 DIAGNOSIS — F331 Major depressive disorder, recurrent, moderate: Secondary | ICD-10-CM | POA: Diagnosis not present

## 2019-10-03 DIAGNOSIS — D500 Iron deficiency anemia secondary to blood loss (chronic): Secondary | ICD-10-CM | POA: Diagnosis not present

## 2019-10-03 DIAGNOSIS — K2210 Ulcer of esophagus without bleeding: Secondary | ICD-10-CM | POA: Diagnosis not present

## 2019-10-03 DIAGNOSIS — R634 Abnormal weight loss: Secondary | ICD-10-CM | POA: Diagnosis not present

## 2019-10-03 DIAGNOSIS — K269 Duodenal ulcer, unspecified as acute or chronic, without hemorrhage or perforation: Secondary | ICD-10-CM | POA: Diagnosis not present

## 2019-10-03 DIAGNOSIS — Z1211 Encounter for screening for malignant neoplasm of colon: Secondary | ICD-10-CM | POA: Diagnosis not present

## 2019-10-06 DIAGNOSIS — F331 Major depressive disorder, recurrent, moderate: Secondary | ICD-10-CM | POA: Diagnosis not present

## 2019-10-06 DIAGNOSIS — F410 Panic disorder [episodic paroxysmal anxiety] without agoraphobia: Secondary | ICD-10-CM | POA: Diagnosis not present

## 2019-10-12 DIAGNOSIS — Z1159 Encounter for screening for other viral diseases: Secondary | ICD-10-CM | POA: Diagnosis not present

## 2019-10-17 DIAGNOSIS — K269 Duodenal ulcer, unspecified as acute or chronic, without hemorrhage or perforation: Secondary | ICD-10-CM | POA: Diagnosis not present

## 2019-10-17 DIAGNOSIS — K5730 Diverticulosis of large intestine without perforation or abscess without bleeding: Secondary | ICD-10-CM | POA: Diagnosis not present

## 2019-10-17 DIAGNOSIS — Z1211 Encounter for screening for malignant neoplasm of colon: Secondary | ICD-10-CM | POA: Diagnosis not present

## 2019-10-17 DIAGNOSIS — D123 Benign neoplasm of transverse colon: Secondary | ICD-10-CM | POA: Diagnosis not present

## 2019-10-17 DIAGNOSIS — K222 Esophageal obstruction: Secondary | ICD-10-CM | POA: Diagnosis not present

## 2019-10-17 DIAGNOSIS — K449 Diaphragmatic hernia without obstruction or gangrene: Secondary | ICD-10-CM | POA: Diagnosis not present

## 2019-10-17 DIAGNOSIS — K648 Other hemorrhoids: Secondary | ICD-10-CM | POA: Diagnosis not present

## 2019-10-19 DIAGNOSIS — D123 Benign neoplasm of transverse colon: Secondary | ICD-10-CM | POA: Diagnosis not present

## 2019-10-20 DIAGNOSIS — F410 Panic disorder [episodic paroxysmal anxiety] without agoraphobia: Secondary | ICD-10-CM | POA: Diagnosis not present

## 2019-10-20 DIAGNOSIS — F331 Major depressive disorder, recurrent, moderate: Secondary | ICD-10-CM | POA: Diagnosis not present

## 2019-10-27 DIAGNOSIS — F410 Panic disorder [episodic paroxysmal anxiety] without agoraphobia: Secondary | ICD-10-CM | POA: Diagnosis not present

## 2019-10-27 DIAGNOSIS — F331 Major depressive disorder, recurrent, moderate: Secondary | ICD-10-CM | POA: Diagnosis not present

## 2019-11-18 HISTORY — PX: UPPER GI ENDOSCOPY: SHX6162

## 2019-12-01 DIAGNOSIS — F331 Major depressive disorder, recurrent, moderate: Secondary | ICD-10-CM | POA: Diagnosis not present

## 2019-12-01 DIAGNOSIS — F410 Panic disorder [episodic paroxysmal anxiety] without agoraphobia: Secondary | ICD-10-CM | POA: Diagnosis not present

## 2019-12-08 DIAGNOSIS — F410 Panic disorder [episodic paroxysmal anxiety] without agoraphobia: Secondary | ICD-10-CM | POA: Diagnosis not present

## 2019-12-08 DIAGNOSIS — F331 Major depressive disorder, recurrent, moderate: Secondary | ICD-10-CM | POA: Diagnosis not present

## 2019-12-15 DIAGNOSIS — F331 Major depressive disorder, recurrent, moderate: Secondary | ICD-10-CM | POA: Diagnosis not present

## 2019-12-22 DIAGNOSIS — F331 Major depressive disorder, recurrent, moderate: Secondary | ICD-10-CM | POA: Diagnosis not present

## 2019-12-29 DIAGNOSIS — F331 Major depressive disorder, recurrent, moderate: Secondary | ICD-10-CM | POA: Diagnosis not present

## 2020-01-12 DIAGNOSIS — F410 Panic disorder [episodic paroxysmal anxiety] without agoraphobia: Secondary | ICD-10-CM | POA: Diagnosis not present

## 2020-01-12 DIAGNOSIS — F331 Major depressive disorder, recurrent, moderate: Secondary | ICD-10-CM | POA: Diagnosis not present

## 2020-01-19 DIAGNOSIS — F410 Panic disorder [episodic paroxysmal anxiety] without agoraphobia: Secondary | ICD-10-CM | POA: Diagnosis not present

## 2020-01-19 DIAGNOSIS — F331 Major depressive disorder, recurrent, moderate: Secondary | ICD-10-CM | POA: Diagnosis not present

## 2020-01-26 DIAGNOSIS — F331 Major depressive disorder, recurrent, moderate: Secondary | ICD-10-CM | POA: Diagnosis not present

## 2020-01-26 DIAGNOSIS — F410 Panic disorder [episodic paroxysmal anxiety] without agoraphobia: Secondary | ICD-10-CM | POA: Diagnosis not present

## 2020-02-02 DIAGNOSIS — F410 Panic disorder [episodic paroxysmal anxiety] without agoraphobia: Secondary | ICD-10-CM | POA: Diagnosis not present

## 2020-02-02 DIAGNOSIS — F331 Major depressive disorder, recurrent, moderate: Secondary | ICD-10-CM | POA: Diagnosis not present

## 2020-02-09 DIAGNOSIS — F331 Major depressive disorder, recurrent, moderate: Secondary | ICD-10-CM | POA: Diagnosis not present

## 2020-02-09 DIAGNOSIS — F410 Panic disorder [episodic paroxysmal anxiety] without agoraphobia: Secondary | ICD-10-CM | POA: Diagnosis not present

## 2020-02-23 DIAGNOSIS — F410 Panic disorder [episodic paroxysmal anxiety] without agoraphobia: Secondary | ICD-10-CM | POA: Diagnosis not present

## 2020-02-23 DIAGNOSIS — F331 Major depressive disorder, recurrent, moderate: Secondary | ICD-10-CM | POA: Diagnosis not present

## 2020-03-01 DIAGNOSIS — F410 Panic disorder [episodic paroxysmal anxiety] without agoraphobia: Secondary | ICD-10-CM | POA: Diagnosis not present

## 2020-03-01 DIAGNOSIS — F331 Major depressive disorder, recurrent, moderate: Secondary | ICD-10-CM | POA: Diagnosis not present

## 2020-03-22 DIAGNOSIS — F410 Panic disorder [episodic paroxysmal anxiety] without agoraphobia: Secondary | ICD-10-CM | POA: Diagnosis not present

## 2020-03-22 DIAGNOSIS — F331 Major depressive disorder, recurrent, moderate: Secondary | ICD-10-CM | POA: Diagnosis not present

## 2020-03-29 DIAGNOSIS — F410 Panic disorder [episodic paroxysmal anxiety] without agoraphobia: Secondary | ICD-10-CM | POA: Diagnosis not present

## 2020-03-29 DIAGNOSIS — F331 Major depressive disorder, recurrent, moderate: Secondary | ICD-10-CM | POA: Diagnosis not present

## 2020-04-05 DIAGNOSIS — F331 Major depressive disorder, recurrent, moderate: Secondary | ICD-10-CM | POA: Diagnosis not present

## 2020-04-05 DIAGNOSIS — F410 Panic disorder [episodic paroxysmal anxiety] without agoraphobia: Secondary | ICD-10-CM | POA: Diagnosis not present

## 2020-04-19 DIAGNOSIS — F331 Major depressive disorder, recurrent, moderate: Secondary | ICD-10-CM | POA: Diagnosis not present

## 2020-04-19 DIAGNOSIS — F410 Panic disorder [episodic paroxysmal anxiety] without agoraphobia: Secondary | ICD-10-CM | POA: Diagnosis not present

## 2020-04-26 DIAGNOSIS — F410 Panic disorder [episodic paroxysmal anxiety] without agoraphobia: Secondary | ICD-10-CM | POA: Diagnosis not present

## 2020-04-26 DIAGNOSIS — F331 Major depressive disorder, recurrent, moderate: Secondary | ICD-10-CM | POA: Diagnosis not present

## 2020-05-03 DIAGNOSIS — F410 Panic disorder [episodic paroxysmal anxiety] without agoraphobia: Secondary | ICD-10-CM | POA: Diagnosis not present

## 2020-05-03 DIAGNOSIS — F331 Major depressive disorder, recurrent, moderate: Secondary | ICD-10-CM | POA: Diagnosis not present

## 2020-05-10 DIAGNOSIS — F331 Major depressive disorder, recurrent, moderate: Secondary | ICD-10-CM | POA: Diagnosis not present

## 2020-05-10 DIAGNOSIS — F410 Panic disorder [episodic paroxysmal anxiety] without agoraphobia: Secondary | ICD-10-CM | POA: Diagnosis not present

## 2020-05-17 DIAGNOSIS — F331 Major depressive disorder, recurrent, moderate: Secondary | ICD-10-CM | POA: Diagnosis not present

## 2020-05-17 DIAGNOSIS — F410 Panic disorder [episodic paroxysmal anxiety] without agoraphobia: Secondary | ICD-10-CM | POA: Diagnosis not present

## 2020-05-24 DIAGNOSIS — F331 Major depressive disorder, recurrent, moderate: Secondary | ICD-10-CM | POA: Diagnosis not present

## 2020-05-24 DIAGNOSIS — F410 Panic disorder [episodic paroxysmal anxiety] without agoraphobia: Secondary | ICD-10-CM | POA: Diagnosis not present

## 2020-05-31 DIAGNOSIS — F331 Major depressive disorder, recurrent, moderate: Secondary | ICD-10-CM | POA: Diagnosis not present

## 2020-06-14 DIAGNOSIS — F331 Major depressive disorder, recurrent, moderate: Secondary | ICD-10-CM | POA: Diagnosis not present

## 2020-06-28 DIAGNOSIS — F331 Major depressive disorder, recurrent, moderate: Secondary | ICD-10-CM | POA: Diagnosis not present

## 2020-07-03 DIAGNOSIS — M5441 Lumbago with sciatica, right side: Secondary | ICD-10-CM | POA: Diagnosis not present

## 2020-07-03 DIAGNOSIS — M545 Low back pain: Secondary | ICD-10-CM | POA: Diagnosis not present

## 2020-07-03 DIAGNOSIS — G8929 Other chronic pain: Secondary | ICD-10-CM | POA: Diagnosis not present

## 2020-07-03 DIAGNOSIS — Z9889 Other specified postprocedural states: Secondary | ICD-10-CM | POA: Diagnosis not present

## 2020-07-04 ENCOUNTER — Other Ambulatory Visit: Payer: Self-pay | Admitting: Nurse Practitioner

## 2020-07-04 DIAGNOSIS — G8929 Other chronic pain: Secondary | ICD-10-CM

## 2020-07-04 DIAGNOSIS — Z9889 Other specified postprocedural states: Secondary | ICD-10-CM

## 2020-07-05 DIAGNOSIS — F410 Panic disorder [episodic paroxysmal anxiety] without agoraphobia: Secondary | ICD-10-CM | POA: Diagnosis not present

## 2020-07-05 DIAGNOSIS — F331 Major depressive disorder, recurrent, moderate: Secondary | ICD-10-CM | POA: Diagnosis not present

## 2020-07-12 DIAGNOSIS — F331 Major depressive disorder, recurrent, moderate: Secondary | ICD-10-CM | POA: Diagnosis not present

## 2020-08-03 DIAGNOSIS — Z23 Encounter for immunization: Secondary | ICD-10-CM | POA: Diagnosis not present

## 2020-08-07 ENCOUNTER — Ambulatory Visit
Admission: RE | Admit: 2020-08-07 | Discharge: 2020-08-07 | Disposition: A | Discharge status: Home / Self Care | Payer: Medicare Other | Source: Ambulatory Visit | Attending: Nurse Practitioner | Admitting: Nurse Practitioner

## 2020-08-07 ENCOUNTER — Other Ambulatory Visit: Payer: Self-pay

## 2020-08-07 DIAGNOSIS — G8929 Other chronic pain: Secondary | ICD-10-CM | POA: Insufficient documentation

## 2020-08-07 DIAGNOSIS — Z9889 Other specified postprocedural states: Secondary | ICD-10-CM

## 2020-08-07 DIAGNOSIS — M4319 Spondylolisthesis, multiple sites in spine: Secondary | ICD-10-CM | POA: Diagnosis not present

## 2020-08-07 DIAGNOSIS — M5441 Lumbago with sciatica, right side: Secondary | ICD-10-CM | POA: Diagnosis not present

## 2020-08-07 DIAGNOSIS — M48061 Spinal stenosis, lumbar region without neurogenic claudication: Secondary | ICD-10-CM | POA: Diagnosis not present

## 2020-08-07 DIAGNOSIS — R531 Weakness: Secondary | ICD-10-CM | POA: Diagnosis not present

## 2020-08-07 DIAGNOSIS — R200 Anesthesia of skin: Secondary | ICD-10-CM | POA: Diagnosis not present

## 2020-08-09 DIAGNOSIS — F331 Major depressive disorder, recurrent, moderate: Secondary | ICD-10-CM | POA: Diagnosis not present

## 2020-08-09 DIAGNOSIS — F410 Panic disorder [episodic paroxysmal anxiety] without agoraphobia: Secondary | ICD-10-CM | POA: Diagnosis not present

## 2020-08-13 DIAGNOSIS — F331 Major depressive disorder, recurrent, moderate: Secondary | ICD-10-CM | POA: Diagnosis not present

## 2020-08-14 DIAGNOSIS — M5416 Radiculopathy, lumbar region: Secondary | ICD-10-CM | POA: Diagnosis not present

## 2020-08-16 DIAGNOSIS — F331 Major depressive disorder, recurrent, moderate: Secondary | ICD-10-CM | POA: Diagnosis not present

## 2020-08-16 DIAGNOSIS — F410 Panic disorder [episodic paroxysmal anxiety] without agoraphobia: Secondary | ICD-10-CM | POA: Diagnosis not present

## 2020-08-23 DIAGNOSIS — F331 Major depressive disorder, recurrent, moderate: Secondary | ICD-10-CM | POA: Diagnosis not present

## 2020-08-23 DIAGNOSIS — F410 Panic disorder [episodic paroxysmal anxiety] without agoraphobia: Secondary | ICD-10-CM | POA: Diagnosis not present

## 2020-08-30 DIAGNOSIS — F331 Major depressive disorder, recurrent, moderate: Secondary | ICD-10-CM | POA: Diagnosis not present

## 2020-08-30 DIAGNOSIS — F410 Panic disorder [episodic paroxysmal anxiety] without agoraphobia: Secondary | ICD-10-CM | POA: Diagnosis not present

## 2020-09-06 DIAGNOSIS — F410 Panic disorder [episodic paroxysmal anxiety] without agoraphobia: Secondary | ICD-10-CM | POA: Diagnosis not present

## 2020-09-06 DIAGNOSIS — F331 Major depressive disorder, recurrent, moderate: Secondary | ICD-10-CM | POA: Diagnosis not present

## 2020-09-10 DIAGNOSIS — Z23 Encounter for immunization: Secondary | ICD-10-CM | POA: Diagnosis not present

## 2020-09-21 DIAGNOSIS — F331 Major depressive disorder, recurrent, moderate: Secondary | ICD-10-CM | POA: Diagnosis not present

## 2020-09-23 ENCOUNTER — Other Ambulatory Visit: Payer: Self-pay | Admitting: Neurosurgery

## 2020-09-23 DIAGNOSIS — M546 Pain in thoracic spine: Secondary | ICD-10-CM

## 2020-09-28 DIAGNOSIS — F331 Major depressive disorder, recurrent, moderate: Secondary | ICD-10-CM | POA: Diagnosis not present

## 2020-09-28 DIAGNOSIS — F410 Panic disorder [episodic paroxysmal anxiety] without agoraphobia: Secondary | ICD-10-CM | POA: Diagnosis not present

## 2020-09-28 IMAGING — MR MRI LUMBAR SPINE WITHOUT CONTRAST
5 series · 31 of 48 positions shown · non-contrast
Comparison: None.

CLINICAL DATA: Low back pain radiating into the right leg with
numbness in the toes of the right foot for 5 weeks.

EXAM:
MRI LUMBAR SPINE WITHOUT CONTRAST
TECHNIQUE: Multiplanar, multisequence MR imaging of the lumbar spine was
performed. No intravenous contrast was administered.

[Series 5: T2 · sagittal · 4.0mm · 0.81mm/px · 6 of 17 slices shown (1 of 2)]
[im 1/17]
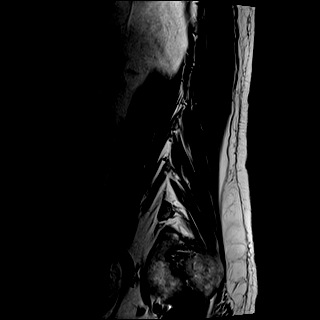
[im 4/17]
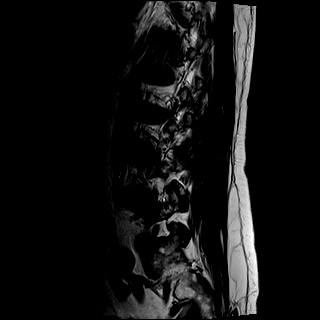
[im 7/17]
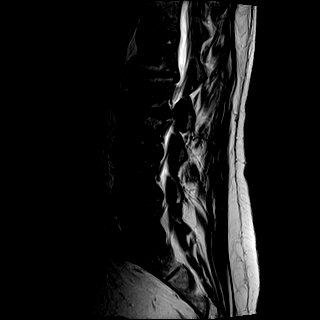
[im 10/17]
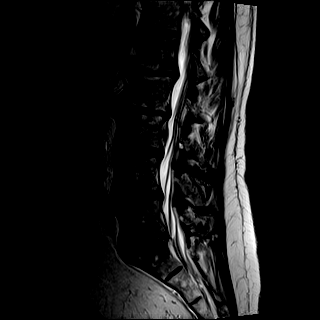
[im 13/17]
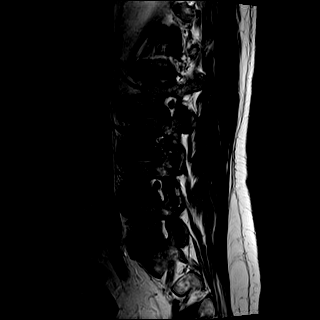
[im 17/17]
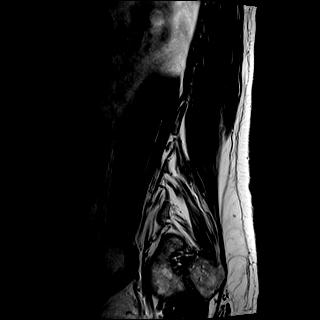

[Series 6: T1 · sagittal · 4.0mm · 0.81mm/px · 7 of 17 slices shown (1 of 2)]
[im 1/17]
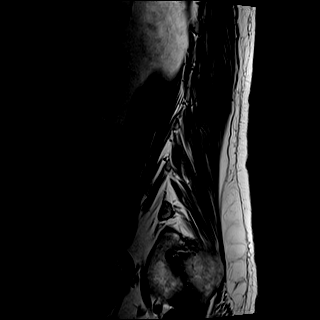
[im 3/17]
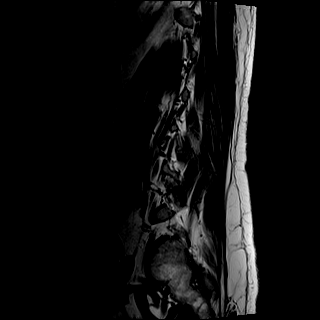
[im 6/17]
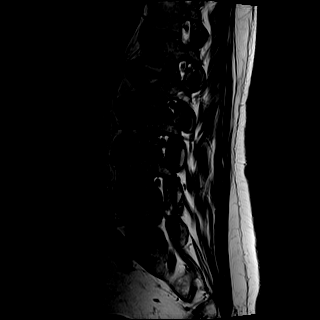
[im 9/17]
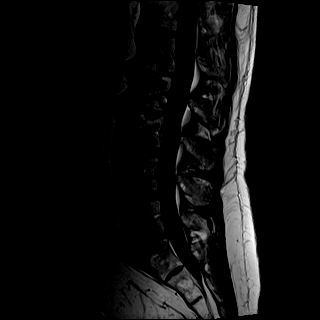
[im 11/17]
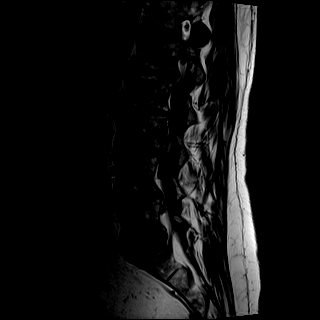
[im 14/17]
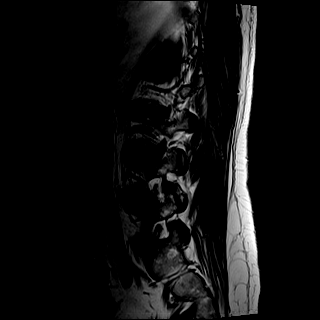
[im 17/17]
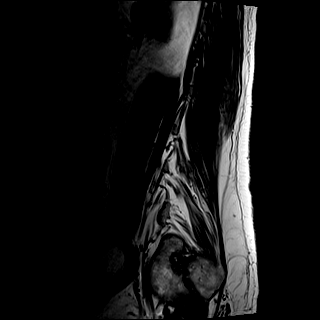

[Series 7: STIR · sagittal · 4.0mm · 0.41mm/px · 2 of 17 slices shown]
[im 1/17]
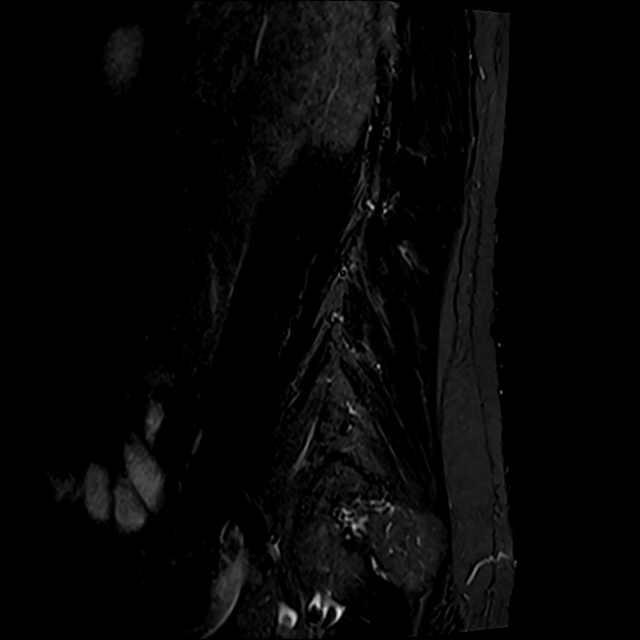
[im 3/17]
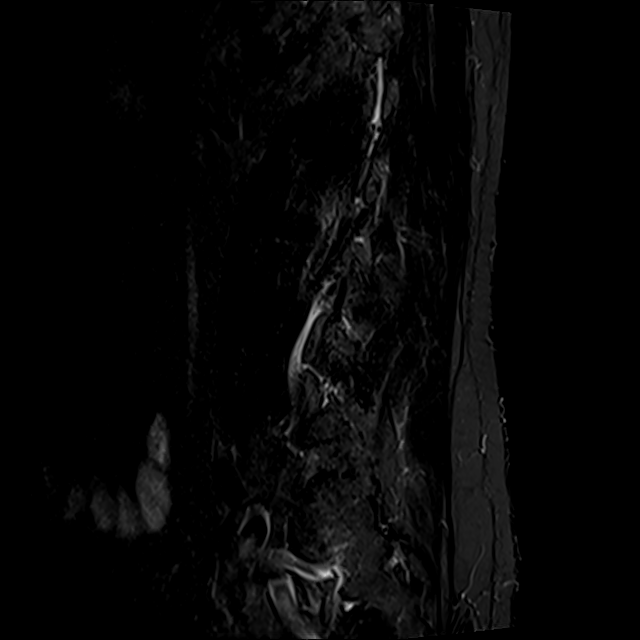

[Series 8: T2 · axial · 4.0mm · 0.78mm/px · z∈[-67,+145]mm · 8 of 36 slices shown (2 of 2)]
[im 1/36]
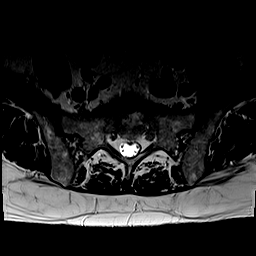
[im 6/36]
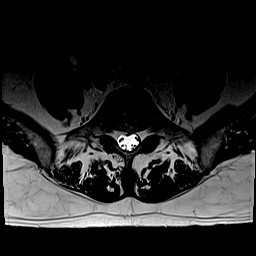
[im 11/36]
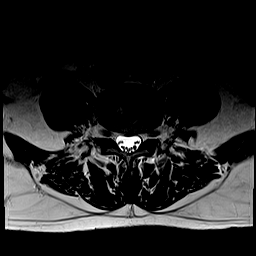
[im 17/36]
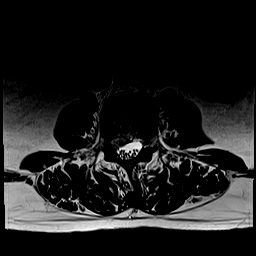
[im 19/36]
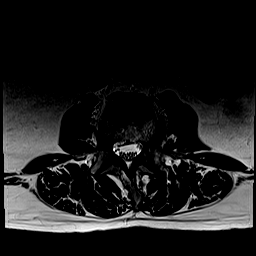
[im 25/36]
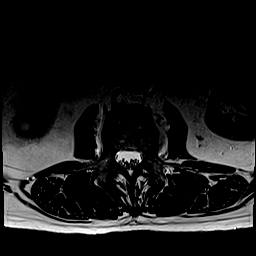
[im 30/36]
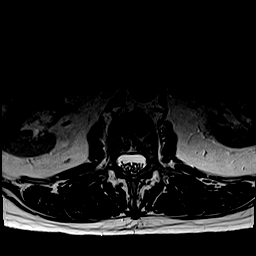
[im 36/36]
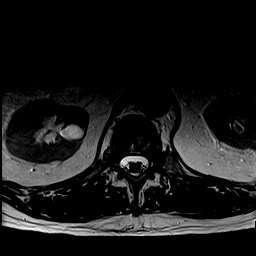

[Series 9: T1 · axial · 4.0mm · 0.39mm/px · z∈[-67,+145]mm · 8 of 36 slices shown (2 of 2)]
[im 1/36]
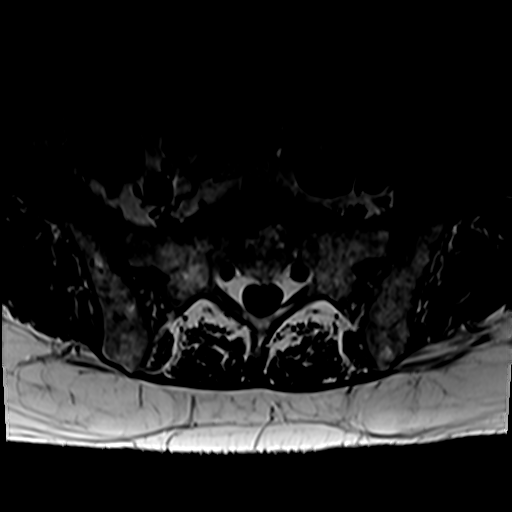
[im 6/36]
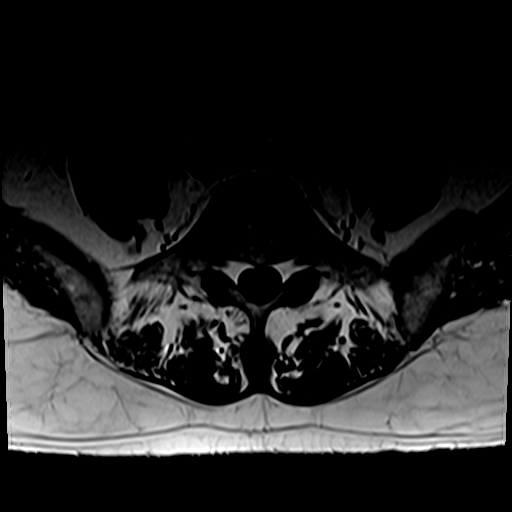
[im 11/36]
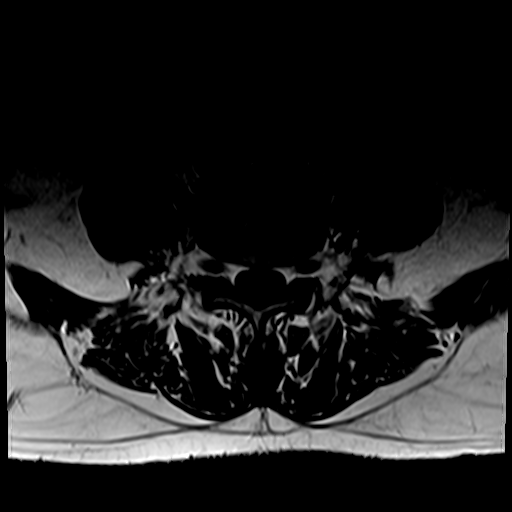
[im 17/36]
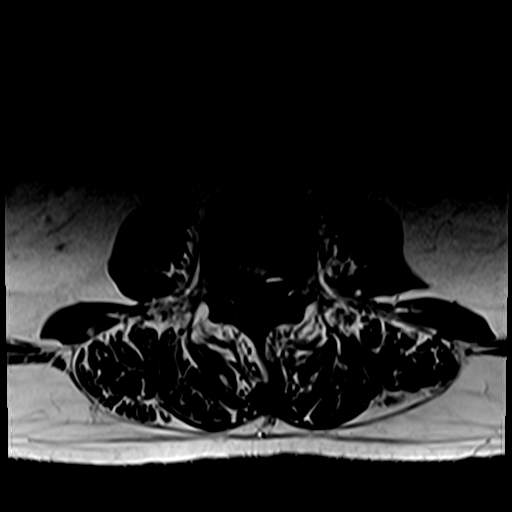
[im 19/36]
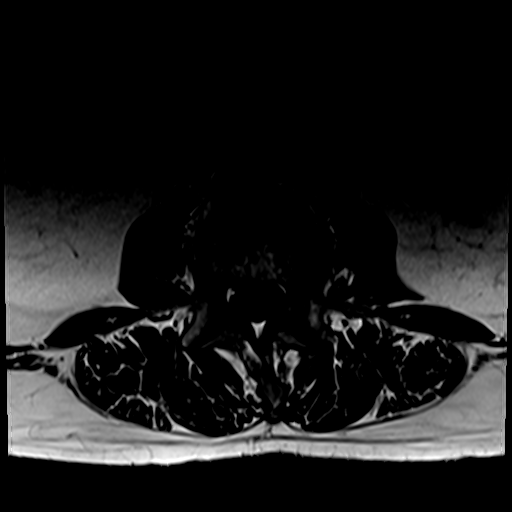
[im 25/36]
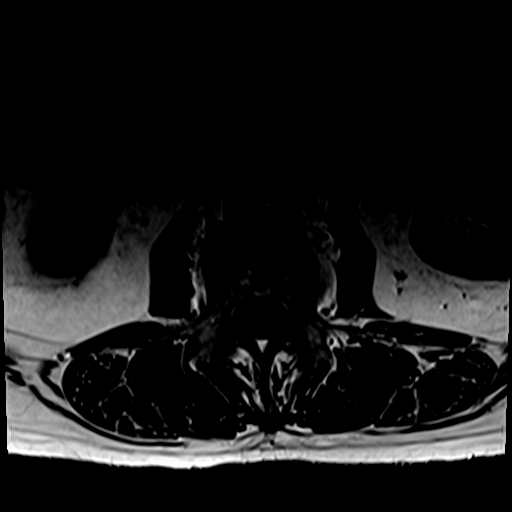
[im 30/36]
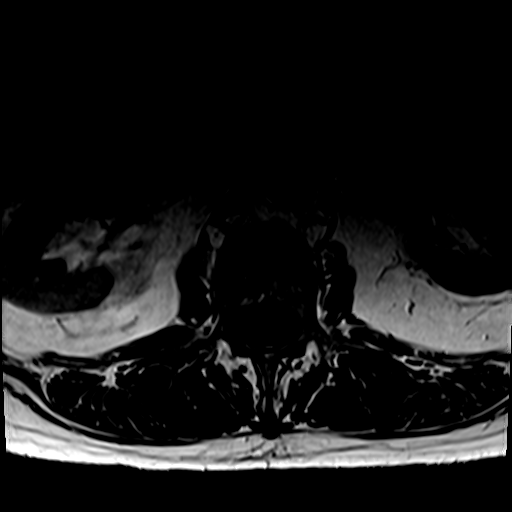
[im 36/36]
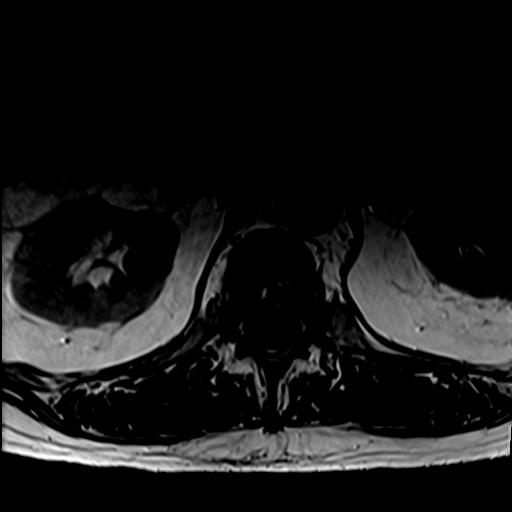

[31 of 48 positions shown; findings below may reference images not displayed]

FINDINGS: Segmentation:  Standard.

Alignment:  Maintained.

Vertebrae: No fracture or worrisome lesion. Degenerative endplate
signal change is most notable at L2-3 where it is eccentric to the
right.

Conus medullaris and cauda equina: Conus extends to the L1 level.
Conus and cauda equina appear normal.

Paraspinal and other soft tissues: 2 small right renal cyst noted.

Disc levels:

T12-L1 is imaged in the sagittal plane only and negative.

L1-2: Loss of disc space height and a shallow bulge without
stenosis.

L2-3: Disc bulge and endplate spur eccentrically prominent to the
left. Mild facet arthropathy. There is mild narrowing in the left
subarticular recess and foramen.

L3-4: An extruded and sequestered disc fragment extends cephalad in
the right lateral recess posterior to L3 and measures 1.2 cm
transverse x 0.7 cm AP x 1.7 cm craniocaudal. The disc impinges on
the descending right L3 root in the lateral recess and right
foramen. There is a shallow disc bulge at the level of the
interspace without central canal stenosis. The left foramen is open.

L4-5: Moderate facet degenerative change and a shallow disc bulge.
There is mild central canal narrowing. Foramina are open.

L5-S1: Shallow central protrusion without stenosis.
IMPRESSION: Large extruded and sequestered disc fragment extending cephalad out
of the L3-4 disc interspace in the right lateral recess posterior to
L3 impinges on the descending and exiting right L3 root. The left
foramen is open at this level.

Mild narrowing in the left subarticular recess and foramen at L2-3
due to disc and endplate spur.

Mild central canal narrowing L4-5.

## 2020-10-04 DIAGNOSIS — F410 Panic disorder [episodic paroxysmal anxiety] without agoraphobia: Secondary | ICD-10-CM | POA: Diagnosis not present

## 2020-10-04 DIAGNOSIS — F331 Major depressive disorder, recurrent, moderate: Secondary | ICD-10-CM | POA: Diagnosis not present

## 2020-10-18 DIAGNOSIS — F410 Panic disorder [episodic paroxysmal anxiety] without agoraphobia: Secondary | ICD-10-CM | POA: Diagnosis not present

## 2020-10-18 DIAGNOSIS — F331 Major depressive disorder, recurrent, moderate: Secondary | ICD-10-CM | POA: Diagnosis not present

## 2020-10-25 DIAGNOSIS — F410 Panic disorder [episodic paroxysmal anxiety] without agoraphobia: Secondary | ICD-10-CM | POA: Diagnosis not present

## 2020-10-25 DIAGNOSIS — F331 Major depressive disorder, recurrent, moderate: Secondary | ICD-10-CM | POA: Diagnosis not present

## 2020-11-01 DIAGNOSIS — F410 Panic disorder [episodic paroxysmal anxiety] without agoraphobia: Secondary | ICD-10-CM | POA: Diagnosis not present

## 2020-11-01 DIAGNOSIS — F331 Major depressive disorder, recurrent, moderate: Secondary | ICD-10-CM | POA: Diagnosis not present

## 2020-11-22 DIAGNOSIS — F331 Major depressive disorder, recurrent, moderate: Secondary | ICD-10-CM | POA: Diagnosis not present

## 2020-11-22 DIAGNOSIS — F410 Panic disorder [episodic paroxysmal anxiety] without agoraphobia: Secondary | ICD-10-CM | POA: Diagnosis not present

## 2020-11-29 DIAGNOSIS — F331 Major depressive disorder, recurrent, moderate: Secondary | ICD-10-CM | POA: Diagnosis not present

## 2020-11-29 DIAGNOSIS — F410 Panic disorder [episodic paroxysmal anxiety] without agoraphobia: Secondary | ICD-10-CM | POA: Diagnosis not present

## 2020-12-06 DIAGNOSIS — F410 Panic disorder [episodic paroxysmal anxiety] without agoraphobia: Secondary | ICD-10-CM | POA: Diagnosis not present

## 2020-12-06 DIAGNOSIS — F331 Major depressive disorder, recurrent, moderate: Secondary | ICD-10-CM | POA: Diagnosis not present

## 2020-12-13 DIAGNOSIS — F410 Panic disorder [episodic paroxysmal anxiety] without agoraphobia: Secondary | ICD-10-CM | POA: Diagnosis not present

## 2020-12-13 DIAGNOSIS — F331 Major depressive disorder, recurrent, moderate: Secondary | ICD-10-CM | POA: Diagnosis not present

## 2020-12-20 DIAGNOSIS — F331 Major depressive disorder, recurrent, moderate: Secondary | ICD-10-CM | POA: Diagnosis not present

## 2020-12-27 DIAGNOSIS — F331 Major depressive disorder, recurrent, moderate: Secondary | ICD-10-CM | POA: Diagnosis not present

## 2020-12-27 DIAGNOSIS — F410 Panic disorder [episodic paroxysmal anxiety] without agoraphobia: Secondary | ICD-10-CM | POA: Diagnosis not present

## 2021-01-03 DIAGNOSIS — F331 Major depressive disorder, recurrent, moderate: Secondary | ICD-10-CM | POA: Diagnosis not present

## 2021-01-03 DIAGNOSIS — F410 Panic disorder [episodic paroxysmal anxiety] without agoraphobia: Secondary | ICD-10-CM | POA: Diagnosis not present

## 2021-01-10 DIAGNOSIS — F331 Major depressive disorder, recurrent, moderate: Secondary | ICD-10-CM | POA: Diagnosis not present

## 2021-01-10 DIAGNOSIS — F410 Panic disorder [episodic paroxysmal anxiety] without agoraphobia: Secondary | ICD-10-CM | POA: Diagnosis not present

## 2021-01-17 DIAGNOSIS — F331 Major depressive disorder, recurrent, moderate: Secondary | ICD-10-CM | POA: Diagnosis not present

## 2021-01-24 DIAGNOSIS — F410 Panic disorder [episodic paroxysmal anxiety] without agoraphobia: Secondary | ICD-10-CM | POA: Diagnosis not present

## 2021-01-24 DIAGNOSIS — F331 Major depressive disorder, recurrent, moderate: Secondary | ICD-10-CM | POA: Diagnosis not present

## 2021-01-31 DIAGNOSIS — F331 Major depressive disorder, recurrent, moderate: Secondary | ICD-10-CM | POA: Diagnosis not present

## 2021-01-31 DIAGNOSIS — F410 Panic disorder [episodic paroxysmal anxiety] without agoraphobia: Secondary | ICD-10-CM | POA: Diagnosis not present

## 2021-02-07 DIAGNOSIS — F410 Panic disorder [episodic paroxysmal anxiety] without agoraphobia: Secondary | ICD-10-CM | POA: Diagnosis not present

## 2021-02-07 DIAGNOSIS — F331 Major depressive disorder, recurrent, moderate: Secondary | ICD-10-CM | POA: Diagnosis not present

## 2021-02-14 DIAGNOSIS — F410 Panic disorder [episodic paroxysmal anxiety] without agoraphobia: Secondary | ICD-10-CM | POA: Diagnosis not present

## 2021-02-14 DIAGNOSIS — F331 Major depressive disorder, recurrent, moderate: Secondary | ICD-10-CM | POA: Diagnosis not present

## 2021-02-21 DIAGNOSIS — F331 Major depressive disorder, recurrent, moderate: Secondary | ICD-10-CM | POA: Diagnosis not present

## 2021-02-21 DIAGNOSIS — F410 Panic disorder [episodic paroxysmal anxiety] without agoraphobia: Secondary | ICD-10-CM | POA: Diagnosis not present

## 2021-02-28 DIAGNOSIS — F410 Panic disorder [episodic paroxysmal anxiety] without agoraphobia: Secondary | ICD-10-CM | POA: Diagnosis not present

## 2021-02-28 DIAGNOSIS — F331 Major depressive disorder, recurrent, moderate: Secondary | ICD-10-CM | POA: Diagnosis not present

## 2021-03-07 DIAGNOSIS — F410 Panic disorder [episodic paroxysmal anxiety] without agoraphobia: Secondary | ICD-10-CM | POA: Diagnosis not present

## 2021-03-07 DIAGNOSIS — F331 Major depressive disorder, recurrent, moderate: Secondary | ICD-10-CM | POA: Diagnosis not present

## 2021-03-14 DIAGNOSIS — F331 Major depressive disorder, recurrent, moderate: Secondary | ICD-10-CM | POA: Diagnosis not present

## 2021-03-14 DIAGNOSIS — F410 Panic disorder [episodic paroxysmal anxiety] without agoraphobia: Secondary | ICD-10-CM | POA: Diagnosis not present

## 2021-03-21 DIAGNOSIS — F410 Panic disorder [episodic paroxysmal anxiety] without agoraphobia: Secondary | ICD-10-CM | POA: Diagnosis not present

## 2021-03-21 DIAGNOSIS — F331 Major depressive disorder, recurrent, moderate: Secondary | ICD-10-CM | POA: Diagnosis not present

## 2021-03-28 DIAGNOSIS — F331 Major depressive disorder, recurrent, moderate: Secondary | ICD-10-CM | POA: Diagnosis not present

## 2021-03-28 DIAGNOSIS — F410 Panic disorder [episodic paroxysmal anxiety] without agoraphobia: Secondary | ICD-10-CM | POA: Diagnosis not present

## 2021-04-04 DIAGNOSIS — F331 Major depressive disorder, recurrent, moderate: Secondary | ICD-10-CM | POA: Diagnosis not present

## 2021-04-04 DIAGNOSIS — F410 Panic disorder [episodic paroxysmal anxiety] without agoraphobia: Secondary | ICD-10-CM | POA: Diagnosis not present

## 2021-04-11 DIAGNOSIS — F410 Panic disorder [episodic paroxysmal anxiety] without agoraphobia: Secondary | ICD-10-CM | POA: Diagnosis not present

## 2021-04-11 DIAGNOSIS — F331 Major depressive disorder, recurrent, moderate: Secondary | ICD-10-CM | POA: Diagnosis not present

## 2021-04-18 DIAGNOSIS — F331 Major depressive disorder, recurrent, moderate: Secondary | ICD-10-CM | POA: Diagnosis not present

## 2021-04-18 DIAGNOSIS — F410 Panic disorder [episodic paroxysmal anxiety] without agoraphobia: Secondary | ICD-10-CM | POA: Diagnosis not present

## 2021-05-02 DIAGNOSIS — F331 Major depressive disorder, recurrent, moderate: Secondary | ICD-10-CM | POA: Diagnosis not present

## 2021-05-02 DIAGNOSIS — F410 Panic disorder [episodic paroxysmal anxiety] without agoraphobia: Secondary | ICD-10-CM | POA: Diagnosis not present

## 2021-05-09 DIAGNOSIS — F331 Major depressive disorder, recurrent, moderate: Secondary | ICD-10-CM | POA: Diagnosis not present

## 2021-05-09 DIAGNOSIS — F410 Panic disorder [episodic paroxysmal anxiety] without agoraphobia: Secondary | ICD-10-CM | POA: Diagnosis not present

## 2021-05-24 DIAGNOSIS — F331 Major depressive disorder, recurrent, moderate: Secondary | ICD-10-CM | POA: Diagnosis not present

## 2021-05-30 DIAGNOSIS — F410 Panic disorder [episodic paroxysmal anxiety] without agoraphobia: Secondary | ICD-10-CM | POA: Diagnosis not present

## 2021-05-30 DIAGNOSIS — F331 Major depressive disorder, recurrent, moderate: Secondary | ICD-10-CM | POA: Diagnosis not present

## 2021-06-06 DIAGNOSIS — F410 Panic disorder [episodic paroxysmal anxiety] without agoraphobia: Secondary | ICD-10-CM | POA: Diagnosis not present

## 2021-06-06 DIAGNOSIS — F331 Major depressive disorder, recurrent, moderate: Secondary | ICD-10-CM | POA: Diagnosis not present

## 2021-06-20 DIAGNOSIS — F410 Panic disorder [episodic paroxysmal anxiety] without agoraphobia: Secondary | ICD-10-CM | POA: Diagnosis not present

## 2021-06-20 DIAGNOSIS — F331 Major depressive disorder, recurrent, moderate: Secondary | ICD-10-CM | POA: Diagnosis not present

## 2021-06-27 DIAGNOSIS — F410 Panic disorder [episodic paroxysmal anxiety] without agoraphobia: Secondary | ICD-10-CM | POA: Diagnosis not present

## 2021-06-27 DIAGNOSIS — F331 Major depressive disorder, recurrent, moderate: Secondary | ICD-10-CM | POA: Diagnosis not present

## 2021-07-04 DIAGNOSIS — F331 Major depressive disorder, recurrent, moderate: Secondary | ICD-10-CM | POA: Diagnosis not present

## 2021-07-04 DIAGNOSIS — F410 Panic disorder [episodic paroxysmal anxiety] without agoraphobia: Secondary | ICD-10-CM | POA: Diagnosis not present

## 2021-07-09 DIAGNOSIS — F331 Major depressive disorder, recurrent, moderate: Secondary | ICD-10-CM | POA: Diagnosis not present

## 2021-07-11 DIAGNOSIS — F331 Major depressive disorder, recurrent, moderate: Secondary | ICD-10-CM | POA: Diagnosis not present

## 2021-07-11 DIAGNOSIS — F410 Panic disorder [episodic paroxysmal anxiety] without agoraphobia: Secondary | ICD-10-CM | POA: Diagnosis not present

## 2021-07-18 DIAGNOSIS — F410 Panic disorder [episodic paroxysmal anxiety] without agoraphobia: Secondary | ICD-10-CM | POA: Diagnosis not present

## 2021-07-18 DIAGNOSIS — F331 Major depressive disorder, recurrent, moderate: Secondary | ICD-10-CM | POA: Diagnosis not present

## 2021-07-25 DIAGNOSIS — F410 Panic disorder [episodic paroxysmal anxiety] without agoraphobia: Secondary | ICD-10-CM | POA: Diagnosis not present

## 2021-07-25 DIAGNOSIS — F331 Major depressive disorder, recurrent, moderate: Secondary | ICD-10-CM | POA: Diagnosis not present

## 2021-08-01 DIAGNOSIS — F410 Panic disorder [episodic paroxysmal anxiety] without agoraphobia: Secondary | ICD-10-CM | POA: Diagnosis not present

## 2021-08-01 DIAGNOSIS — F331 Major depressive disorder, recurrent, moderate: Secondary | ICD-10-CM | POA: Diagnosis not present

## 2021-08-15 DIAGNOSIS — F331 Major depressive disorder, recurrent, moderate: Secondary | ICD-10-CM | POA: Diagnosis not present

## 2021-08-20 DIAGNOSIS — Z23 Encounter for immunization: Secondary | ICD-10-CM | POA: Diagnosis not present

## 2021-08-22 DIAGNOSIS — F331 Major depressive disorder, recurrent, moderate: Secondary | ICD-10-CM | POA: Diagnosis not present

## 2021-08-22 DIAGNOSIS — F410 Panic disorder [episodic paroxysmal anxiety] without agoraphobia: Secondary | ICD-10-CM | POA: Diagnosis not present

## 2021-08-26 DIAGNOSIS — Z23 Encounter for immunization: Secondary | ICD-10-CM | POA: Diagnosis not present

## 2021-08-29 DIAGNOSIS — F410 Panic disorder [episodic paroxysmal anxiety] without agoraphobia: Secondary | ICD-10-CM | POA: Diagnosis not present

## 2021-08-29 DIAGNOSIS — F331 Major depressive disorder, recurrent, moderate: Secondary | ICD-10-CM | POA: Diagnosis not present

## 2021-09-01 ENCOUNTER — Emergency Department (HOSPITAL_COMMUNITY): Payer: Medicare Other

## 2021-09-01 ENCOUNTER — Inpatient Hospital Stay (HOSPITAL_COMMUNITY)
Admission: EM | Admit: 2021-09-01 | Discharge: 2021-09-09 | DRG: 070 | Disposition: A | Discharge status: Skilled Nursing Facility | Payer: Medicare Other | Attending: Internal Medicine | Admitting: Internal Medicine

## 2021-09-01 ENCOUNTER — Encounter (HOSPITAL_COMMUNITY): Payer: Self-pay | Admitting: Emergency Medicine

## 2021-09-01 ENCOUNTER — Other Ambulatory Visit: Payer: Self-pay

## 2021-09-01 DIAGNOSIS — M6281 Muscle weakness (generalized): Secondary | ICD-10-CM | POA: Diagnosis not present

## 2021-09-01 DIAGNOSIS — E512 Wernicke's encephalopathy: Secondary | ICD-10-CM | POA: Diagnosis present

## 2021-09-01 DIAGNOSIS — R531 Weakness: Secondary | ICD-10-CM

## 2021-09-01 DIAGNOSIS — K2211 Ulcer of esophagus with bleeding: Secondary | ICD-10-CM | POA: Diagnosis not present

## 2021-09-01 DIAGNOSIS — Z79899 Other long term (current) drug therapy: Secondary | ICD-10-CM

## 2021-09-01 DIAGNOSIS — K449 Diaphragmatic hernia without obstruction or gangrene: Secondary | ICD-10-CM | POA: Diagnosis not present

## 2021-09-01 DIAGNOSIS — Z87891 Personal history of nicotine dependence: Secondary | ICD-10-CM

## 2021-09-01 DIAGNOSIS — E872 Acidosis, unspecified: Secondary | ICD-10-CM | POA: Diagnosis present

## 2021-09-01 DIAGNOSIS — R410 Disorientation, unspecified: Secondary | ICD-10-CM | POA: Diagnosis not present

## 2021-09-01 DIAGNOSIS — K5713 Diverticulitis of small intestine without perforation or abscess with bleeding: Secondary | ICD-10-CM | POA: Diagnosis not present

## 2021-09-01 DIAGNOSIS — K921 Melena: Secondary | ICD-10-CM | POA: Diagnosis not present

## 2021-09-01 DIAGNOSIS — D500 Iron deficiency anemia secondary to blood loss (chronic): Secondary | ICD-10-CM | POA: Diagnosis not present

## 2021-09-01 DIAGNOSIS — M545 Low back pain, unspecified: Secondary | ICD-10-CM | POA: Diagnosis not present

## 2021-09-01 DIAGNOSIS — K2980 Duodenitis without bleeding: Secondary | ICD-10-CM | POA: Diagnosis not present

## 2021-09-01 DIAGNOSIS — G9340 Encephalopathy, unspecified: Secondary | ICD-10-CM | POA: Diagnosis present

## 2021-09-01 DIAGNOSIS — R4182 Altered mental status, unspecified: Secondary | ICD-10-CM | POA: Diagnosis not present

## 2021-09-01 DIAGNOSIS — R5383 Other fatigue: Secondary | ICD-10-CM | POA: Diagnosis not present

## 2021-09-01 DIAGNOSIS — G9341 Metabolic encephalopathy: Principal | ICD-10-CM | POA: Diagnosis present

## 2021-09-01 DIAGNOSIS — G8929 Other chronic pain: Secondary | ICD-10-CM | POA: Diagnosis not present

## 2021-09-01 DIAGNOSIS — R42 Dizziness and giddiness: Secondary | ICD-10-CM | POA: Diagnosis not present

## 2021-09-01 DIAGNOSIS — Z885 Allergy status to narcotic agent status: Secondary | ICD-10-CM

## 2021-09-01 DIAGNOSIS — G319 Degenerative disease of nervous system, unspecified: Secondary | ICD-10-CM | POA: Diagnosis not present

## 2021-09-01 DIAGNOSIS — K7689 Other specified diseases of liver: Secondary | ICD-10-CM | POA: Diagnosis not present

## 2021-09-01 DIAGNOSIS — E440 Moderate protein-calorie malnutrition: Secondary | ICD-10-CM | POA: Insufficient documentation

## 2021-09-01 DIAGNOSIS — Z6825 Body mass index (BMI) 25.0-25.9, adult: Secondary | ICD-10-CM

## 2021-09-01 DIAGNOSIS — N400 Enlarged prostate without lower urinary tract symptoms: Secondary | ICD-10-CM | POA: Diagnosis not present

## 2021-09-01 DIAGNOSIS — R000 Tachycardia, unspecified: Secondary | ICD-10-CM | POA: Diagnosis not present

## 2021-09-01 DIAGNOSIS — K920 Hematemesis: Secondary | ICD-10-CM | POA: Diagnosis not present

## 2021-09-01 DIAGNOSIS — E860 Dehydration: Principal | ICD-10-CM | POA: Diagnosis present

## 2021-09-01 DIAGNOSIS — Z20822 Contact with and (suspected) exposure to covid-19: Secondary | ICD-10-CM | POA: Diagnosis not present

## 2021-09-01 DIAGNOSIS — R1312 Dysphagia, oropharyngeal phase: Secondary | ICD-10-CM | POA: Diagnosis not present

## 2021-09-01 DIAGNOSIS — F32A Depression, unspecified: Secondary | ICD-10-CM | POA: Diagnosis not present

## 2021-09-01 DIAGNOSIS — R404 Transient alteration of awareness: Secondary | ICD-10-CM | POA: Diagnosis not present

## 2021-09-01 DIAGNOSIS — R0902 Hypoxemia: Secondary | ICD-10-CM | POA: Diagnosis not present

## 2021-09-01 DIAGNOSIS — K208 Other esophagitis without bleeding: Secondary | ICD-10-CM | POA: Diagnosis not present

## 2021-09-01 DIAGNOSIS — K922 Gastrointestinal hemorrhage, unspecified: Secondary | ICD-10-CM | POA: Diagnosis not present

## 2021-09-01 DIAGNOSIS — K2210 Ulcer of esophagus without bleeding: Secondary | ICD-10-CM | POA: Diagnosis not present

## 2021-09-01 DIAGNOSIS — K5712 Diverticulitis of small intestine without perforation or abscess without bleeding: Secondary | ICD-10-CM | POA: Diagnosis not present

## 2021-09-01 DIAGNOSIS — R41841 Cognitive communication deficit: Secondary | ICD-10-CM | POA: Diagnosis not present

## 2021-09-01 DIAGNOSIS — Z7401 Bed confinement status: Secondary | ICD-10-CM | POA: Diagnosis not present

## 2021-09-01 DIAGNOSIS — S0990XA Unspecified injury of head, initial encounter: Secondary | ICD-10-CM | POA: Diagnosis not present

## 2021-09-01 DIAGNOSIS — E876 Hypokalemia: Secondary | ICD-10-CM | POA: Diagnosis not present

## 2021-09-01 DIAGNOSIS — R2681 Unsteadiness on feet: Secondary | ICD-10-CM | POA: Diagnosis not present

## 2021-09-01 DIAGNOSIS — R5381 Other malaise: Secondary | ICD-10-CM | POA: Diagnosis not present

## 2021-09-01 DIAGNOSIS — M549 Dorsalgia, unspecified: Secondary | ICD-10-CM | POA: Diagnosis not present

## 2021-09-01 DIAGNOSIS — D649 Anemia, unspecified: Secondary | ICD-10-CM | POA: Diagnosis not present

## 2021-09-01 LAB — COMPREHENSIVE METABOLIC PANEL
ALT: 23 U/L (ref 0–44)
AST: 29 U/L (ref 15–41)
Albumin: 4.3 g/dL (ref 3.5–5.0)
Alkaline Phosphatase: 64 U/L (ref 38–126)
Anion gap: 17 — ABNORMAL HIGH (ref 5–15)
BUN: 39 mg/dL — ABNORMAL HIGH (ref 8–23)
CO2: 15 mmol/L — ABNORMAL LOW (ref 22–32)
Calcium: 9.7 mg/dL (ref 8.9–10.3)
Chloride: 107 mmol/L (ref 98–111)
Creatinine, Ser: 1.2 mg/dL (ref 0.61–1.24)
GFR, Estimated: >60 mL/min (ref >60)
Glucose, Bld: 94 mg/dL (ref 70–99)
Potassium: 3.8 mmol/L (ref 3.5–5.1)
Sodium: 139 mmol/L (ref 135–145)
Total Bilirubin: 1.9 mg/dL — ABNORMAL HIGH (ref 0.3–1.2)
Total Protein: 7.5 g/dL (ref 6.5–8.1)

## 2021-09-01 LAB — CBC WITH DIFFERENTIAL/PLATELET
Abs Immature Granulocytes: 0.07 10*3/uL (ref 0.00–0.07)
Basophils Absolute: 0 10*3/uL (ref 0.0–0.1)
Basophils Relative: 0 %
Eosinophils Absolute: 0 10*3/uL (ref 0.0–0.5)
Eosinophils Relative: 0 %
HCT: 50.6 % (ref 39.0–52.0)
Hemoglobin: 16.4 g/dL (ref 13.0–17.0)
Immature Granulocytes: 1 %
Lymphocytes Relative: 7 %
Lymphs Abs: 0.9 10*3/uL (ref 0.7–4.0)
MCH: 29.4 pg (ref 26.0–34.0)
MCHC: 32.4 g/dL (ref 30.0–36.0)
MCV: 90.8 fL (ref 80.0–100.0)
Monocytes Absolute: 1.2 10*3/uL — ABNORMAL HIGH (ref 0.1–1.0)
Monocytes Relative: 9 %
Neutro Abs: 11 10*3/uL — ABNORMAL HIGH (ref 1.7–7.7)
Neutrophils Relative %: 83 %
Platelets: 218 10*3/uL (ref 150–400)
RBC: 5.57 MIL/uL (ref 4.22–5.81)
RDW: 15.9 % — ABNORMAL HIGH (ref 11.5–15.5)
WBC: 13.2 10*3/uL — ABNORMAL HIGH (ref 4.0–10.5)
nRBC: 0 % (ref 0.0–0.2)

## 2021-09-01 LAB — URINALYSIS, ROUTINE W REFLEX MICROSCOPIC
Bacteria, UA: NONE SEEN
Bilirubin Urine: NEGATIVE
Glucose, UA: NEGATIVE mg/dL
Ketones, ur: 80 mg/dL — AB
Leukocytes,Ua: NEGATIVE
Nitrite: NEGATIVE
Protein, ur: 100 mg/dL — AB
Specific Gravity, Urine: 1.021 (ref 1.005–1.030)
pH: 6 (ref 5.0–8.0)

## 2021-09-01 LAB — CBG MONITORING, ED: Glucose-Capillary: 105 mg/dL — ABNORMAL HIGH (ref 70–99)

## 2021-09-01 MED ORDER — LACTATED RINGERS IV BOLUS
1000.0000 mL | Freq: Once | INTRAVENOUS | Status: AC
  Administered 2021-09-02: 1000 mL via INTRAVENOUS

## 2021-09-01 MED ORDER — SODIUM CHLORIDE 0.9 % IV BOLUS
1000.0000 mL | Freq: Once | INTRAVENOUS | Status: AC
  Administered 2021-09-01: 1000 mL via INTRAVENOUS

## 2021-09-01 NOTE — ED Triage Notes (Signed)
BIB EMS from home due to family reporting that pt is altered and rolled out of his bed onto the floor today. Pt denies injury on arrival but reported left flank/rib pain to EMS. Received flu shot on Tues 10/11 and has had little to no PO intake since Fri 10/14. Family also reports dark, foul-smelling urine. GCS 14 (disoriented to time), no obvious injuries noted. EMS notes that capnography was 20 en route.

## 2021-09-01 NOTE — ED Notes (Signed)
Patient transported to CT 

## 2021-09-01 NOTE — ED Provider Notes (Signed)
[facility] DEPT Provider Note   CSN: [id number] Arrival date & time: 09/01/21  1557     History Chief Complaint  Patient presents with   Altered Mental Status   Fall    Jose Bender is a 75 y.o. male.  Patient with no pertinent past medical history presents today with complaint of altered mental status. According to patient, he got a flu shot on Tuesday and proceeded to have several episodes of vomiting Wednesday night and into Thursday. Is unsure of events between Thursday and now. A friend who is present today states that he did not hear from the patient between Thursday and today which is unusual, went to check on him today. When he arrived at the patient's house, the friend saw the patient knocking on his bedroom window, heard a thud, opened the door and found the patient lying on the ground. Patient endorses some decreased po intake over the past few days with darker urine. He also states that he has been much weaker over the past few days with associated difficulty ambulating. Denies fevers, chills, headaches, shortness of breath, chest pain, abdominal pain, dysuria, hematuria  The history is provided by the patient. No language interpreter was used.  Altered Mental Status Presenting symptoms: no confusion   Associated symptoms: vomiting   Associated symptoms: no abdominal pain, no fever, no headaches, no light-headedness, no nausea, no palpitations, no seizures and no weakness   Fall Pertinent negatives include no chest pain, no abdominal pain, no headaches and no shortness of breath.      Past Medical History:  Diagnosis Date   Arthritis    Depression     Patient Active Problem List   Diagnosis Date Noted   Acute upper GI bleed 08/11/2019   Upper GI bleeding 08/10/2019   Diverticulitis of duodenum 08/10/2019   Chronic low back pain 08/10/2019   Hypokalemia 08/10/2019   Normocytic anemia 08/10/2019    Past Surgical History:   Procedure Laterality Date   BACK SURGERY     BIOPSY  08/12/2019   Procedure: BIOPSY;  Surgeon: Otis Brace, MD;  Location: [facility];  Service: Gastroenterology;;   ESOPHAGOGASTRODUODENOSCOPY (EGD) WITH PROPOFOL N/A 08/12/2019   Procedure: ESOPHAGOGASTRODUODENOSCOPY (EGD) WITH PROPOFOL;  Surgeon: Otis Brace, MD;  Location: [facility];  Service: Gastroenterology;  Laterality: N/A;   EYE SURGERY Bilateral    cataracts   KNEE ARTHROSCOPY Left    LUMBAR LAMINECTOMY/DECOMPRESSION MICRODISCECTOMY N/A 06/27/2019   Procedure: L3-4 HEMILAMINECTOMY DISCECTOMY;  Surgeon: Deetta Perla, MD;  Location: [facility];  Service: Neurosurgery;  Laterality: N/A;       History reviewed. No pertinent family history.  Social History   Tobacco Use   Smoking status: Former    Types: Cigarettes    Quit date: 06/22/2004    Years since quitting: 17.2   Smokeless tobacco: Never  Vaping Use   Vaping Use: Never used  Substance Use Topics   Alcohol use: Yes    Comment: every few days   Drug use: Never    Home Medications Prior to Admission medications   Medication Sig Start Date End Date Taking? Authorizing Provider  acetaminophen (TYLENOL) 500 MG tablet Take 500 mg by mouth every 8 (eight) hours as needed (pain).     [provider]  baclofen (LIORESAL) 10 MG tablet Take 5 mg by mouth 2 (two) times daily.    [provider]  folic acid (FOLVITE) 1 MG tablet Take 1 tablet (1 mg total) by mouth  daily. 08/13/19   Aline August, MD  gabapentin (NEURONTIN) 300 MG capsule Take 300 mg by mouth 3 (three) times daily.    [provider]  Multiple Vitamin (MULTIVITAMIN WITH MINERALS) TABS tablet Take 1 tablet by mouth daily. 08/13/19   Aline August, MD  ondansetron (ZOFRAN) 4 MG tablet Take 1 tablet (4 mg total) by mouth every 6 (six) hours as needed for nausea. 08/13/19   Aline August, MD  pantoprazole (PROTONIX) 40 MG tablet Take 1 tablet (40 mg total) by mouth 2 (two)  times daily before a meal. 08/13/19 09/12/19  Aline August, MD  sertraline (ZOLOFT) 100 MG tablet Take 150 mg by mouth daily.    [provider]  thiamine 100 MG tablet Take 1 tablet (100 mg total) by mouth daily. 08/13/19   Aline August, MD    Allergies    Codeine  Review of Systems   Review of Systems  Constitutional:  Negative for chills, fatigue and fever.  HENT:  Negative for congestion, postnasal drip, rhinorrhea and sore throat.   Respiratory:  Negative for cough and shortness of breath.   Cardiovascular:  Negative for chest pain, palpitations and leg swelling.  Gastrointestinal:  Positive for vomiting. Negative for abdominal distention, abdominal pain, blood in stool, constipation, diarrhea and nausea.  Genitourinary:  Positive for decreased urine volume.  Neurological:  Negative for dizziness, tremors, seizures, syncope, facial asymmetry, speech difficulty, weakness, light-headedness, numbness and headaches.  Psychiatric/Behavioral:  Negative for confusion and decreased concentration.   All other systems reviewed and are negative.  Physical Exam Updated Vital Signs BP 134/77 (BP Location: Right Arm)   Pulse 79   Temp 97.9 F (36.6 C) (Oral)   Resp 12   Ht 5\' 9"  (1.753 m)   Wt 81.6 kg   SpO2 100%   BMI 26.58 kg/m   Physical Exam Vitals and nursing note reviewed.  Constitutional:      General: He is not in acute distress.    Appearance: Normal appearance. He is normal weight. He is not ill-appearing, toxic-appearing or diaphoretic.  HENT:     Head: Normocephalic and atraumatic.     Nose: Nose normal.     Mouth/Throat:     Mouth: Mucous membranes are dry.  Eyes:     Extraocular Movements: Extraocular movements intact.     Pupils: Pupils are equal, round, and reactive to light.  Cardiovascular:     Rate and Rhythm: Normal rate and regular rhythm.     Heart sounds: Normal heart sounds.  Pulmonary:     Effort: Pulmonary effort is normal. No respiratory  distress.     Breath sounds: Normal breath sounds.  Abdominal:     General: Abdomen is flat.     Palpations: Abdomen is soft.     Tenderness: There is no right CVA tenderness or left CVA tenderness.  Musculoskeletal:        General: Normal range of motion.     Cervical back: Normal range of motion.  Skin:    General: Skin is warm and dry.  Neurological:     General: No focal deficit present.     Mental Status: He is alert.     GCS: GCS eye subscore is 4. GCS verbal subscore is 5. GCS motor subscore is 6.     Cranial Nerves: Cranial nerves are intact.     Sensory: Sensation is intact.     Motor: Motor function is intact.     Coordination: Coordination is intact.  Gait: Gait is intact.     Comments: Alert and oriented to self, place, and event. Unsure of time, states that it is 1991.   Speech is fluent, clear without dysarthria or dysphasia.    Strength 5/5 in upper/lower extremities   Sensation intact in upper/lower extremities    CN I not tested  CN II grossly intact visual fields bilaterally. Did not visualize posterior eye.  CN III, IV, VI PERRLA and EOMs intact bilaterally  CN V Intact sensation to sharp and light touch to the face  CN VII facial movements symmetric  CN VIII not tested  CN IX, X no uvula deviation, symmetric rise of soft palate  CN XI 5/5 SCM and trapezius strength bilaterally  CN XII Midline tongue protrusion, symmetric L/R movements   Psychiatric:        Mood and Affect: Mood normal.        Behavior: Behavior normal.    ED Results / Procedures / Treatments   Labs (all labs ordered are listed, but only abnormal results are displayed) Labs Reviewed  CBC WITH DIFFERENTIAL/PLATELET - Abnormal; Notable for the following components:      Result Value   WBC 13.2 (*)    RDW 15.9 (*)    Neutro Abs 11.0 (*)    Monocytes Absolute 1.2 (*)    All other components within normal limits  COMPREHENSIVE METABOLIC PANEL - Abnormal; Notable for the following  components:   CO2 15 (*)    BUN 39 (*)    Total Bilirubin 1.9 (*)    Anion gap 17 (*)    All other components within normal limits  CBG MONITORING, ED - Abnormal; Notable for the following components:   Glucose-Capillary 105 (*)    All other components within normal limits  URINALYSIS, ROUTINE W REFLEX MICROSCOPIC    EKG None  Radiology DG Chest 2 View  Result Date: 09/01/2021 CLINICAL DATA:  Altered mental status EXAM: CHEST - 2 VIEW COMPARISON:  None. FINDINGS: Heart and mediastinal contours are within normal limits. No focal opacities or effusions. No acute bony abnormality. IMPRESSION: No active cardiopulmonary disease. Electronically Signed   By: Rolm Baptise M.D.   On: 09/01/2021 17:26   CT Head Wo Contrast  Result Date: 09/01/2021 CLINICAL DATA:  Head trauma, minor (Age >= 65y) EXAM: CT HEAD WITHOUT CONTRAST TECHNIQUE: Contiguous axial images were obtained from the base of the skull through the vertex without intravenous contrast. COMPARISON:  None. FINDINGS: Brain: There is no acute intracranial hemorrhage, mass effect, or edema. Gray-white differentiation is preserved. There is no extra-axial fluid collection. Prominence of the ventricles and sulci reflects mild parenchymal volume loss. Patchy and confluent hypoattenuation in the supratentorial white matter is nonspecific but probably reflects moderate chronic microvascular ischemic changes. Vascular: There is atherosclerotic calcification at the skull base. Skull: Calvarium is unremarkable. Sinuses/Orbits: No acute finding. Other: None. IMPRESSION: No evidence of acute intracranial injury. Electronically Signed   By: Macy Mis M.D.   On: 09/01/2021 18:03    Procedures Procedures   Medications Ordered in ED Medications  sodium chloride 0.9 % bolus 1,000 mL (0 mLs Intravenous Stopped 09/01/21 2239)    ED Course  I have reviewed the triage vital signs and the nursing notes.  Pertinent labs & imaging results that were  available during my care of the patient were reviewed by me and considered in my medical decision making (see chart for details).    MDM Rules/Calculators/A&P  Patient presents today with altered mental status. On exam he is speaking in complete sentences and is overall neurologically intact with his only deficit being inability to state the time correctly. His friend who is present states this is unlike him. Patient also endorses several episodes of vomiting and decreased oral intake. He also endorses significant weakness over the past few days and has been unable to ambulate throughout his house given significant weakness.   Chest X-ray and CT head unremarkable for acute abnormalities, EKG is unchanged from baseline. Patient denies headache, dizziness, chest pain, or shortness of breath. Doubt neurologic or cardiac cause of symptoms at this time.   He has dry mucus membranes, BUN of 39 up from 9 two years ago, t bili of 1.9, AG of 17. UA reveals ketonuria, proteinuria, and moderate hematuria. 2 L of fluid given, however feel that given weakness and significant dehydration after several days of symptoms, patient requires admission for further rehydration, observation, and reevaluation. Spoke with patient who is amenable with admission plan.  Spoke with hospitalist Dr. Louanne Skye who agrees to admit   This is a shared visit with supervising physician Dr. Johnney Killian who has independently evaluated patient & provided guidance in evaluation/management/disposition, in agreement with care    Final Clinical Impression(s) / ED Diagnoses Final diagnoses:  Dehydration    Rx / DC Orders ED Discharge Orders     None        Nestor Lewandowsky 09/02/21 0216    Charlesetta Shanks, MD 09/04/21 1706

## 2021-09-02 ENCOUNTER — Encounter (HOSPITAL_COMMUNITY): Payer: Self-pay | Admitting: Internal Medicine

## 2021-09-02 ENCOUNTER — Observation Stay (HOSPITAL_COMMUNITY): Payer: Medicare Other

## 2021-09-02 DIAGNOSIS — R410 Disorientation, unspecified: Secondary | ICD-10-CM | POA: Diagnosis not present

## 2021-09-02 DIAGNOSIS — N400 Enlarged prostate without lower urinary tract symptoms: Secondary | ICD-10-CM | POA: Diagnosis not present

## 2021-09-02 DIAGNOSIS — R531 Weakness: Secondary | ICD-10-CM

## 2021-09-02 DIAGNOSIS — K7689 Other specified diseases of liver: Secondary | ICD-10-CM | POA: Diagnosis not present

## 2021-09-02 DIAGNOSIS — R4182 Altered mental status, unspecified: Secondary | ICD-10-CM | POA: Diagnosis not present

## 2021-09-02 DIAGNOSIS — M549 Dorsalgia, unspecified: Secondary | ICD-10-CM | POA: Diagnosis not present

## 2021-09-02 DIAGNOSIS — K449 Diaphragmatic hernia without obstruction or gangrene: Secondary | ICD-10-CM | POA: Diagnosis not present

## 2021-09-02 DIAGNOSIS — G319 Degenerative disease of nervous system, unspecified: Secondary | ICD-10-CM | POA: Diagnosis not present

## 2021-09-02 DIAGNOSIS — G9340 Encephalopathy, unspecified: Secondary | ICD-10-CM | POA: Diagnosis not present

## 2021-09-02 LAB — CBC WITH DIFFERENTIAL/PLATELET
Abs Immature Granulocytes: 0.05 10*3/uL (ref 0.00–0.07)
Basophils Absolute: 0 10*3/uL (ref 0.0–0.1)
Basophils Relative: 0 %
Eosinophils Absolute: 0 10*3/uL (ref 0.0–0.5)
Eosinophils Relative: 0 %
HCT: 49.2 % (ref 39.0–52.0)
Hemoglobin: 15.9 g/dL (ref 13.0–17.0)
Immature Granulocytes: 1 %
Lymphocytes Relative: 13 %
Lymphs Abs: 1.3 10*3/uL (ref 0.7–4.0)
MCH: 29.7 pg (ref 26.0–34.0)
MCHC: 32.3 g/dL (ref 30.0–36.0)
MCV: 91.8 fL (ref 80.0–100.0)
Monocytes Absolute: 1 10*3/uL (ref 0.1–1.0)
Monocytes Relative: 10 %
Neutro Abs: 7.6 10*3/uL (ref 1.7–7.7)
Neutrophils Relative %: 76 %
Platelets: 185 10*3/uL (ref 150–400)
RBC: 5.36 MIL/uL (ref 4.22–5.81)
RDW: 15.9 % — ABNORMAL HIGH (ref 11.5–15.5)
WBC: 10 10*3/uL (ref 4.0–10.5)
nRBC: 0 % (ref 0.0–0.2)

## 2021-09-02 LAB — TSH
TSH: 0.494 u[IU]/mL (ref 0.350–4.500)
TSH: 0.432 u[IU]/mL (ref 0.350–4.500)

## 2021-09-02 LAB — HEPATIC FUNCTION PANEL
ALT: 20 U/L (ref 0–44)
AST: 25 U/L (ref 15–41)
Albumin: 4.1 g/dL (ref 3.5–5.0)
Alkaline Phosphatase: 61 U/L (ref 38–126)
Bilirubin, Direct: 0.1 mg/dL (ref 0.0–0.2)
Indirect Bilirubin: 1.6 mg/dL — ABNORMAL HIGH (ref 0.3–0.9)
Total Bilirubin: 1.7 mg/dL — ABNORMAL HIGH (ref 0.3–1.2)
Total Protein: 7.1 g/dL (ref 6.5–8.1)

## 2021-09-02 LAB — LACTIC ACID, PLASMA: Lactic Acid, Venous: 0.7 mmol/L (ref 0.5–1.9)

## 2021-09-02 LAB — BASIC METABOLIC PANEL
Anion gap: 14 (ref 5–15)
BUN: 33 mg/dL — ABNORMAL HIGH (ref 8–23)
CO2: 18 mmol/L — ABNORMAL LOW (ref 22–32)
Calcium: 9.4 mg/dL (ref 8.9–10.3)
Chloride: 105 mmol/L (ref 98–111)
Creatinine, Ser: 1.11 mg/dL (ref 0.61–1.24)
GFR, Estimated: >60 mL/min (ref >60)
Glucose, Bld: 84 mg/dL (ref 70–99)
Potassium: 3.6 mmol/L (ref 3.5–5.1)
Sodium: 137 mmol/L (ref 135–145)

## 2021-09-02 LAB — BLOOD GAS, VENOUS
Acid-base deficit: 8.4 mmol/L — ABNORMAL HIGH (ref 0.0–2.0)
Bicarbonate: 15.7 mmol/L — ABNORMAL LOW (ref 20.0–28.0)
O2 Saturation: 67 %
Patient temperature: 98.6
pCO2, Ven: 29.9 mmHg — ABNORMAL LOW (ref 44.0–60.0)
pH, Ven: 7.339 (ref 7.250–7.430)
pO2, Ven: 35.6 mmHg (ref 32.0–45.0)

## 2021-09-02 LAB — MAGNESIUM: Magnesium: 2.6 mg/dL — ABNORMAL HIGH (ref 1.7–2.4)

## 2021-09-02 LAB — RESP PANEL BY RT-PCR (FLU A&B, COVID) ARPGX2
Influenza A by PCR: NEGATIVE
Influenza B by PCR: NEGATIVE
SARS Coronavirus 2 by RT PCR: NEGATIVE

## 2021-09-02 LAB — SALICYLATE LEVEL: Salicylate Lvl: <7 mg/dL — ABNORMAL LOW (ref 7.0–30.0)

## 2021-09-02 LAB — RAPID URINE DRUG SCREEN, HOSP PERFORMED
Amphetamines: NOT DETECTED
Barbiturates: NOT DETECTED
Benzodiazepines: NOT DETECTED
Cocaine: NOT DETECTED
Opiates: NOT DETECTED
Tetrahydrocannabinol: NOT DETECTED

## 2021-09-02 LAB — OCCULT BLOOD X 1 CARD TO LAB, STOOL: Fecal Occult Bld: POSITIVE — AB

## 2021-09-02 LAB — VITAMIN B12: Vitamin B-12: 572 pg/mL (ref 180–914)

## 2021-09-02 LAB — ACETAMINOPHEN LEVEL: Acetaminophen (Tylenol), Serum: <10 ug/mL — ABNORMAL LOW (ref 10–30)

## 2021-09-02 LAB — AMMONIA: Ammonia: 28 umol/L (ref 9–35)

## 2021-09-02 LAB — RPR: RPR Ser Ql: NONREACTIVE

## 2021-09-02 LAB — PHOSPHORUS: Phosphorus: 2 mg/dL — ABNORMAL LOW (ref 2.5–4.6)

## 2021-09-02 LAB — CK: Total CK: 165 U/L (ref 49–397)

## 2021-09-02 MED ORDER — LACTATED RINGERS IV SOLN: INTRAVENOUS | Status: AC

## 2021-09-02 MED ORDER — ENSURE ENLIVE PO LIQD
237.0000 mL | Freq: Two times a day (BID) | ORAL | Status: DC
  Administered 2021-09-02 – 2021-09-09 (×13): 237 mL via ORAL

## 2021-09-02 MED ORDER — POTASSIUM PHOSPHATES 15 MMOLE/5ML IV SOLN
15.0000 mmol | Freq: Once | INTRAVENOUS | Status: AC
  Administered 2021-09-02: 15 mmol via INTRAVENOUS
  Filled 2021-09-02: qty 5

## 2021-09-02 MED ORDER — PANTOPRAZOLE SODIUM 40 MG IV SOLR
40.0000 mg | Freq: Two times a day (BID) | INTRAVENOUS | Status: DC
  Administered 2021-09-02 – 2021-09-08 (×13): 40 mg via INTRAVENOUS
  Filled 2021-09-02 (×13): qty 40

## 2021-09-02 NOTE — Progress Notes (Signed)
Patient admitted to the hospital earlier this morning by Dr. Hal Hope  He was admitted to the hospital with generalized weakness and altered mental status.  Patient lives alone.  During my visit, he is awake, but appears to be sleepy at times and slow with his answers.  He does realize he is in the hospital.  He admits to having vomiting prior to admission.  Describes coffee-ground emesis.  Staff does note that he had a dark-colored stool today that was heme positive.  He denies any NSAID use.  He remembers undergoing endoscopy by Dr. Alessandra Bevels 2 years ago.  Assessment/plan  Acute encephalopathy -Likely multifactorial -MRI brain did not show any acute findings -RPR, B12 and TSH levels unremarkable -Suspect that some degree of encephalopathy secondary to dehydration -No signs of infection at this time -He is also on a fair dose of gabapentin as well as baclofen which certainly could be contributing to his encephalopathy -We will hold these medications for now -Continue to follow mental status  Generalized weakness -PT/OT  Melena/coffee-ground emesis -Stool was noted to be heme positive -Hemoglobin elevated, but likely hemoconcentrated -Discussed with GI who will see in a.m. -We will keep n.p.o. after midnight in case EGD is planned -Continue on PPI  High anion gap metabolic acidosis -Possibly related to vomiting -Continue IV hydration  [employer]

## 2021-09-02 NOTE — Progress Notes (Signed)
Pt weak, decreased attention span, slow motor responsiveness and thought process. Loss of bowel and bladder control. Pt remain oriented to name and place, slow responds. Appear very fatigued with decreased energy. Will cont to monitor. SRP, RN

## 2021-09-02 NOTE — Progress Notes (Signed)
Pt lethargic and forgetful with short attention span. Pt had type 7 and dark colored stool. MD made aware. Hemoccult ordered. Oralia Rud, RN

## 2021-09-02 NOTE — H&P (Signed)
History and Physical    Jose Bender GBT:[id number] DOB: [birth year] DOA: 09/01/2021  PCP: [facility]  Patient coming from: Home.  Chief Complaint: Weakness.  HPI: Jose Bender is a 75 y.o. male with history of chronic back pain on gabapentin, depression on Zoloft prior history of GI bleed was brought to the ER after patient was found to be increasingly weak confused by the patient's friends.  Patient states about a week ago patient received flu vaccination and about a week before that received COVID booster.  After receiving the flu vaccine patient had a 1 day of nausea vomiting at times blood-tinged.  Vomiting stopped but then started having feeling weak unable to eat well and had to crawl to move around.  Patient's friend found that patient was getting intensely weak at times confused was brought to the ER.  ED Course: In the ER patient appears confused oriented to his name only moving all extremities CT head unremarkable ammonia levels are normal.  Patient was afebrile.  Labs showed leukocytosis of 13,000 COVID test was negative.  UA shows ketosis patient was given fluid boluses admitted for dehydration and acute encephalopathy with poor oral intake.  EKG shows sinus tachycardia chest x-ray unremarkable.  Review of Systems: As per HPI, rest all negative.   Past Medical History:  Diagnosis Date   Arthritis    Depression     Past Surgical History:  Procedure Laterality Date   BACK SURGERY     BIOPSY  08/12/2019   Procedure: BIOPSY;  Surgeon: Otis Brace, MD;  Location: [facility];  Service: Gastroenterology;;   ESOPHAGOGASTRODUODENOSCOPY (EGD) WITH PROPOFOL N/A 08/12/2019   Procedure: ESOPHAGOGASTRODUODENOSCOPY (EGD) WITH PROPOFOL;  Surgeon: Otis Brace, MD;  Location: [place] [facility];  Service: Gastroenterology;  Laterality: N/A;   EYE SURGERY Bilateral    cataracts   KNEE ARTHROSCOPY Left    LUMBAR LAMINECTOMY/DECOMPRESSION MICRODISCECTOMY  N/A 06/27/2019   Procedure: L3-4 HEMILAMINECTOMY DISCECTOMY;  Surgeon: Deetta Perla, MD;  Location: [facility];  Service: Neurosurgery;  Laterality: N/A;     reports that he quit smoking about 17 years ago. He has never used smokeless tobacco. He reports current alcohol use. He reports that he does not use drugs.  Allergies  Allergen Reactions   Codeine Nausea Only    History reviewed. No pertinent family history.  Prior to Admission medications   Medication Sig Start Date End Date Taking? Authorizing Provider  gabapentin (NEURONTIN) 300 MG capsule Take 300 mg by mouth 3 (three) times daily.   Yes [provider]  sertraline (ZOLOFT) 100 MG tablet Take 100 mg by mouth in the morning and at bedtime.   Yes [provider]  acetaminophen (TYLENOL) 500 MG tablet Take 500 mg by mouth every 8 (eight) hours as needed (pain).  Patient not taking: Reported on 09/01/2021    [provider]  baclofen (LIORESAL) 10 MG tablet Take 5 mg by mouth 2 (two) times daily. Patient not taking: Reported on 09/01/2021    [provider]  folic acid (FOLVITE) 1 MG tablet Take 1 tablet (1 mg total) by mouth daily. Patient not taking: No sig reported 08/13/19   Aline August, MD  Multiple Vitamin (MULTIVITAMIN WITH MINERALS) TABS tablet Take 1 tablet by mouth daily. Patient not taking: Reported on 09/01/2021 08/13/19   Aline August, MD  ondansetron (ZOFRAN) 4 MG tablet Take 1 tablet (4 mg total) by mouth every 6 (six) hours as needed for nausea. Patient not taking: Reported on  09/01/2021 08/13/19   Aline August, MD  pantoprazole (PROTONIX) 40 MG tablet Take 1 tablet (40 mg total) by mouth 2 (two) times daily before a meal. Patient not taking: No sig reported 08/13/19 09/01/21  Aline August, MD  thiamine 100 MG tablet Take 1 tablet (100 mg total) by mouth daily. Patient not taking: No sig reported 08/13/19   Aline August, MD    Physical Exam: Constitutional: Moderately  built and nourished. Vitals:   09/02/21 0000 09/02/21 0100 09/02/21 0127 09/02/21 0129  BP: 130/82 134/63  (!) 147/80  Pulse: 97 76  71  Resp: 12 13  16   Temp:    97.6 F (36.4 C)  TempSrc:    Oral  SpO2: 100% 98%  100%  Weight:   76.8 kg   Height:   5\' 9"  (1.753 m)    Eyes: Anicteric no pallor. ENMT: No discharge from the ears eyes nose and mouth. Neck: No mass felt.  No neck rigidity. Respiratory: No rhonchi or crepitations. Cardiovascular: S1-S2 heard. Abdomen: Soft nontender bowel sound present. Musculoskeletal: No edema. Skin: No rash. Neurologic: Alert awake oriented to time place and person.  Moves all extremities. Psychiatric: Appears normal.  Normal affect.   Labs on Admission: I have personally reviewed following labs and imaging studies  CBC: Recent Labs  Lab 09/01/21 1710  WBC 13.2*  NEUTROABS 11.0*  HGB 16.4  HCT 50.6  MCV 90.8  PLT 213   Basic Metabolic Panel: Recent Labs  Lab 09/01/21 1710 09/02/21 0008  NA 139  --   K 3.8  --   CL 107  --   CO2 15*  --   GLUCOSE 94  --   BUN 39*  --   CREATININE 1.20  --   CALCIUM 9.7  --   MG  --  2.6*  PHOS  --  2.0*   GFR: Estimated Creatinine Clearance: 54 mL/min (by C-G formula based on SCr of 1.2 mg/dL). Liver Function Tests: Recent Labs  Lab 09/01/21 1710  AST 29  ALT 23  ALKPHOS 64  BILITOT 1.9*  PROT 7.5  ALBUMIN 4.3   No results for input(s): LIPASE, AMYLASE in the last 168 hours. Recent Labs  Lab 09/02/21 0008  AMMONIA 28   Coagulation Profile: No results for input(s): INR, PROTIME in the last 168 hours. Cardiac Enzymes: Recent Labs  Lab 09/02/21 0008  CKTOTAL 165   BNP (last 3 results) No results for input(s): PROBNP in the last 8760 hours. HbA1C: No results for input(s): HGBA1C in the last 72 hours. CBG: Recent Labs  Lab 09/01/21 1745  GLUCAP 105*   Lipid Profile: No results for input(s): CHOL, HDL, LDLCALC, TRIG, CHOLHDL, LDLDIRECT in the last 72 hours. Thyroid  Function Tests: Recent Labs    09/02/21 0008  TSH 0.494   Anemia Panel: No results for input(s): VITAMINB12, FOLATE, FERRITIN, TIBC, IRON, RETICCTPCT in the last 72 hours. Urine analysis:    Component Value Date/Time   COLORURINE YELLOW 09/01/2021 1838   APPEARANCEUR CLEAR 09/01/2021 1838   LABSPEC 1.021 09/01/2021 1838   PHURINE 6.0 09/01/2021 1838   GLUCOSEU NEGATIVE 09/01/2021 1838   HGBUR MODERATE (A) 09/01/2021 1838   BILIRUBINUR NEGATIVE 09/01/2021 1838   KETONESUR 80 (A) 09/01/2021 1838   PROTEINUR 100 (A) 09/01/2021 1838   NITRITE NEGATIVE 09/01/2021 1838   LEUKOCYTESUR NEGATIVE 09/01/2021 1838   Sepsis Labs: @LABRCNTIP (procalcitonin:4,lacticidven:4) ) Recent Results (from the past 240 hour(s))  Resp Panel by RT-PCR (Flu A&B, Covid) Nasopharyngeal  Swab     Status: None   Collection Time: 09/02/21 12:10 AM   Specimen: Nasopharyngeal Swab; Nasopharyngeal(NP) swabs in vial transport medium  Result Value Ref Range Status   SARS Coronavirus 2 by RT PCR NEGATIVE NEGATIVE Final    Comment: (NOTE) SARS-CoV-2 target nucleic acids are NOT DETECTED.  The SARS-CoV-2 RNA is generally detectable in upper respiratory specimens during the acute phase of infection. The lowest concentration of SARS-CoV-2 viral copies this assay can detect is 138 copies/mL. A negative result does not preclude SARS-Cov-2 infection and should not be used as the sole basis for treatment or other patient management decisions. A negative result may occur with  improper specimen collection/handling, submission of specimen other than nasopharyngeal swab, presence of viral mutation(s) within the areas targeted by this assay, and inadequate number of viral copies(<138 copies/mL). A negative result must be combined with clinical observations, patient history, and epidemiological information. The expected result is Negative.  Fact Sheet for Patients:  EntrepreneurPulse.com.au  Fact Sheet  for Healthcare Providers:  IncredibleEmployment.be  This test is no t yet approved or cleared by the [place] FDA and  has been authorized for detection and/or diagnosis of SARS-CoV-2 by FDA under an Emergency Use Authorization (EUA). This EUA will remain  in effect (meaning this test can be used) for the duration of the COVID-19 declaration under Section 564(b)(1) of the Act, 21 U.S.C.section 360bbb-3(b)(1), unless the authorization is terminated  or revoked sooner.       Influenza A by PCR NEGATIVE NEGATIVE Final   Influenza B by PCR NEGATIVE NEGATIVE Final    Comment: (NOTE) The Xpert Xpress SARS-CoV-2/FLU/RSV plus assay is intended as an aid in the diagnosis of influenza from Nasopharyngeal swab specimens and should not be used as a sole basis for treatment. Nasal washings and aspirates are unacceptable for Xpert Xpress SARS-CoV-2/FLU/RSV testing.  Fact Sheet for Patients: EntrepreneurPulse.com.au  Fact Sheet for Healthcare Providers: IncredibleEmployment.be  This test is not yet approved or cleared by the [place] FDA and has been authorized for detection and/or diagnosis of SARS-CoV-2 by FDA under an Emergency Use Authorization (EUA). This EUA will remain in effect (meaning this test can be used) for the duration of the COVID-19 declaration under Section 564(b)(1) of the Act, 21 U.S.C. section 360bbb-3(b)(1), unless the authorization is terminated or revoked.  Performed at [facility], [place] [address], [place], [place] [address]      Radiological Exams on Admission: DG Chest 2 View  Result Date: 09/01/2021 CLINICAL DATA:  Altered mental status EXAM: CHEST - 2 VIEW COMPARISON:  None. FINDINGS: Heart and mediastinal contours are within normal limits. No focal opacities or effusions. No acute bony abnormality. IMPRESSION: No active cardiopulmonary disease. Electronically Signed   By:  Rolm Baptise M.D.   On: 09/01/2021 17:26   CT Head Wo Contrast  Result Date: 09/01/2021 CLINICAL DATA:  Head trauma, minor (Age >= 65y) EXAM: CT HEAD WITHOUT CONTRAST TECHNIQUE: Contiguous axial images were obtained from the base of the skull through the vertex without intravenous contrast. COMPARISON:  None. FINDINGS: Brain: There is no acute intracranial hemorrhage, mass effect, or edema. Gray-white differentiation is preserved. There is no extra-axial fluid collection. Prominence of the ventricles and sulci reflects mild parenchymal volume loss. Patchy and confluent hypoattenuation in the supratentorial white matter is nonspecific but probably reflects moderate chronic microvascular ischemic changes. Vascular: There is atherosclerotic calcification at the skull base. Skull: Calvarium is unremarkable. Sinuses/Orbits: No acute finding. Other: None.  IMPRESSION: No evidence of acute intracranial injury. Electronically Signed   By: Macy Mis M.D.   On: 09/01/2021 18:03    EKG: Independently reviewed.  Sinus tachycardia.  Assessment/Plan Principal Problem:   Weakness Active Problems:   Chronic low back pain   Acute encephalopathy    Acute encephalopathy patient is only oriented to his name at this time.  Appears confused has difficulty remembering events in a chronological order.  We will get MRI brain check RPR B12 TSH levels.  Presently no signs of meningitis or encephalitis afebrile.  Urine drug screen is pending. Anion gap metabolic acidosis cause not clear.  Gently hydrating follow metabolic panel check lactic acid levels salicylate levels. Generalized weakness and appears dehydration we will gently hydrate.  Replace phosphorus. Total bilirubin is elevated we will recheck metabolic panel and LFTs. Chronic low back pain on gabapentin presently holding gabapentin due to confusion. History of depression on Zoloft holding Zoloft for now until patient confusion improves.   DVT prophylaxis:  SCDs.  Avoiding anticoagulation in case patient needs lumbar puncture. Code Status: Full code. Family Communication: Discussed with patient. Disposition Plan: To be determined. Consults called: None. Admission status: Observation.   Rise Patience MD [facility] Pager [phone].  If 7PM-7AM, please contact night-coverage [email or web address] Password [email or web address]  09/02/2021, 3:18 AM

## 2021-09-02 NOTE — Plan of Care (Signed)
  Problem: Clinical Measurements: Goal: Diagnostic test results will improve Outcome: Progressing   Problem: Skin Integrity: Goal: Risk for impaired skin integrity will decrease Outcome: Progressing

## 2021-09-03 DIAGNOSIS — R4182 Altered mental status, unspecified: Secondary | ICD-10-CM | POA: Diagnosis not present

## 2021-09-03 DIAGNOSIS — E876 Hypokalemia: Secondary | ICD-10-CM | POA: Diagnosis not present

## 2021-09-03 DIAGNOSIS — R41841 Cognitive communication deficit: Secondary | ICD-10-CM | POA: Diagnosis not present

## 2021-09-03 DIAGNOSIS — F32A Depression, unspecified: Secondary | ICD-10-CM | POA: Diagnosis present

## 2021-09-03 DIAGNOSIS — R1312 Dysphagia, oropharyngeal phase: Secondary | ICD-10-CM | POA: Diagnosis not present

## 2021-09-03 DIAGNOSIS — K2210 Ulcer of esophagus without bleeding: Secondary | ICD-10-CM | POA: Diagnosis not present

## 2021-09-03 DIAGNOSIS — K449 Diaphragmatic hernia without obstruction or gangrene: Secondary | ICD-10-CM | POA: Diagnosis present

## 2021-09-03 DIAGNOSIS — Z79899 Other long term (current) drug therapy: Secondary | ICD-10-CM | POA: Diagnosis not present

## 2021-09-03 DIAGNOSIS — K922 Gastrointestinal hemorrhage, unspecified: Secondary | ICD-10-CM

## 2021-09-03 DIAGNOSIS — Z20822 Contact with and (suspected) exposure to covid-19: Secondary | ICD-10-CM | POA: Diagnosis present

## 2021-09-03 DIAGNOSIS — E860 Dehydration: Secondary | ICD-10-CM

## 2021-09-03 DIAGNOSIS — E512 Wernicke's encephalopathy: Secondary | ICD-10-CM | POA: Diagnosis present

## 2021-09-03 DIAGNOSIS — K208 Other esophagitis without bleeding: Secondary | ICD-10-CM | POA: Diagnosis not present

## 2021-09-03 DIAGNOSIS — E440 Moderate protein-calorie malnutrition: Secondary | ICD-10-CM | POA: Diagnosis present

## 2021-09-03 DIAGNOSIS — D500 Iron deficiency anemia secondary to blood loss (chronic): Secondary | ICD-10-CM | POA: Diagnosis not present

## 2021-09-03 DIAGNOSIS — R5381 Other malaise: Secondary | ICD-10-CM | POA: Diagnosis present

## 2021-09-03 DIAGNOSIS — Z6825 Body mass index (BMI) 25.0-25.9, adult: Secondary | ICD-10-CM | POA: Diagnosis not present

## 2021-09-03 DIAGNOSIS — G9341 Metabolic encephalopathy: Secondary | ICD-10-CM | POA: Diagnosis present

## 2021-09-03 DIAGNOSIS — K920 Hematemesis: Secondary | ICD-10-CM | POA: Diagnosis not present

## 2021-09-03 DIAGNOSIS — D649 Anemia, unspecified: Secondary | ICD-10-CM | POA: Diagnosis not present

## 2021-09-03 DIAGNOSIS — K5712 Diverticulitis of small intestine without perforation or abscess without bleeding: Secondary | ICD-10-CM | POA: Diagnosis not present

## 2021-09-03 DIAGNOSIS — K921 Melena: Secondary | ICD-10-CM | POA: Diagnosis not present

## 2021-09-03 DIAGNOSIS — K2980 Duodenitis without bleeding: Secondary | ICD-10-CM | POA: Diagnosis present

## 2021-09-03 DIAGNOSIS — G9340 Encephalopathy, unspecified: Secondary | ICD-10-CM | POA: Diagnosis not present

## 2021-09-03 DIAGNOSIS — G8929 Other chronic pain: Secondary | ICD-10-CM | POA: Diagnosis present

## 2021-09-03 DIAGNOSIS — E872 Acidosis, unspecified: Secondary | ICD-10-CM | POA: Diagnosis present

## 2021-09-03 DIAGNOSIS — Z885 Allergy status to narcotic agent status: Secondary | ICD-10-CM | POA: Diagnosis not present

## 2021-09-03 DIAGNOSIS — R5383 Other fatigue: Secondary | ICD-10-CM | POA: Diagnosis not present

## 2021-09-03 DIAGNOSIS — K2211 Ulcer of esophagus with bleeding: Secondary | ICD-10-CM | POA: Diagnosis present

## 2021-09-03 DIAGNOSIS — Z87891 Personal history of nicotine dependence: Secondary | ICD-10-CM | POA: Diagnosis not present

## 2021-09-03 DIAGNOSIS — R531 Weakness: Secondary | ICD-10-CM | POA: Diagnosis not present

## 2021-09-03 DIAGNOSIS — M6281 Muscle weakness (generalized): Secondary | ICD-10-CM | POA: Diagnosis not present

## 2021-09-03 DIAGNOSIS — K5713 Diverticulitis of small intestine without perforation or abscess with bleeding: Secondary | ICD-10-CM | POA: Diagnosis not present

## 2021-09-03 DIAGNOSIS — Z7401 Bed confinement status: Secondary | ICD-10-CM | POA: Diagnosis not present

## 2021-09-03 DIAGNOSIS — R2681 Unsteadiness on feet: Secondary | ICD-10-CM | POA: Diagnosis not present

## 2021-09-03 DIAGNOSIS — M545 Low back pain, unspecified: Secondary | ICD-10-CM | POA: Diagnosis present

## 2021-09-03 LAB — CBC
HCT: 40.6 % (ref 39.0–52.0)
Hemoglobin: 13.2 g/dL (ref 13.0–17.0)
MCH: 29.7 pg (ref 26.0–34.0)
MCHC: 32.5 g/dL (ref 30.0–36.0)
MCV: 91.2 fL (ref 80.0–100.0)
Platelets: 138 10*3/uL — ABNORMAL LOW (ref 150–400)
RBC: 4.45 MIL/uL (ref 4.22–5.81)
RDW: 15.3 % (ref 11.5–15.5)
WBC: 7.8 10*3/uL (ref 4.0–10.5)
nRBC: 0 % (ref 0.0–0.2)

## 2021-09-03 LAB — COMPREHENSIVE METABOLIC PANEL
ALT: 15 U/L (ref 0–44)
AST: 19 U/L (ref 15–41)
Albumin: 3.2 g/dL — ABNORMAL LOW (ref 3.5–5.0)
Alkaline Phosphatase: 53 U/L (ref 38–126)
Anion gap: 8 (ref 5–15)
BUN: 21 mg/dL (ref 8–23)
CO2: 23 mmol/L (ref 22–32)
Calcium: 8.8 mg/dL — ABNORMAL LOW (ref 8.9–10.3)
Chloride: 104 mmol/L (ref 98–111)
Creatinine, Ser: 0.82 mg/dL (ref 0.61–1.24)
GFR, Estimated: >60 mL/min (ref >60)
Glucose, Bld: 97 mg/dL (ref 70–99)
Potassium: 3.1 mmol/L — ABNORMAL LOW (ref 3.5–5.1)
Sodium: 135 mmol/L (ref 135–145)
Total Bilirubin: 1.4 mg/dL — ABNORMAL HIGH (ref 0.3–1.2)
Total Protein: 5.5 g/dL — ABNORMAL LOW (ref 6.5–8.1)

## 2021-09-03 LAB — MAGNESIUM: Magnesium: 2 mg/dL (ref 1.7–2.4)

## 2021-09-03 MED ORDER — LACTATED RINGERS IV SOLN: INTRAVENOUS | Status: AC

## 2021-09-03 MED ORDER — POTASSIUM CHLORIDE 10 MEQ/100ML IV SOLN
10.0000 meq | INTRAVENOUS | Status: AC
Start: 2021-09-03 — End: 2021-09-03
  Administered 2021-09-03 (×6): 10 meq via INTRAVENOUS
  Filled 2021-09-03 (×6): qty 100

## 2021-09-03 MED ORDER — THIAMINE HCL 100 MG/ML IJ SOLN
500.0000 mg | Freq: Three times a day (TID) | INTRAVENOUS | Status: AC
  Administered 2021-09-03 – 2021-09-06 (×9): 500 mg via INTRAVENOUS
  Filled 2021-09-03 (×11): qty 5

## 2021-09-03 NOTE — Evaluation (Signed)
Physical Therapy Evaluation Patient Details Name: Jose Bender MRN: [id number] DOB: 28-Sep-1946 Today's Date: 09/03/2021  History of Present Illness  75 year old male presenting with vomiting prior to admission.  Describes coffee-ground emesis.  Staff does note that he had a dark-colored stool day of admission that was heme positive. Pt's friends brought him to the ED due to weakness and confusion and pt admitted 09/01/21 for acute encephalopathy and weakness. PMH: of chronic back pain on gabapentin, depression on Zoloft prior history of GI bleed  Clinical Impression  Pt admitted with above diagnosis.  Pt currently with functional limitations due to the deficits listed below (see PT Problem List). Pt will benefit from skilled PT to increase their independence and safety with mobility to allow discharge to the venue listed below.  Pt very sleepy and groggy on arrival to room and appears to require more assist this afternoon then this morning according to RN.  Pt requiring multimodal cues as well as cues to initiate and participate.  Pt poor historian however from home alone.  Recommend d/c to SNF for rehab however will continue to update recommendations if/when pt progresses.        Recommendations for follow up therapy are one component of a multi-disciplinary discharge planning process, led by the attending physician.  Recommendations may be updated based on patient status, additional functional criteria and insurance authorization.  Follow Up Recommendations SNF    Equipment Recommendations  Rolling walker with 5" wheels    Recommendations for Other Services       Precautions / Restrictions Precautions Precautions: Fall      Mobility  Bed Mobility Overal bed mobility: Needs Assistance Bed Mobility: Supine to Sit     Supine to sit: Max assist     General bed mobility comments: pt initiating and then stopping, possibly cognitive in nature as pt able to assist more once manually  cued; upon sitting, pt slumping to side or posteriorly however able to self correct with verbal cue    Transfers Overall transfer level: Needs assistance Equipment used: Rolling walker (2 wheeled) Transfers: Sit to/from [facility] Sit to Stand: Min assist Stand pivot transfers: Min assist       General transfer comment: pt attempted however requiring assist to rise, verbal cues for technique and safety, upon standing pt requested only to recliner; pt felt unable to ambulate  Ambulation/Gait                Stairs            Wheelchair Mobility    Modified Rankin (Stroke Patients Only)       Balance Overall balance assessment: Needs assistance Sitting-balance support: Bilateral upper extremity supported;Feet supported Sitting balance-Leahy Scale: Poor Sitting balance - Comments: reliant on UE assist   Standing balance support: Bilateral upper extremity supported Standing balance-Leahy Scale: Poor Standing balance comment: required UE support                             Pertinent Vitals/Pain Pain Assessment: No/denies pain    Home Living Family/patient expects to be discharged to:: Private residence Living Arrangements: Alone Available Help at Discharge: Friend(s);Available PRN/intermittently Type of Home: House Home Access: Stairs to enter Entrance Stairs-Rails: Left Entrance Stairs-Number of Steps: 3 Home Layout: One level Home Equipment: Shower seat;Grab bars - tub/shower;Hand held shower head;Cane - quad Additional Comments: per OT note, pt more confused and sleepy this afternoon during PT  evaluation    Prior Function Level of Independence: Independent with assistive device(s)         Comments: Pt reports that he uses his quad cane consistently. Pt endorses 4 falls in past year while navigating the stairs into this home esp when carrying groceries. Pt performs all cooking.cleaning/laundry. Pt drives, performs own  grocery shopping.     Hand Dominance   Dominant Hand: Right    Extremity/Trunk Assessment        Lower Extremity Assessment Lower Extremity Assessment: Generalized weakness       Communication   Communication: HOH  Cognition Arousal/Alertness: Lethargic Behavior During Therapy: Flat affect Overall Cognitive Status: No family/caregiver present to determine baseline cognitive functioning Area of Impairment: Problem solving;Following commands;Safety/judgement                       Following Commands: Follows one step commands with increased time;Follows one step commands inconsistently Safety/Judgement: Decreased awareness of safety;Decreased awareness of deficits   Problem Solving: Slow processing;Requires verbal cues;Difficulty sequencing        General Comments      Exercises     Assessment/Plan    PT Assessment Patient needs continued PT services  PT Problem List Decreased strength;Decreased mobility;Decreased balance;Decreased activity tolerance;Decreased knowledge of use of DME;Decreased safety awareness;Decreased cognition       PT Treatment Interventions DME instruction;Therapeutic exercise;Stair training;Neuromuscular re-education;Functional mobility training;Patient/family education;Gait training;Balance training    PT Goals (Current goals can be found in the Care Plan section)  Acute Rehab PT Goals PT Goal Formulation: With patient Time For Goal Achievement: 09/17/21 Potential to Achieve Goals: Good    Frequency Min 2X/week   Barriers to discharge        Co-evaluation               AM-PAC PT "6 Clicks" Mobility  Outcome Measure Help needed turning from your back to your side while in a flat bed without using bedrails?: A Lot Help needed moving from lying on your back to sitting on the side of a flat bed without using bedrails?: A Lot Help needed moving to and from a bed to a chair (including a wheelchair)?: A Lot Help needed  standing up from a chair using your arms (e.g., wheelchair or bedside chair)?: A Lot Help needed to walk in hospital room?: A Lot Help needed climbing 3-5 steps with a railing? : Total 6 Click Score: 11    End of Session Equipment Utilized During Treatment: Gait belt Activity Tolerance: Patient limited by fatigue Patient left: in chair;with call bell/phone within reach;with chair alarm set Nurse Communication: Mobility status PT Visit Diagnosis: Difficulty in walking, not elsewhere classified (R26.2);Muscle weakness (generalized) (M62.81)    Time: 9432-7614 PT Time Calculation (min) (ACUTE ONLY): 18 min   Charges:   PT Evaluation $PT Eval Low Complexity: 1 Low        Kati PT, DPT Acute Rehabilitation Services Pager: [phone] Office: [place] 09/03/2021, 3:46 PM

## 2021-09-03 NOTE — Progress Notes (Addendum)
PROGRESS NOTE    Dyson Sevey  XFG:[id number] DOB: 05-Sep-1946 DOA: 09/01/2021 PCP: [facility]    Brief Narrative:  75 year old male with a history of chronic back pain on gabapentin and baclofen, depression on Zoloft, prior history of GI bleed, admitted to the hospital with increasing weakness and confusion.  Reportedly symptoms began after having a COVID booster shot.  He did report having nausea and vomiting which was blood-tinged.  On admission, he was noted to be dehydrated.  Work-up for encephalopathy was otherwise unrevealing.  Suspect that dehydration coupled with polypharmacy may be causing his symptoms.  He was also noted to have heme positive stools since admission.  Seen by GI with plans for EGD on 10/19.  Seen by OT with recommendations for skilled nursing facility placement.   Assessment & Plan:   Principal Problem:   Weakness Active Problems:   Chronic low back pain   Acute encephalopathy  Acute encephalopathy -Likely multifactorial -MRI brain did not show any acute findings -RPR, B12, ammonia and TSH levels unremarkable -Suspect that some degree of encephalopathy secondary to dehydration -No signs of infection at this time -He is also on a fair dose of gabapentin as well as baclofen which certainly could be contributing to his encephalopathy -We will hold these medications for now -Continue to follow mental status.  He appears to be having slow improvement although not back to baseline as of yet -Patient's friend reports that his p.o. intake has been poor for a while now.  Review of records indicate that he was taking thiamine prior to admission.  We will check B1 level.  Started on high-dose IV thiamine for any component of Wernicke's encephalopathy.   Generalized weakness -Seen by OT with recommendations for skilled nursing facility.  PT note currently pending.   Melena/coffee-ground emesis -Stool was noted to be heme  positive -Hemoglobin elevated, but likely hemoconcentrated -Last endoscopy in 2020 showed duodenal ulcer/esophageal ulcers/gastritis -Seen by GI with plans for EGD in a.m. -Continue on PPI   High anion gap metabolic acidosis -Possibly related to vomiting -Resolved with IV fluids  Hypokalemia -Replace -Magnesium 2.0    DVT prophylaxis: SCDs Start: 09/02/21 0313  Code Status: Full code Family Communication: Discussed with patient's friend, Lavell Islam who informed me that patient's power of attorney is Mr. Renaee Munda [phone].  I updated Mr. Sharlene Motts on patient's condition and plan of care.  Mr. Sharlene Motts has asked that if we are unable to reach him and an urgent decision/consent is needed, to please contact patient's friend Mr. [name] in his place. Disposition Plan: Status is: Inpatient  The patient will require care spanning > 2 midnights and should be moved to inpatient because: Continued GI work-up with EGD, mental status has not returned to baseline yet        Consultants:  Gastroenterology  Procedures:    Antimicrobials:      Subjective: He has not had any vomiting or abdominal pain.  His friends are sitting at the bedside and feel that he may still be a little confused, but overall improving since prior to admission  Objective: Vitals:   09/02/21 0503 09/02/21 2039 09/03/21 0508 09/03/21 0915  BP: 116/62 119/61 123/60 (!) 147/77  Pulse: 88 76 69 70  Resp: 16 16 18 15   Temp: (!) 97.3 F (36.3 C) 99.3 F (37.4 C) 97.7 F (36.5 C) 98.2 F (36.8 C)  TempSrc: Oral Oral Oral Oral  SpO2: 100% 96%  99%  Weight:  76.8 kg  Height:    5\' 9"  (1.753 m)    Intake/Output Summary (Last 24 hours) at 09/03/2021 1124 Last data filed at 09/03/2021 0934 Gross per 24 hour  Intake 2419.91 ml  Output 1700 ml  Net 719.91 ml   Filed Weights   09/01/21 1615 09/02/21 0127 09/03/21 0915  Weight: 81.6 kg 76.8 kg 76.8 kg    Examination:  General exam: Appears  calm and comfortable  Respiratory system: Clear to auscultation. Respiratory effort normal. Cardiovascular system: S1 & S2 heard, RRR. No JVD, murmurs, rubs, gallops or clicks. No pedal edema. Gastrointestinal system: Abdomen is nondistended, soft and nontender. No organomegaly or masses felt. Normal bowel sounds heard. Central nervous system: No focal neurological deficits. Extremities: Symmetric 5 x 5 power. Skin: No rashes, lesions or ulcers Psychiatry: Still slow to respond at times and has some confusion, but seems to be improving    Data Reviewed: I have personally reviewed following labs and imaging studies  CBC: Recent Labs  Lab 09/01/21 1710 09/02/21 0349 09/03/21 0331  WBC 13.2* 10.0 7.8  NEUTROABS 11.0* 7.6  --   HGB 16.4 15.9 13.2  HCT 50.6 49.2 40.6  MCV 90.8 91.8 91.2  PLT 218 185 026*   Basic Metabolic Panel: Recent Labs  Lab 09/01/21 1710 09/02/21 0008 09/02/21 0349 09/03/21 0331  NA 139  --  137 135  K 3.8  --  3.6 3.1*  CL 107  --  105 104  CO2 15*  --  18* 23  GLUCOSE 94  --  84 97  BUN 39*  --  33* 21  CREATININE 1.20  --  1.11 0.82  CALCIUM 9.7  --  9.4 8.8*  MG  --  2.6*  --  2.0  PHOS  --  2.0*  --   --    GFR: Estimated Creatinine Clearance: 79 mL/min (by C-G formula based on SCr of 0.82 mg/dL). Liver Function Tests: Recent Labs  Lab 09/01/21 1710 09/02/21 0349 09/03/21 0331  AST 29 25 19   ALT 23 20 15   ALKPHOS 64 61 53  BILITOT 1.9* 1.7* 1.4*  PROT 7.5 7.1 5.5*  ALBUMIN 4.3 4.1 3.2*   No results for input(s): LIPASE, AMYLASE in the last 168 hours. Recent Labs  Lab 09/02/21 0008  AMMONIA 28   Coagulation Profile: No results for input(s): INR, PROTIME in the last 168 hours. Cardiac Enzymes: Recent Labs  Lab 09/02/21 0008  CKTOTAL 165   BNP (last 3 results) No results for input(s): PROBNP in the last 8760 hours. HbA1C: No results for input(s): HGBA1C in the last 72 hours. CBG: Recent Labs  Lab 09/01/21 1745  GLUCAP  105*   Lipid Profile: No results for input(s): CHOL, HDL, LDLCALC, TRIG, CHOLHDL, LDLDIRECT in the last 72 hours. Thyroid Function Tests: Recent Labs    09/02/21 0349  TSH 0.432   Anemia Panel: Recent Labs    09/02/21 0349  VITAMINB12 572   Sepsis Labs: Recent Labs  Lab 09/02/21 0349  LATICACIDVEN 0.7    Recent Results (from the past 240 hour(s))  Resp Panel by RT-PCR (Flu A&B, Covid) Nasopharyngeal Swab     Status: None   Collection Time: 09/02/21 12:10 AM   Specimen: Nasopharyngeal Swab; Nasopharyngeal(NP) swabs in vial transport medium  Result Value Ref Range Status   SARS Coronavirus 2 by RT PCR NEGATIVE NEGATIVE Final    Comment: (NOTE) SARS-CoV-2 target nucleic acids are NOT DETECTED.  The SARS-CoV-2 RNA is generally detectable  in upper respiratory specimens during the acute phase of infection. The lowest concentration of SARS-CoV-2 viral copies this assay can detect is 138 copies/mL. A negative result does not preclude SARS-Cov-2 infection and should not be used as the sole basis for treatment or other patient management decisions. A negative result may occur with  improper specimen collection/handling, submission of specimen other than nasopharyngeal swab, presence of viral mutation(s) within the areas targeted by this assay, and inadequate number of viral copies(<138 copies/mL). A negative result must be combined with clinical observations, patient history, and epidemiological information. The expected result is Negative.  Fact Sheet for Patients:  EntrepreneurPulse.com.au  Fact Sheet for Healthcare Providers:  IncredibleEmployment.be  This test is no t yet approved or cleared by the [place] FDA and  has been authorized for detection and/or diagnosis of SARS-CoV-2 by FDA under an Emergency Use Authorization (EUA). This EUA will remain  in effect (meaning this test can be used) for the duration of the COVID-19  declaration under Section 564(b)(1) of the Act, 21 U.S.C.section 360bbb-3(b)(1), unless the authorization is terminated  or revoked sooner.       Influenza A by PCR NEGATIVE NEGATIVE Final   Influenza B by PCR NEGATIVE NEGATIVE Final    Comment: (NOTE) The Xpert Xpress SARS-CoV-2/FLU/RSV plus assay is intended as an aid in the diagnosis of influenza from Nasopharyngeal swab specimens and should not be used as a sole basis for treatment. Nasal washings and aspirates are unacceptable for Xpert Xpress SARS-CoV-2/FLU/RSV testing.  Fact Sheet for Patients: EntrepreneurPulse.com.au  Fact Sheet for Healthcare Providers: IncredibleEmployment.be  This test is not yet approved or cleared by the [place] FDA and has been authorized for detection and/or diagnosis of SARS-CoV-2 by FDA under an Emergency Use Authorization (EUA). This EUA will remain in effect (meaning this test can be used) for the duration of the COVID-19 declaration under Section 564(b)(1) of the Act, 21 U.S.C. section 360bbb-3(b)(1), unless the authorization is terminated or revoked.  Performed at [facility], [place] [address] [place], [place] [address]          Radiology Studies: DG Chest 2 View  Result Date: 09/01/2021 CLINICAL DATA:  Altered mental status EXAM: CHEST - 2 VIEW COMPARISON:  None. FINDINGS: Heart and mediastinal contours are within normal limits. No focal opacities or effusions. No acute bony abnormality. IMPRESSION: No active cardiopulmonary disease. Electronically Signed   By: Rolm Baptise M.D.   On: 09/01/2021 17:26   CT Head Wo Contrast  Result Date: 09/01/2021 CLINICAL DATA:  Head trauma, minor (Age >= 65y) EXAM: CT HEAD WITHOUT CONTRAST TECHNIQUE: Contiguous axial images were obtained from the base of the skull through the vertex without intravenous contrast. COMPARISON:  None. FINDINGS: Brain: There is no acute intracranial  hemorrhage, mass effect, or edema. Gray-white differentiation is preserved. There is no extra-axial fluid collection. Prominence of the ventricles and sulci reflects mild parenchymal volume loss. Patchy and confluent hypoattenuation in the supratentorial white matter is nonspecific but probably reflects moderate chronic microvascular ischemic changes. Vascular: There is atherosclerotic calcification at the skull base. Skull: Calvarium is unremarkable. Sinuses/Orbits: No acute finding. Other: None. IMPRESSION: No evidence of acute intracranial injury. Electronically Signed   By: Macy Mis M.D.   On: 09/01/2021 18:03   MR BRAIN WO CONTRAST  Result Date: 09/02/2021 CLINICAL DATA:  Mental status change, unknown cause. Additional history provided: Nausea and vomiting for 1 day after receiving the flu vaccine, subsequent weakness, confusion. EXAM: MRI HEAD  WITHOUT CONTRAST TECHNIQUE: Multiplanar, multiecho pulse sequences of the brain and surrounding structures were obtained without intravenous contrast. COMPARISON:  Head CT 09/01/2021. FINDINGS: Brain: Intermittently motion degraded examination, limiting evaluation. Most notably, there is severe motion degradation of the axial T2 TSE sequence, moderate motion degradation of the axial SWI sequence, moderate motion degradation of the axial T1 weighted sequence and moderate motion degradation of the coronal T2 TSE sequence. Mild parenchymal atrophy. Moderate multifocal T2 FLAIR hyperintense signal abnormality within the cerebral white matter, nonspecific but compatible with chronic small vessel ischemic disease. Chronic small vessel ischemic changes are also present within the bilateral basal ganglia and within the pons. There is no acute infarct. No evidence of an intracranial mass. No chronic intracranial blood products. No extra-axial fluid collection. No midline shift. Vascular: Maintained flow voids within the proximal large arterial vessels. Skull and upper  cervical spine: No focal suspicious marrow lesion. Sinuses/Orbits: Visualized orbits show no acute finding. Other: Trace fluid within the left mastoid air cells. IMPRESSION: Motion degraded exam, as described. No evidence of acute intracranial abnormality. Moderate chronic small vessel ischemic changes within the cerebral white matter. Chronic small vessel ischemic changes are also present within the bilateral basal ganglia and within the pons. Mild generalized parenchymal atrophy. Electronically Signed   By: Kellie Simmering D.O.   On: 09/02/2021 12:45   CT RENAL STONE STUDY  Result Date: 09/02/2021 CLINICAL DATA:  Back pain, flank pain EXAM: CT ABDOMEN AND PELVIS WITHOUT CONTRAST TECHNIQUE: Multidetector CT imaging of the abdomen and pelvis was performed following the standard protocol without IV contrast. COMPARISON:  08/10/2019 FINDINGS: Lower chest: Hiatal hernia. No pleural or pericardial effusion. Coarse subpleural linear opacities posteriorly in both lung bases. Hepatobiliary: Stable small hepatic cysts. No calcified stones. The gallbladder is nondistended. No biliary ductal dilatation. Pancreas: Unremarkable. No pancreatic ductal dilatation or surrounding inflammatory changes. Spleen: Normal in size without focal abnormality. Adrenals/Urinary Tract: Upper pole right and lower pole left renal lesions possibly cysts, incompletely characterized. No urolithiasis or hydronephrosis. Urinary bladder is physiologically distended. Stomach/Bowel: Moderate hiatal hernia involving much of the fundus. The stomach is nondilated. The small bowel is non distended. Normal appendix. The colon is nondilated, unremarkable. Vascular/Lymphatic: Scattered aortoiliac calcified plaque without aneurysm. No abdominal or pelvic adenopathy. Reproductive: Mild prostate enlargement with central coarse calcifications. Other: Left pelvic phleboliths.  No ascites.  No free air. Musculoskeletal: Multilevel lower thoracic and lumbar  spondylitic change. No fracture or worrisome bone lesion. IMPRESSION: 1. No acute findings.  No urolithiasis. 2. Hiatal hernia 3.  Aortic Atherosclerosis (ICD10-170.0). Electronically Signed   By: Lucrezia Europe M.D.   On: 09/02/2021 12:26        Scheduled Meds:  feeding supplement  237 mL Oral BID BM   pantoprazole (PROTONIX) IV  40 mg Intravenous Q12H   Continuous Infusions:  lactated ringers     potassium chloride 10 mEq (09/03/21 1011)     LOS: 0 days    Time spent: 8mins    Kathie Dike, MD [facility]   If 7PM-7AM, please contact night-coverage [email or web address]  09/03/2021, 11:24 AM

## 2021-09-03 NOTE — TOC Initial Note (Signed)
Transition of Care [facility]) - Initial/Assessment Note    Patient Details  Name: Jose Bender MRN: [id number] Date of Birth: Jul 20, 1946  Transition of [facility]) CM/SW Contact:    Leeroy Cha, RN Phone Number: 09/03/2021, 9:06 AM  Clinical Narrative:                  admitted to the hospital with generalized weakness and altered mental status.  Patient lives alone.  During my visit, he is awake, but appears to be sleepy at times and slow with his answers.  He does realize he is in the hospital.  He admits to having vomiting prior to admission.  Describes coffee-ground emesis.  Staff does note that he had a dark-colored stool today that was heme positive.  He denies any NSAID use.  He remembers undergoing endoscopy by Dr. Alessandra Bevels 2 years ago.  TOC PLAN OF CARE: following for progression. Should be able to return to home with self care  Expected Discharge Plan: Home/Self Care Barriers to Discharge: No Barriers Identified   Patient Goals and CMS Choice Patient states their goals for this hospitalization and ongoing recovery are:: to go home CMS Medicare.gov Compare Post Acute Care list provided to:: Patient Choice offered to / list presented to : Patient  Expected Discharge Plan and Services Expected Discharge Plan: Home/Self Care   Discharge Planning Services: CM Consult   Living arrangements for the past 2 months: Single Family Home                                      Prior Living Arrangements/Services Living arrangements for the past 2 months: Single Family Home Lives with:: Self Patient language and need for interpreter reviewed:: Yes Do you feel safe going back to the place where you live?: Yes            Criminal Activity/Legal Involvement Pertinent to Current Situation/Hospitalization: No - Comment as needed  Activities of Daily Living Home Assistive Devices/Equipment: Eyeglasses, Cane (specify quad or straight) (quad cane) ADL Screening (condition  at time of admission) Patient's cognitive ability adequate to safely complete daily activities?: Yes Is the patient deaf or have difficulty hearing?: Yes Does the patient have difficulty seeing, even when wearing glasses/contacts?: No Does the patient have difficulty concentrating, remembering, or making decisions?: Yes Patient able to express need for assistance with ADLs?: Yes Does the patient have difficulty dressing or bathing?: No Independently performs ADLs?: No Communication: Independent Dressing (OT): Needs assistance Is this a change from baseline?: Change from baseline, expected to last <3days Grooming: Independent Feeding: Independent Bathing: Needs assistance Is this a change from baseline?: Change from baseline, expected to last <3 days Toileting: Needs assistance Is this a change from baseline?: Change from baseline, expected to last <3 days In/Out Bed: Needs assistance Is this a change from baseline?: Change from baseline, expected to last <3 days Walks in Home: Independent with device (comment) (cane) Does the patient have difficulty walking or climbing stairs?: Yes Weakness of Legs: Left Weakness of Arms/Hands: None  Permission Sought/Granted                  Emotional Assessment Appearance:: Appears stated age     Orientation: : Oriented to Self, Oriented to Place, Oriented to  Time, Oriented to Situation Alcohol / Substance Use: Not Applicable Psych Involvement: No (comment)  Admission diagnosis:  Weakness [R53.1] Acute encephalopathy [G93.40] Patient  Active Problem List   Diagnosis Date Noted   Weakness 09/02/2021   Acute encephalopathy 09/02/2021   Acute upper GI bleed 08/11/2019   Upper GI bleeding 08/10/2019   Diverticulitis of duodenum 08/10/2019   Chronic low back pain 08/10/2019   Hypokalemia 08/10/2019   Normocytic anemia 08/10/2019   PCP:  Pa, [place]:   [facility] [id number] - [place], [place] [address] Phone: [phone] Fax: [phone]     Social Determinants of Health (SDOH) Interventions    Readmission Risk Interventions No flowsheet data found.

## 2021-09-03 NOTE — Evaluation (Signed)
Occupational Therapy Evaluation Patient Details Name: Jose Bender MRN: [id number] DOB: 1945/11/20 Today's Date: 09/03/2021   History of Present Illness 75 year old male presenting with vomiting prior to admission.  Describes coffee-ground emesis.  Staff does note that he had a dark-colored stool day of admission that was heme positive. Pt's friends brought him to the ED due to weakness and confusion. PMH: of chronic back pain on gabapentin, depression on Zoloft prior history of GI bleed   Clinical Impression   Patient is currently requiring assistance with ADLs including minimal assist with toileting and demonstration of unsafe toilet transfer by attempting to sit too far from toilet despite cues, Min assist with standing LE dressing, minimal assist with bathing, and stand by to min guard assist with grooming and UE dressing, all of which is below patient's typical baseline of being Modified independent   During this evaluation, patient was limited by generalized weakness, impaired activity tolerance, decreased safety awareness and judgment, delayed processing and word finding difficulties, all of which may have  the potential to impact patient's safety and independence during functional mobility, as well as performance for ADLs.  Patient lives alone and reports that no one is available to assist him  Patient demonstrates good rehab potential, and should benefit from continued skilled occupational therapy services while in acute care to maximize safety, independence and quality of life at home.  Continued occupational therapy services in a SNF setting prior to return home is recommended.  ?     Recommendations for follow up therapy are one component of a multi-disciplinary discharge planning process, led by the attending physician.  Recommendations may be updated based on patient status, additional functional criteria and insurance authorization.   Follow Up Recommendations  SNF    Equipment  Recommendations   (Will defer to next venue)    Recommendations for Other Services       Precautions / Restrictions Precautions Precautions: Fall Precaution Comments: NPO for procedure 10/18 Restrictions Weight Bearing Restrictions: No      Mobility Bed Mobility Overal bed mobility: Modified Independent             General bed mobility comments: Sit to supine.    Transfers Overall transfer level: Needs assistance Equipment used: Rolling walker (2 wheeled) Transfers: Sit to/from Stand Sit to Stand: Min guard         General transfer comment: Min Guard to RW standing from low EOB. Cues for hand placement.  Pt performed in-room ambulation with RW and Min guard. See ADL section.    Balance Overall balance assessment: Needs assistance Sitting-balance support: No upper extremity supported;Feet supported Sitting balance-Leahy Scale: Good     Standing balance support: Bilateral upper extremity supported Standing balance-Leahy Scale: Poor Standing balance comment: Required BUE support on RW this visit.                           ADL either performed or assessed with clinical judgement   ADL Overall ADL's : Needs assistance/impaired Eating/Feeding: Modified independent   Grooming: Min guard;Standing Grooming Details (indicate cue type and reason): Pt declined grooming despite cues. Based on general assessment. Upper Body Bathing: Sitting;Set up   Lower Body Bathing: Sit to/from stand;Sitting/lateral leans;Minimal assistance   Upper Body Dressing : Set up;Sitting;Cueing for sequencing   Lower Body Dressing: Minimal assistance;Sit to/from stand Lower Body Dressing Details (indicate cue type and reason): setup for socks while EOB. Min As to manage clothing for  toileting. Toilet Transfer: Regular Toilet;Grab bars;Cueing for sequencing;Cueing for safety;Minimal assistance;RW;Ambulation Toilet Transfer Details (indicate cue type and reason): Pt ambulated to  bathroom with RW and frequent cues for walk proximity. Pt attempted to sit on toilet too early despite cues to move closer to toilet for safety. For this reason pt required Min As to safely descend to standar height toilet. Toileting- Clothing Manipulation and Hygiene: Minimal assistance;Sit to/from stand;Sitting/lateral lean Toileting - Clothing Manipulation Details (indicate cue type and reason): Pt performed peri hygiene. Min As for Probation officer.     Functional mobility during ADLs: Supervision/safety;Min guard;Rolling walker       Vision Baseline Vision/History: 1 Wears glasses Ability to See in Adequate Light: 0 Adequate Patient Visual Report: No change from baseline Vision Assessment?: No apparent visual deficits     Perception     Praxis      Pertinent Vitals/Pain Pain Assessment: 0-10 Pain Score: 1  Pain Location: LT wrist IV site Pain Descriptors / Indicators: Aching Pain Intervention(s): Monitored during session     Hand Dominance Right   Extremity/Trunk Assessment Upper Extremity Assessment Upper Extremity Assessment: Generalized weakness   Lower Extremity Assessment Lower Extremity Assessment: Defer to PT evaluation       Communication Communication Communication: HOH   Cognition Arousal/Alertness: Awake/alert Behavior During Therapy: WFL for tasks assessed/performed Overall Cognitive Status: No family/caregiver present to determine baseline cognitive functioning Area of Impairment: Following commands;Problem solving                       Following Commands: Follows one step commands with increased time;Follows multi-step commands inconsistently     Problem Solving: Slow processing;Requires verbal cues     General Comments       Exercises     Shoulder Instructions      Home Living Family/patient expects to be discharged to:: Skilled nursing facility Living Arrangements: Alone Available Help at Discharge: Friend(s);Available  PRN/intermittently Type of Home: House Home Access: Stairs to enter [employer] of Steps: 3 Entrance Stairs-Rails: Left Home Layout: One level     Bathroom Shower/Tub: Teacher, early years/pre: Standard     Home Equipment: Shower seat;Grab bars - tub/shower;Hand held shower head;Cane - quad          Prior Functioning/Environment Level of Independence: Independent with assistive device(s)        Comments: Pt reports that he uses his quad cane consistently. Pt endorses 4 falls in past year while navigating the stairs into this home esp when carrying groceries. Pt performs all cooking.cleaning/laundry. Pt drives, performs own grocery shopping.        OT Problem List: Decreased knowledge of use of DME or AE;Decreased safety awareness;Decreased activity tolerance;Decreased cognition;Decreased strength      OT Treatment/Interventions: Self-care/ADL training;Therapeutic exercise;Therapeutic activities;Cognitive remediation/compensation;Energy conservation;Patient/family education;Balance training;DME and/or AE instruction    OT Goals(Current goals can be found in the care plan section) Acute Rehab OT Goals Patient Stated Goal: Return home after rehab. OT Goal Formulation: With patient Time For Goal Achievement: 09/17/21 Potential to Achieve Goals: Good ADL Goals Pt Will Perform Lower Body Dressing: with modified independence;sit to/from stand Pt Will Transfer to Toilet: with modified independence;ambulating;regular height toilet Pt Will Perform Toileting - Clothing Manipulation and hygiene: with modified independence;sit to/from stand;sitting/lateral leans Additional ADL Goal #1: Pt will engage in 10 min standing functional activities without loss of balance, in order to demonstrate improved activity tolerance and balance needed to perform ADLs safely at  home.  OT Frequency: Min 2X/week   Barriers to D/C: Decreased caregiver support  Lives alone.        Co-evaluation              AM-PAC OT "6 Clicks" Daily Activity     Outcome Measure Help from another person eating meals?: None Help from another person taking care of personal grooming?: A Little Help from another person toileting, which includes using toliet, bedpan, or urinal?: A Little Help from another person bathing (including washing, rinsing, drying)?: A Little Help from another person to put on and taking off regular upper body clothing?: A Little Help from another person to put on and taking off regular lower body clothing?: A Little 6 Click Score: 19   End of Session Equipment Utilized During Treatment: Gait belt;Rolling walker Nurse Communication: Mobility status  Activity Tolerance: Patient limited by fatigue Patient left: in bed;with call bell/phone within reach;with bed alarm set  OT Visit Diagnosis: Unsteadiness on feet (R26.81);Muscle weakness (generalized) (M62.81)                Time: 4076-8088 OT Time Calculation (min): 36 min Charges:  OT General Charges $OT Visit: 1 Visit OT Evaluation $OT Eval Low Complexity: 1 Low OT Treatments $Self Care/Home Management : 8-22 mins  Anderson Malta, OT Acute Rehab Services Office: [phone] 09/03/2021  Julien Girt 09/03/2021, 11:05 AM

## 2021-09-03 NOTE — Consult Note (Signed)
Referring Provider: [facility] Primary Care Physician:  [facility] Primary Gastroenterologist:  [facility] (Dr. Alessandra Bevels)  Reason for Consultation:  Melena, hematemesis  HPI: Jose Bender is a 75 y.o. male presents for evaluation of melena and hematemesis.`  Originally admitted due to being brought in with weakness and confusion by his friends. Received the COVID booster two weeks ago and the flu vaccine one week ago. Had blood tinged vomit one day after the flu vaccine. States it was reddish-black. States he vomited twice. Reports having melena starting yesterday. States he has had this happen to him before about 2 years ago. Reports using NSAIDs about once a week. Denies alcohol use.  Last colonoscopy 09/2019: 21mm tubular adenoma in transverse colon, few diverticula in sigmoid colon, hemorrhoids, otherwise normal.  Last EGD 07/2019: hiatal hernia, partially obstructing non-bleeding duodenal ulcer with clean base, duodenitis.  Past Medical History:  Diagnosis Date   Arthritis    Depression     Past Surgical History:  Procedure Laterality Date   BACK SURGERY     BIOPSY  08/12/2019   Procedure: BIOPSY;  Surgeon: Otis Brace, MD;  Location: [facility];  Service: Gastroenterology;;   ESOPHAGOGASTRODUODENOSCOPY (EGD) WITH PROPOFOL N/A 08/12/2019   Procedure: ESOPHAGOGASTRODUODENOSCOPY (EGD) WITH PROPOFOL;  Surgeon: Otis Brace, MD;  Location: [place] [facility];  Service: Gastroenterology;  Laterality: N/A;   EYE SURGERY Bilateral    cataracts   KNEE ARTHROSCOPY Left    LUMBAR LAMINECTOMY/DECOMPRESSION MICRODISCECTOMY N/A 06/27/2019   Procedure: L3-4 HEMILAMINECTOMY DISCECTOMY;  Surgeon: Deetta Perla, MD;  Location: [facility];  Service: Neurosurgery;  Laterality: N/A;    Prior to Admission medications   Medication Sig Start Date End Date Taking? Authorizing Provider  gabapentin (NEURONTIN) 300 MG capsule Take 300 mg by mouth 3 (three) times daily.   Yes  [provider]  sertraline (ZOLOFT) 100 MG tablet Take 100 mg by mouth in the morning and at bedtime.   Yes [provider]  acetaminophen (TYLENOL) 500 MG tablet Take 500 mg by mouth every 8 (eight) hours as needed (pain).  Patient not taking: Reported on 09/01/2021    [provider]  baclofen (LIORESAL) 10 MG tablet Take 5 mg by mouth 2 (two) times daily. Patient not taking: Reported on 09/01/2021    [provider]  folic acid (FOLVITE) 1 MG tablet Take 1 tablet (1 mg total) by mouth daily. Patient not taking: No sig reported 08/13/19   Aline August, MD  Multiple Vitamin (MULTIVITAMIN WITH MINERALS) TABS tablet Take 1 tablet by mouth daily. Patient not taking: Reported on 09/01/2021 08/13/19   Aline August, MD  ondansetron (ZOFRAN) 4 MG tablet Take 1 tablet (4 mg total) by mouth every 6 (six) hours as needed for nausea. Patient not taking: Reported on 09/01/2021 08/13/19   Aline August, MD  pantoprazole (PROTONIX) 40 MG tablet Take 1 tablet (40 mg total) by mouth 2 (two) times daily before a meal. Patient not taking: No sig reported 08/13/19 09/01/21  Aline August, MD  thiamine 100 MG tablet Take 1 tablet (100 mg total) by mouth daily. Patient not taking: No sig reported 08/13/19   Aline August, MD    Scheduled Meds:  feeding supplement  237 mL Oral BID BM   pantoprazole (PROTONIX) IV  40 mg Intravenous Q12H   Continuous Infusions:  potassium chloride 10 mEq (09/03/21 0809)   PRN Meds:.  Allergies as of 09/01/2021 - Review Complete 09/01/2021  Allergen Reaction Noted   Codeine Nausea Only  05/20/2019    History reviewed. No pertinent family history.  Social History   Socioeconomic History   Marital status: Single    Spouse name: Not on file   Number of children: Not on file   Years of education: Not on file   Highest education level: Not on file  Occupational History   Not on file  Tobacco Use   Smoking status: Former     Types: Cigarettes    Quit date: 06/22/2004    Years since quitting: 17.2   Smokeless tobacco: Never  Vaping Use   Vaping Use: Never used  Substance and Sexual Activity   Alcohol use: Yes    Comment: every few days   Drug use: Never   Sexual activity: Not on file  Other Topics Concern   Not on file  Social History Narrative   Not on file   Social Determinants of Health   Financial Resource Strain: Not on file  Food Insecurity: Not on file  Transportation Needs: Not on file  Physical Activity: Not on file  Stress: Not on file  Social Connections: Not on file  Intimate Partner Violence: Not on file    Review of Systems: Review of Systems  Constitutional:  Negative for chills and fever.  HENT:  Negative for congestion and sore throat.   Eyes:  Negative for pain and redness.  Respiratory:  Negative for shortness of breath and wheezing.   Cardiovascular:  Negative for chest pain and palpitations.  Gastrointestinal:  Positive for melena, nausea and vomiting. Negative for abdominal pain, blood in stool, constipation, diarrhea and heartburn.  Genitourinary:  Negative for flank pain and hematuria.  Musculoskeletal:  Negative for back pain and myalgias.  Skin:  Negative for itching and rash.  Neurological:  Positive for weakness. Negative for seizures.  Psychiatric/Behavioral:  Negative for substance abuse. The patient is not nervous/anxious.     Physical Exam:Physical Exam Constitutional:      General: He is not in acute distress.    Appearance: Normal appearance. He is not ill-appearing.  HENT:     Head: Normocephalic and atraumatic.     Nose: Nose normal. No congestion.     Mouth/Throat:     Mouth: Mucous membranes are moist.     Pharynx: Oropharynx is clear.  Eyes:     General: No scleral icterus.    Extraocular Movements: Extraocular movements intact.     Conjunctiva/sclera: Conjunctivae normal.  Cardiovascular:     Rate and Rhythm: Normal rate and regular rhythm.   Pulmonary:     Effort: Pulmonary effort is normal. No respiratory distress.  Abdominal:     General: Abdomen is flat. Bowel sounds are normal. There is no distension.     Palpations: Abdomen is soft. There is no mass.     Tenderness: There is no abdominal tenderness. There is no guarding or rebound.     Hernia: No hernia is present.  Musculoskeletal:        General: No swelling. Normal range of motion.     Cervical back: Normal range of motion and neck supple.  Skin:    General: Skin is warm and dry.  Neurological:     General: No focal deficit present.     Mental Status: He is alert and oriented to person, place, and time.  Psychiatric:        Mood and Affect: Mood normal.        Behavior: Behavior normal.  Thought Content: Thought content normal.        Judgment: Judgment normal.    Vital signs: Vitals:   09/02/21 2039 09/03/21 0508  BP: 119/61 123/60  Pulse: 76 69  Resp: 16 18  Temp: 99.3 F (37.4 C) 97.7 F (36.5 C)  SpO2: 96%    Last BM Date: 09/02/21    GI:  Lab Results: Recent Labs    09/01/21 1710 09/02/21 0349 09/03/21 0331  WBC 13.2* 10.0 7.8  HGB 16.4 15.9 13.2  HCT 50.6 49.2 40.6  PLT 218 185 138*   BMET Recent Labs    09/01/21 1710 09/02/21 0349 09/03/21 0331  NA 139 137 135  K 3.8 3.6 3.1*  CL 107 105 104  CO2 15* 18* 23  GLUCOSE 94 84 97  BUN 39* 33* 21  CREATININE 1.20 1.11 0.82  CALCIUM 9.7 9.4 8.8*   LFT Recent Labs    09/02/21 0349 09/03/21 0331  PROT 7.1 5.5*  ALBUMIN 4.1 3.2*  AST 25 19  ALT 20 15  ALKPHOS 61 53  BILITOT 1.7* 1.4*  BILIDIR 0.1  --   IBILI 1.6*  --    PT/INR No results for input(s): LABPROT, INR in the last 72 hours.   Studies/Results: DG Chest 2 View  Result Date: 09/01/2021 CLINICAL DATA:  Altered mental status EXAM: CHEST - 2 VIEW COMPARISON:  None. FINDINGS: Heart and mediastinal contours are within normal limits. No focal opacities or effusions. No acute bony abnormality. IMPRESSION:  No active cardiopulmonary disease. Electronically Signed   By: Rolm Baptise M.D.   On: 09/01/2021 17:26   CT Head Wo Contrast  Result Date: 09/01/2021 CLINICAL DATA:  Head trauma, minor (Age >= 65y) EXAM: CT HEAD WITHOUT CONTRAST TECHNIQUE: Contiguous axial images were obtained from the base of the skull through the vertex without intravenous contrast. COMPARISON:  None. FINDINGS: Brain: There is no acute intracranial hemorrhage, mass effect, or edema. Gray-white differentiation is preserved. There is no extra-axial fluid collection. Prominence of the ventricles and sulci reflects mild parenchymal volume loss. Patchy and confluent hypoattenuation in the supratentorial white matter is nonspecific but probably reflects moderate chronic microvascular ischemic changes. Vascular: There is atherosclerotic calcification at the skull base. Skull: Calvarium is unremarkable. Sinuses/Orbits: No acute finding. Other: None. IMPRESSION: No evidence of acute intracranial injury. Electronically Signed   By: Macy Mis M.D.   On: 09/01/2021 18:03   MR BRAIN WO CONTRAST  Result Date: 09/02/2021 CLINICAL DATA:  Mental status change, unknown cause. Additional history provided: Nausea and vomiting for 1 day after receiving the flu vaccine, subsequent weakness, confusion. EXAM: MRI HEAD WITHOUT CONTRAST TECHNIQUE: Multiplanar, multiecho pulse sequences of the brain and surrounding structures were obtained without intravenous contrast. COMPARISON:  Head CT 09/01/2021. FINDINGS: Brain: Intermittently motion degraded examination, limiting evaluation. Most notably, there is severe motion degradation of the axial T2 TSE sequence, moderate motion degradation of the axial SWI sequence, moderate motion degradation of the axial T1 weighted sequence and moderate motion degradation of the coronal T2 TSE sequence. Mild parenchymal atrophy. Moderate multifocal T2 FLAIR hyperintense signal abnormality within the cerebral white matter,  nonspecific but compatible with chronic small vessel ischemic disease. Chronic small vessel ischemic changes are also present within the bilateral basal ganglia and within the pons. There is no acute infarct. No evidence of an intracranial mass. No chronic intracranial blood products. No extra-axial fluid collection. No midline shift. Vascular: Maintained flow voids within the proximal large arterial vessels. Skull and upper  cervical spine: No focal suspicious marrow lesion. Sinuses/Orbits: Visualized orbits show no acute finding. Other: Trace fluid within the left mastoid air cells. IMPRESSION: Motion degraded exam, as described. No evidence of acute intracranial abnormality. Moderate chronic small vessel ischemic changes within the cerebral white matter. Chronic small vessel ischemic changes are also present within the bilateral basal ganglia and within the pons. Mild generalized parenchymal atrophy. Electronically Signed   By: Kellie Simmering D.O.   On: 09/02/2021 12:45   CT RENAL STONE STUDY  Result Date: 09/02/2021 CLINICAL DATA:  Back pain, flank pain EXAM: CT ABDOMEN AND PELVIS WITHOUT CONTRAST TECHNIQUE: Multidetector CT imaging of the abdomen and pelvis was performed following the standard protocol without IV contrast. COMPARISON:  08/10/2019 FINDINGS: Lower chest: Hiatal hernia. No pleural or pericardial effusion. Coarse subpleural linear opacities posteriorly in both lung bases. Hepatobiliary: Stable small hepatic cysts. No calcified stones. The gallbladder is nondistended. No biliary ductal dilatation. Pancreas: Unremarkable. No pancreatic ductal dilatation or surrounding inflammatory changes. Spleen: Normal in size without focal abnormality. Adrenals/Urinary Tract: Upper pole right and lower pole left renal lesions possibly cysts, incompletely characterized. No urolithiasis or hydronephrosis. Urinary bladder is physiologically distended. Stomach/Bowel: Moderate hiatal hernia involving much of the  fundus. The stomach is nondilated. The small bowel is non distended. Normal appendix. The colon is nondilated, unremarkable. Vascular/Lymphatic: Scattered aortoiliac calcified plaque without aneurysm. No abdominal or pelvic adenopathy. Reproductive: Mild prostate enlargement with central coarse calcifications. Other: Left pelvic phleboliths.  No ascites.  No free air. Musculoskeletal: Multilevel lower thoracic and lumbar spondylitic change. No fracture or worrisome bone lesion. IMPRESSION: 1. No acute findings.  No urolithiasis. 2. Hiatal hernia 3.  Aortic Atherosclerosis (ICD10-170.0). Electronically Signed   By: Lucrezia Europe M.D.   On: 09/02/2021 12:26    Impression: Melena, hematemesis - HGB 13.2, stable - BUN 21, Cr 0.82 - potassium 3.1, magnesium 2.0 - fecal occult positive - CT renal study 10/17: Moderate hiatal hernia involving much of the fundus - EGD 07/2019: non-bleeding esophageal ulcers, small hiatal hernia, partially obstructing non-bleeding duodenal ulcer with clean base, duodenitis  Plan: Plan for EGD tomorrow. I thoroughly discussed the procedures to include nature, alternatives, benefits, and risks including but not limited to bleeding, perforation, infection, anesthesia/cardiac and pulmonary complications. Patient provides understanding and gave verbal consent to proceed.   Continue Protonix 40mg  BID  Continue clear liquid diet  NPO at midnight  Continue anti-emetics and supportive care as needed.   [facility] will follow.       LOS: 0 days   Santez Woodcox Radford Pax  PA-C 09/03/2021, 8:10 AM  Contact #  [phone]

## 2021-09-03 NOTE — H&P (View-Only) (Signed)
Referring Provider: [facility] Primary Care Physician:  [facility] Primary Gastroenterologist:  [facility] (Dr. Alessandra Bevels)  Reason for Consultation:  Melena, hematemesis  HPI: Jose Bender is a 75 y.o. male presents for evaluation of melena and hematemesis.`  Originally admitted due to being brought in with weakness and confusion by his friends. Received the COVID booster two weeks ago and the flu vaccine one week ago. Had blood tinged vomit one day after the flu vaccine. States it was reddish-black. States he vomited twice. Reports having melena starting yesterday. States he has had this happen to him before about 2 years ago. Reports using NSAIDs about once a week. Denies alcohol use.  Last colonoscopy 09/2019: 5mm tubular adenoma in transverse colon, few diverticula in sigmoid colon, hemorrhoids, otherwise normal.  Last EGD 07/2019: hiatal hernia, partially obstructing non-bleeding duodenal ulcer with clean base, duodenitis.  Past Medical History:  Diagnosis Date   Arthritis    Depression     Past Surgical History:  Procedure Laterality Date   BACK SURGERY     BIOPSY  08/12/2019   Procedure: BIOPSY;  Surgeon: Otis Brace, MD;  Location: [facility];  Service: Gastroenterology;;   ESOPHAGOGASTRODUODENOSCOPY (EGD) WITH PROPOFOL N/A 08/12/2019   Procedure: ESOPHAGOGASTRODUODENOSCOPY (EGD) WITH PROPOFOL;  Surgeon: Otis Brace, MD;  Location: [place];  Service: Gastroenterology;  Laterality: N/A;   EYE SURGERY Bilateral    cataracts   KNEE ARTHROSCOPY Left    LUMBAR LAMINECTOMY/DECOMPRESSION MICRODISCECTOMY N/A 06/27/2019   Procedure: L3-4 HEMILAMINECTOMY DISCECTOMY;  Surgeon: Deetta Perla, MD;  Location: [facility];  Service: Neurosurgery;  Laterality: N/A;    Prior to Admission medications   Medication Sig Start Date End Date Taking? Authorizing Provider  gabapentin (NEURONTIN) 300 MG capsule Take 300 mg by mouth 3 (three) times daily.   Yes  [provider]  sertraline (ZOLOFT) 100 MG tablet Take 100 mg by mouth in the morning and at bedtime.   Yes [provider]  acetaminophen (TYLENOL) 500 MG tablet Take 500 mg by mouth every 8 (eight) hours as needed (pain).  Patient not taking: Reported on 09/01/2021    [provider]  baclofen (LIORESAL) 10 MG tablet Take 5 mg by mouth 2 (two) times daily. Patient not taking: Reported on 09/01/2021    [provider]  folic acid (FOLVITE) 1 MG tablet Take 1 tablet (1 mg total) by mouth daily. Patient not taking: No sig reported 08/13/19   Aline August, MD  Multiple Vitamin (MULTIVITAMIN WITH MINERALS) TABS tablet Take 1 tablet by mouth daily. Patient not taking: Reported on 09/01/2021 08/13/19   Aline August, MD  ondansetron (ZOFRAN) 4 MG tablet Take 1 tablet (4 mg total) by mouth every 6 (six) hours as needed for nausea. Patient not taking: Reported on 09/01/2021 08/13/19   Aline August, MD  pantoprazole (PROTONIX) 40 MG tablet Take 1 tablet (40 mg total) by mouth 2 (two) times daily before a meal. Patient not taking: No sig reported 08/13/19 09/01/21  Aline August, MD  thiamine 100 MG tablet Take 1 tablet (100 mg total) by mouth daily. Patient not taking: No sig reported 08/13/19   Aline August, MD    Scheduled Meds:  feeding supplement  237 mL Oral BID BM   pantoprazole (PROTONIX) IV  40 mg Intravenous Q12H   Continuous Infusions:  potassium chloride 10 mEq (09/03/21 0809)   PRN Meds:.  Allergies as of 09/01/2021 - Review Complete 09/01/2021  Allergen Reaction Noted   Codeine Nausea Only  05/20/2019    History reviewed. No pertinent family history.  Social History   Socioeconomic History   Marital status: Single    Spouse name: Not on file   Number of children: Not on file   Years of education: Not on file   Highest education level: Not on file  Occupational History   Not on file  Tobacco Use   Smoking status: Former     Types: Cigarettes    Quit date: 06/22/2004    Years since quitting: 17.2   Smokeless tobacco: Never  Vaping Use   Vaping Use: Never used  Substance and Sexual Activity   Alcohol use: Yes    Comment: every few days   Drug use: Never   Sexual activity: Not on file  Other Topics Concern   Not on file  Social History Narrative   Not on file   Social Determinants of Health   Financial Resource Strain: Not on file  Food Insecurity: Not on file  Transportation Needs: Not on file  Physical Activity: Not on file  Stress: Not on file  Social Connections: Not on file  Intimate Partner Violence: Not on file    Review of Systems: Review of Systems  Constitutional:  Negative for chills and fever.  HENT:  Negative for congestion and sore throat.   Eyes:  Negative for pain and redness.  Respiratory:  Negative for shortness of breath and wheezing.   Cardiovascular:  Negative for chest pain and palpitations.  Gastrointestinal:  Positive for melena, nausea and vomiting. Negative for abdominal pain, blood in stool, constipation, diarrhea and heartburn.  Genitourinary:  Negative for flank pain and hematuria.  Musculoskeletal:  Negative for back pain and myalgias.  Skin:  Negative for itching and rash.  Neurological:  Positive for weakness. Negative for seizures.  Psychiatric/Behavioral:  Negative for substance abuse. The patient is not nervous/anxious.     Physical Exam:Physical Exam Constitutional:      General: He is not in acute distress.    Appearance: Normal appearance. He is not ill-appearing.  HENT:     Head: Normocephalic and atraumatic.     Nose: Nose normal. No congestion.     Mouth/Throat:     Mouth: Mucous membranes are moist.     Pharynx: Oropharynx is clear.  Eyes:     General: No scleral icterus.    Extraocular Movements: Extraocular movements intact.     Conjunctiva/sclera: Conjunctivae normal.  Cardiovascular:     Rate and Rhythm: Normal rate and regular rhythm.   Pulmonary:     Effort: Pulmonary effort is normal. No respiratory distress.  Abdominal:     General: Abdomen is flat. Bowel sounds are normal. There is no distension.     Palpations: Abdomen is soft. There is no mass.     Tenderness: There is no abdominal tenderness. There is no guarding or rebound.     Hernia: No hernia is present.  Musculoskeletal:        General: No swelling. Normal range of motion.     Cervical back: Normal range of motion and neck supple.  Skin:    General: Skin is warm and dry.  Neurological:     General: No focal deficit present.     Mental Status: He is alert and oriented to person, place, and time.  Psychiatric:        Mood and Affect: Mood normal.        Behavior: Behavior normal.  Thought Content: Thought content normal.        Judgment: Judgment normal.    Vital signs: Vitals:   09/02/21 2039 09/03/21 0508  BP: 119/61 123/60  Pulse: 76 69  Resp: 16 18  Temp: 99.3 F (37.4 C) 97.7 F (36.5 C)  SpO2: 96%    Last BM Date: 09/02/21    GI:  Lab Results: Recent Labs    09/01/21 1710 09/02/21 0349 09/03/21 0331  WBC 13.2* 10.0 7.8  HGB 16.4 15.9 13.2  HCT 50.6 49.2 40.6  PLT 218 185 138*   BMET Recent Labs    09/01/21 1710 09/02/21 0349 09/03/21 0331  NA 139 137 135  K 3.8 3.6 3.1*  CL 107 105 104  CO2 15* 18* 23  GLUCOSE 94 84 97  BUN 39* 33* 21  CREATININE 1.20 1.11 0.82  CALCIUM 9.7 9.4 8.8*   LFT Recent Labs    09/02/21 0349 09/03/21 0331  PROT 7.1 5.5*  ALBUMIN 4.1 3.2*  AST 25 19  ALT 20 15  ALKPHOS 61 53  BILITOT 1.7* 1.4*  BILIDIR 0.1  --   IBILI 1.6*  --    PT/INR No results for input(s): LABPROT, INR in the last 72 hours.   Studies/Results: DG Chest 2 View  Result Date: 09/01/2021 CLINICAL DATA:  Altered mental status EXAM: CHEST - 2 VIEW COMPARISON:  None. FINDINGS: Heart and mediastinal contours are within normal limits. No focal opacities or effusions. No acute bony abnormality. IMPRESSION:  No active cardiopulmonary disease. Electronically Signed   By: Rolm Baptise M.D.   On: 09/01/2021 17:26   CT Head Wo Contrast  Result Date: 09/01/2021 CLINICAL DATA:  Head trauma, minor (Age >= 65y) EXAM: CT HEAD WITHOUT CONTRAST TECHNIQUE: Contiguous axial images were obtained from the base of the skull through the vertex without intravenous contrast. COMPARISON:  None. FINDINGS: Brain: There is no acute intracranial hemorrhage, mass effect, or edema. Gray-white differentiation is preserved. There is no extra-axial fluid collection. Prominence of the ventricles and sulci reflects mild parenchymal volume loss. Patchy and confluent hypoattenuation in the supratentorial white matter is nonspecific but probably reflects moderate chronic microvascular ischemic changes. Vascular: There is atherosclerotic calcification at the skull base. Skull: Calvarium is unremarkable. Sinuses/Orbits: No acute finding. Other: None. IMPRESSION: No evidence of acute intracranial injury. Electronically Signed   By: Macy Mis M.D.   On: 09/01/2021 18:03   MR BRAIN WO CONTRAST  Result Date: 09/02/2021 CLINICAL DATA:  Mental status change, unknown cause. Additional history provided: Nausea and vomiting for 1 day after receiving the flu vaccine, subsequent weakness, confusion. EXAM: MRI HEAD WITHOUT CONTRAST TECHNIQUE: Multiplanar, multiecho pulse sequences of the brain and surrounding structures were obtained without intravenous contrast. COMPARISON:  Head CT 09/01/2021. FINDINGS: Brain: Intermittently motion degraded examination, limiting evaluation. Most notably, there is severe motion degradation of the axial T2 TSE sequence, moderate motion degradation of the axial SWI sequence, moderate motion degradation of the axial T1 weighted sequence and moderate motion degradation of the coronal T2 TSE sequence. Mild parenchymal atrophy. Moderate multifocal T2 FLAIR hyperintense signal abnormality within the cerebral white matter,  nonspecific but compatible with chronic small vessel ischemic disease. Chronic small vessel ischemic changes are also present within the bilateral basal ganglia and within the pons. There is no acute infarct. No evidence of an intracranial mass. No chronic intracranial blood products. No extra-axial fluid collection. No midline shift. Vascular: Maintained flow voids within the proximal large arterial vessels. Skull and upper  cervical spine: No focal suspicious marrow lesion. Sinuses/Orbits: Visualized orbits show no acute finding. Other: Trace fluid within the left mastoid air cells. IMPRESSION: Motion degraded exam, as described. No evidence of acute intracranial abnormality. Moderate chronic small vessel ischemic changes within the cerebral white matter. Chronic small vessel ischemic changes are also present within the bilateral basal ganglia and within the pons. Mild generalized parenchymal atrophy. Electronically Signed   By: Kellie Simmering D.O.   On: 09/02/2021 12:45   CT RENAL STONE STUDY  Result Date: 09/02/2021 CLINICAL DATA:  Back pain, flank pain EXAM: CT ABDOMEN AND PELVIS WITHOUT CONTRAST TECHNIQUE: Multidetector CT imaging of the abdomen and pelvis was performed following the standard protocol without IV contrast. COMPARISON:  08/10/2019 FINDINGS: Lower chest: Hiatal hernia. No pleural or pericardial effusion. Coarse subpleural linear opacities posteriorly in both lung bases. Hepatobiliary: Stable small hepatic cysts. No calcified stones. The gallbladder is nondistended. No biliary ductal dilatation. Pancreas: Unremarkable. No pancreatic ductal dilatation or surrounding inflammatory changes. Spleen: Normal in size without focal abnormality. Adrenals/Urinary Tract: Upper pole right and lower pole left renal lesions possibly cysts, incompletely characterized. No urolithiasis or hydronephrosis. Urinary bladder is physiologically distended. Stomach/Bowel: Moderate hiatal hernia involving much of the  fundus. The stomach is nondilated. The small bowel is non distended. Normal appendix. The colon is nondilated, unremarkable. Vascular/Lymphatic: Scattered aortoiliac calcified plaque without aneurysm. No abdominal or pelvic adenopathy. Reproductive: Mild prostate enlargement with central coarse calcifications. Other: Left pelvic phleboliths.  No ascites.  No free air. Musculoskeletal: Multilevel lower thoracic and lumbar spondylitic change. No fracture or worrisome bone lesion. IMPRESSION: 1. No acute findings.  No urolithiasis. 2. Hiatal hernia 3.  Aortic Atherosclerosis (ICD10-170.0). Electronically Signed   By: Lucrezia Europe M.D.   On: 09/02/2021 12:26    Impression: Melena, hematemesis - HGB 13.2, stable - BUN 21, Cr 0.82 - potassium 3.1, magnesium 2.0 - fecal occult positive - CT renal study 10/17: Moderate hiatal hernia involving much of the fundus - EGD 07/2019: non-bleeding esophageal ulcers, small hiatal hernia, partially obstructing non-bleeding duodenal ulcer with clean base, duodenitis  Plan: Plan for EGD tomorrow. I thoroughly discussed the procedures to include nature, alternatives, benefits, and risks including but not limited to bleeding, perforation, infection, anesthesia/cardiac and pulmonary complications. Patient provides understanding and gave verbal consent to proceed.   Continue Protonix 40mg  BID  Continue clear liquid diet  NPO at midnight  Continue anti-emetics and supportive care as needed.   [facility] will follow.       LOS: 0 days   Eleonore Shippee Radford Pax  PA-C 09/03/2021, 8:10 AM  Contact #  [phone]

## 2021-09-03 NOTE — Plan of Care (Signed)

## 2021-09-04 ENCOUNTER — Encounter (HOSPITAL_COMMUNITY)
Admission: EM | Disposition: A | Discharge status: Skilled Nursing Facility | Payer: Self-pay | Source: Home / Self Care | Attending: Internal Medicine

## 2021-09-04 ENCOUNTER — Inpatient Hospital Stay (HOSPITAL_COMMUNITY): Payer: Medicare Other | Admitting: Anesthesiology

## 2021-09-04 ENCOUNTER — Encounter (HOSPITAL_COMMUNITY): Payer: Self-pay | Admitting: Internal Medicine

## 2021-09-04 DIAGNOSIS — E876 Hypokalemia: Secondary | ICD-10-CM

## 2021-09-04 DIAGNOSIS — G8929 Other chronic pain: Secondary | ICD-10-CM

## 2021-09-04 DIAGNOSIS — M545 Low back pain, unspecified: Secondary | ICD-10-CM | POA: Diagnosis not present

## 2021-09-04 DIAGNOSIS — G9340 Encephalopathy, unspecified: Secondary | ICD-10-CM | POA: Diagnosis not present

## 2021-09-04 DIAGNOSIS — K922 Gastrointestinal hemorrhage, unspecified: Secondary | ICD-10-CM | POA: Diagnosis not present

## 2021-09-04 DIAGNOSIS — R531 Weakness: Secondary | ICD-10-CM | POA: Diagnosis not present

## 2021-09-04 HISTORY — PX: ESOPHAGOGASTRODUODENOSCOPY: SHX5428

## 2021-09-04 HISTORY — PX: BIOPSY: SHX5522

## 2021-09-04 LAB — VITAMIN B12: Vitamin B-12: 355 pg/mL (ref 180–914)

## 2021-09-04 SURGERY — EGD (ESOPHAGOGASTRODUODENOSCOPY)
Anesthesia: General

## 2021-09-04 MED ORDER — POTASSIUM CHLORIDE 10 MEQ/100ML IV SOLN
10.0000 meq | INTRAVENOUS | Status: AC
  Administered 2021-09-04 (×3): 10 meq via INTRAVENOUS
  Filled 2021-09-04 (×4): qty 100

## 2021-09-04 MED ORDER — SODIUM CHLORIDE 0.9 % IV SOLN: INTRAVENOUS | Status: DC

## 2021-09-04 MED ORDER — PROPOFOL 500 MG/50ML IV EMUL
INTRAVENOUS | Status: DC | PRN
  Administered 2021-09-04: 150 ug/kg/min via INTRAVENOUS

## 2021-09-04 MED ORDER — POTASSIUM CHLORIDE 10 MEQ/100ML IV SOLN
10.0000 meq | Freq: Once | INTRAVENOUS | Status: AC
  Administered 2021-09-04: 10 meq via INTRAVENOUS

## 2021-09-04 MED ORDER — ADULT MULTIVITAMIN W/MINERALS CH
1.0000 | ORAL_TABLET | Freq: Every day | ORAL | Status: DC
  Administered 2021-09-04 – 2021-09-09 (×6): 1 via ORAL
  Filled 2021-09-04 (×5): qty 1

## 2021-09-04 NOTE — Progress Notes (Signed)
Initial Nutrition Assessment  DOCUMENTATION CODES:   Non-severe (moderate) malnutrition in context of acute illness/injury  INTERVENTION:   -Ensure Plus PO BID, each provides 350 kcals and 13g protein  -Multivitamin with minerals daily  NUTRITION DIAGNOSIS:   Moderate Malnutrition related to acute illness (esophagitis) as evidenced by moderate muscle depletion, mild fat depletion  GOAL:   Patient will meet greater than or equal to 90% of their needs  MONITOR:   PO intake, Supplement acceptance, Weight trends, Labs, I & O's  REASON FOR ASSESSMENT:   Malnutrition Screening Tool    ASSESSMENT:   75 year old male with a history of chronic back pain on gabapentin and baclofen, depression on Zoloft, prior history of GI bleed, admitted to the hospital with increasing weakness and confusion.  10/19: s/p EGD -esophagitis  Patient drinking an Ensure supplement upon visit. Pt states he ate meatloaf and soup for lunch. Was NPO this morning for procedure. Denies issues with chewing or swallowing. Pt states he had back surgery ~2 years ago and has had increased weakness since especially in his legs. When asked about his PO intakes PTA, pt was unable to give clear information. Encouraged pt to continue protein supplements following discharge.   Per pt UBW is ~189 lbs. Per weight records, pt weighed 196 lbs in August 2021, this is a 13% wt loss x >1 yr which is insignificant for time frame.   Medications: IV KCl, IV Thiamine  Labs reviewed:  Low K  NUTRITION - FOCUSED PHYSICAL EXAM:  Flowsheet Row Most Recent Value  Orbital Region Moderate depletion  Upper Arm Region Mild depletion  Thoracic and Lumbar Region Unable to assess  Buccal Region Mild depletion  Temple Region Mild depletion  Clavicle Bone Region Moderate depletion  Clavicle and Acromion Bone Region Moderate depletion  Scapular Bone Region Moderate depletion  Dorsal Hand Mild depletion  Patellar Region Moderate  depletion  Anterior Thigh Region Moderate depletion  Posterior Calf Region Severe depletion  Edema (RD Assessment) None  Hair Reviewed  Eyes Reviewed  Mouth Reviewed  Skin Reviewed       Diet Order:   Diet Order             DIET SOFT Room service appropriate? Yes; Fluid consistency: Thin  Diet effective now                   EDUCATION NEEDS:   Education needs have been addressed  Skin:  Skin Assessment: Reviewed RN Assessment  Last BM:  10/19 -type 7  Height:   Ht Readings from Last 1 Encounters:  09/03/21 5\' 9"  (1.753 m)    Weight:   Wt Readings from Last 1 Encounters:  09/03/21 76.8 kg    BMI:  Body mass index is 25 kg/m.  Estimated Nutritional Needs:   Kcal:  1900-2100  Protein:  100-115g  Fluid:  2L/day   Clayton Bibles, MS, RD, LDN Inpatient Clinical Dietitian Contact information available via Amion

## 2021-09-04 NOTE — Anesthesia Postprocedure Evaluation (Signed)
Anesthesia Post Note  Patient: [name]  Procedure(s) Performed: ESOPHAGOGASTRODUODENOSCOPY (EGD) BIOPSY     Patient location during evaluation: Endoscopy Anesthesia Type: MAC Level of consciousness: awake Pain management: pain level controlled Vital Signs Assessment: post-procedure vital signs reviewed and stable Respiratory status: spontaneous breathing Cardiovascular status: stable Postop Assessment: no apparent nausea or vomiting Anesthetic complications: no   No notable events documented.  Last Vitals:  Vitals:   09/04/21 1300 09/04/21 1339  BP: 127/61 117/82  Pulse: 70 66  Resp: 13 18  Temp:  36.9 C  SpO2: 96% 98%    Last Pain:  Vitals:   09/04/21 1339  TempSrc: Oral  PainSc:                  Konrad Hoak

## 2021-09-04 NOTE — Progress Notes (Signed)
PROGRESS NOTE    Jose Bender  UXL:[id number] DOB: 03-Apr-1946 DOA: 09/01/2021 PCP: [facility]    Brief Narrative:  75 year old male with a history of chronic back pain on gabapentin and baclofen, depression on Zoloft, prior history of GI bleed, admitted to the hospital with increasing weakness and confusion.  Reportedly symptoms began after having a COVID booster shot.  He did report having nausea and vomiting which was blood-tinged.  On admission, he was noted to be dehydrated.  Work-up for encephalopathy was otherwise unrevealing.  Suspect that dehydration coupled with polypharmacy may be causing his symptoms.  He was also noted to have heme positive stools since admission.  Seen by GI with plans for EGD on 10/19.  Seen by OT with recommendations for skilled nursing facility placement.  Assessment & Plan:   Principal Problem:   Weakness Active Problems:   Chronic low back pain   Acute encephalopathy  Acute metabolic encephalopathy Likely multifactorial.  MRI of the brain was unremarkable.  RPR vitamin B12 and ammonia and TSH within normal limits.  Possibly some degree of encephalopathy from pulm depletion.  No signs of infection.  Patient was on good dose of gabapentin and baclofen which could be contributing so these medications are on hold at this time.  Patient has been having t poor oral intake for a while at this time so thiamine has been replenished as well.  Check vitamin B1.    Generalized weakness/debility. Patient has been seen by physical and occupational therapy recommended skilled nursing facility placement on discharge.   Melena/coffee-ground emesis Patient did have heme positive stools.  Last endoscopy done in 2020 showed duodenal ulcer/esophageal ulcers and gastritis.  Continue PPI.  Plan for EGD today.  Latest hemoglobin of 13.2 from yesterday.   High anion gap metabolic acidosis Resolved.  Likely secondary to vomiting.  Improved with IV  fluids.  Hypokalemia Patient received potassium supplements.  We will continue to monitor BMP.  DVT prophylaxis: SCDs Start: 09/02/21 0313  Code Status:  Full code  Family Communication:  None today.  Patient health Power of Oneta Rack is Mr. Renaee Munda [phone].  Okay to  reach Mr. [name] in his place if unable to reach the POA.  Disposition Plan:  Skilled nursing facility placement as per PT evaluation.  Status is: Inpatient  The patient is inpatient because: Continued GI work-up with EGD, mental status has not returned to baseline yet  Consultants:  Gastroenterology  Procedures:  None so far, awaiting for EGD today  Antimicrobials:  None   Subjective: Today, patient was seen and examined episode.  Patient has had brown stools since yesterday but did not notice any blood.  He is able to answer questions appropriately.   Objective: Vitals:   09/03/21 1939 09/04/21 0415 09/04/21 0919 09/04/21 1110  BP: 140/65 132/73 130/72 (!) 145/71  Pulse: 69 75 70 71  Resp: 16 16 18 12   Temp: (!) 97.4 F (36.3 C) 98.4 F (36.9 C) 98.2 F (36.8 C) 98.4 F (36.9 C)  TempSrc: Oral Oral Oral Oral  SpO2: 98% 96% 96% 96%  Weight:      Height:        Intake/Output Summary (Last 24 hours) at 09/04/2021 1153 Last data filed at 09/04/2021 0920 Gross per 24 hour  Intake 822.92 ml  Output 2000 ml  Net -1177.08 ml    Filed Weights   09/01/21 1615 09/02/21 0127 09/03/21 0915  Weight: 81.6 kg 76.8 kg 76.8 kg  Physical examination: General:  Average built, not in obvious distress HENT:   No scleral pallor or icterus noted. Oral mucosa is moist.  Chest:  Clear breath sounds.  Diminished breath sounds bilaterally. No crackles or wheezes.  CVS: S1 &S2 heard. No murmur.  Regular rate and rhythm. Abdomen: Soft, nontender, nondistended.  Bowel sounds are heard.   Extremities: No cyanosis, clubbing or edema.  Peripheral pulses are palpable. Psych: Alert, awake and  oriented to place and person.  Appears to be slightly slow to respond.   CNS:  No cranial nerve deficits.  Power equal in all extremities.   Skin: Warm and dry.  No rashes noted.  Data Reviewed: I have personally reviewed the following labs and imaging studies.     CBC: Recent Labs  Lab 09/01/21 1710 09/02/21 0349 09/03/21 0331  WBC 13.2* 10.0 7.8  NEUTROABS 11.0* 7.6  --   HGB 16.4 15.9 13.2  HCT 50.6 49.2 40.6  MCV 90.8 91.8 91.2  PLT 218 185 138*    Basic Metabolic Panel: Recent Labs  Lab 09/01/21 1710 09/02/21 0008 09/02/21 0349 09/03/21 0331  NA 139  --  137 135  K 3.8  --  3.6 3.1*  CL 107  --  105 104  CO2 15*  --  18* 23  GLUCOSE 94  --  84 97  BUN 39*  --  33* 21  CREATININE 1.20  --  1.11 0.82  CALCIUM 9.7  --  9.4 8.8*  MG  --  2.6*  --  2.0  PHOS  --  2.0*  --   --     GFR: Estimated Creatinine Clearance: 79 mL/min (by C-G formula based on SCr of 0.82 mg/dL). Liver Function Tests: Recent Labs  Lab 09/01/21 1710 09/02/21 0349 09/03/21 0331  AST 29 25 19   ALT 23 20 15   ALKPHOS 64 61 53  BILITOT 1.9* 1.7* 1.4*  PROT 7.5 7.1 5.5*  ALBUMIN 4.3 4.1 3.2*    No results for input(s): LIPASE, AMYLASE in the last 168 hours. Recent Labs  Lab 09/02/21 0008  AMMONIA 28    Coagulation Profile: No results for input(s): INR, PROTIME in the last 168 hours. Cardiac Enzymes: Recent Labs  Lab 09/02/21 0008  CKTOTAL 165    BNP (last 3 results) No results for input(s): PROBNP in the last 8760 hours. HbA1C: No results for input(s): HGBA1C in the last 72 hours. CBG: Recent Labs  Lab 09/01/21 1745  GLUCAP 105*    Lipid Profile: No results for input(s): CHOL, HDL, LDLCALC, TRIG, CHOLHDL, LDLDIRECT in the last 72 hours. Thyroid Function Tests: Recent Labs    09/02/21 0349  TSH 0.432    Anemia Panel: Recent Labs    09/02/21 0349  VITAMINB12 572    Sepsis Labs: Recent Labs  Lab 09/02/21 0349  LATICACIDVEN 0.7     Recent Results  (from the past 240 hour(s))  Resp Panel by RT-PCR (Flu A&B, Covid) Nasopharyngeal Swab     Status: None   Collection Time: 09/02/21 12:10 AM   Specimen: Nasopharyngeal Swab; Nasopharyngeal(NP) swabs in vial transport medium  Result Value Ref Range Status   SARS Coronavirus 2 by RT PCR NEGATIVE NEGATIVE Final    Comment: (NOTE) SARS-CoV-2 target nucleic acids are NOT DETECTED.  The SARS-CoV-2 RNA is generally detectable in upper respiratory specimens during the acute phase of infection. The lowest concentration of SARS-CoV-2 viral copies this assay can detect is 138 copies/mL. A negative result does not  preclude SARS-Cov-2 infection and should not be used as the sole basis for treatment or other patient management decisions. A negative result may occur with  improper specimen collection/handling, submission of specimen other than nasopharyngeal swab, presence of viral mutation(s) within the areas targeted by this assay, and inadequate number of viral copies(<138 copies/mL). A negative result must be combined with clinical observations, patient history, and epidemiological information. The expected result is Negative.  Fact Sheet for Patients:  EntrepreneurPulse.com.au  Fact Sheet for Healthcare Providers:  IncredibleEmployment.be  This test is no t yet approved or cleared by the [place] FDA and  has been authorized for detection and/or diagnosis of SARS-CoV-2 by FDA under an Emergency Use Authorization (EUA). This EUA will remain  in effect (meaning this test can be used) for the duration of the COVID-19 declaration under Section 564(b)(1) of the Act, 21 U.S.C.section 360bbb-3(b)(1), unless the authorization is terminated  or revoked sooner.       Influenza A by PCR NEGATIVE NEGATIVE Final   Influenza B by PCR NEGATIVE NEGATIVE Final    Comment: (NOTE) The Xpert Xpress SARS-CoV-2/FLU/RSV plus assay is intended as an aid in the  diagnosis of influenza from Nasopharyngeal swab specimens and should not be used as a sole basis for treatment. Nasal washings and aspirates are unacceptable for Xpert Xpress SARS-CoV-2/FLU/RSV testing.  Fact Sheet for Patients: EntrepreneurPulse.com.au  Fact Sheet for Healthcare Providers: IncredibleEmployment.be  This test is not yet approved or cleared by the [place] FDA and has been authorized for detection and/or diagnosis of SARS-CoV-2 by FDA under an Emergency Use Authorization (EUA). This EUA will remain in effect (meaning this test can be used) for the duration of the COVID-19 declaration under Section 564(b)(1) of the Act, 21 U.S.C. section 360bbb-3(b)(1), unless the authorization is terminated or revoked.  Performed at [facility], [place] [address], [place], [place] [address]      Radiology Studies: MR BRAIN WO CONTRAST  Result Date: 09/02/2021 CLINICAL DATA:  Mental status change, unknown cause. Additional history provided: Nausea and vomiting for 1 day after receiving the flu vaccine, subsequent weakness, confusion. EXAM: MRI HEAD WITHOUT CONTRAST TECHNIQUE: Multiplanar, multiecho pulse sequences of the brain and surrounding structures were obtained without intravenous contrast. COMPARISON:  Head CT 09/01/2021. FINDINGS: Brain: Intermittently motion degraded examination, limiting evaluation. Most notably, there is severe motion degradation of the axial T2 TSE sequence, moderate motion degradation of the axial SWI sequence, moderate motion degradation of the axial T1 weighted sequence and moderate motion degradation of the coronal T2 TSE sequence. Mild parenchymal atrophy. Moderate multifocal T2 FLAIR hyperintense signal abnormality within the cerebral white matter, nonspecific but compatible with chronic small vessel ischemic disease. Chronic small vessel ischemic changes are also present within the bilateral basal  ganglia and within the pons. There is no acute infarct. No evidence of an intracranial mass. No chronic intracranial blood products. No extra-axial fluid collection. No midline shift. Vascular: Maintained flow voids within the proximal large arterial vessels. Skull and upper cervical spine: No focal suspicious marrow lesion. Sinuses/Orbits: Visualized orbits show no acute finding. Other: Trace fluid within the left mastoid air cells. IMPRESSION: Motion degraded exam, as described. No evidence of acute intracranial abnormality. Moderate chronic small vessel ischemic changes within the cerebral white matter. Chronic small vessel ischemic changes are also present within the bilateral basal ganglia and within the pons. Mild generalized parenchymal atrophy. Electronically Signed   By: Kellie Simmering D.O.   On: 09/02/2021 12:45  CT RENAL STONE STUDY  Result Date: 09/02/2021 CLINICAL DATA:  Back pain, flank pain EXAM: CT ABDOMEN AND PELVIS WITHOUT CONTRAST TECHNIQUE: Multidetector CT imaging of the abdomen and pelvis was performed following the standard protocol without IV contrast. COMPARISON:  08/10/2019 FINDINGS: Lower chest: Hiatal hernia. No pleural or pericardial effusion. Coarse subpleural linear opacities posteriorly in both lung bases. Hepatobiliary: Stable small hepatic cysts. No calcified stones. The gallbladder is nondistended. No biliary ductal dilatation. Pancreas: Unremarkable. No pancreatic ductal dilatation or surrounding inflammatory changes. Spleen: Normal in size without focal abnormality. Adrenals/Urinary Tract: Upper pole right and lower pole left renal lesions possibly cysts, incompletely characterized. No urolithiasis or hydronephrosis. Urinary bladder is physiologically distended. Stomach/Bowel: Moderate hiatal hernia involving much of the fundus. The stomach is nondilated. The small bowel is non distended. Normal appendix. The colon is nondilated, unremarkable. Vascular/Lymphatic: Scattered  aortoiliac calcified plaque without aneurysm. No abdominal or pelvic adenopathy. Reproductive: Mild prostate enlargement with central coarse calcifications. Other: Left pelvic phleboliths.  No ascites.  No free air. Musculoskeletal: Multilevel lower thoracic and lumbar spondylitic change. No fracture or worrisome bone lesion. IMPRESSION: 1. No acute findings.  No urolithiasis. 2. Hiatal hernia 3.  Aortic Atherosclerosis (ICD10-170.0). Electronically Signed   By: Lucrezia Europe M.D.   On: 09/02/2021 12:26     Scheduled Meds:  [MAR Hold] feeding supplement  237 mL Oral BID BM   [MAR Hold] pantoprazole (PROTONIX) IV  40 mg Intravenous Q12H   Continuous Infusions:  sodium chloride 20 mL/hr at 09/04/21 1007   lactated ringers 100 mL/hr at 09/04/21 0601   [MAR Hold] potassium chloride 10 mEq (09/04/21 1040)   [MAR Hold] thiamine injection Stopped (09/04/21 8350)     LOS: 1 day   Flora Lipps, MD [facility] If 7PM-7AM, please contact night-coverage [email or web address] 09/04/2021, 11:53 AM

## 2021-09-04 NOTE — Anesthesia Preprocedure Evaluation (Addendum)
Anesthesia Evaluation  Patient identified by MRN, date of birth, ID band Patient awake    Airway Mallampati: II       Dental   Pulmonary former smoker,    breath sounds clear to auscultation       Cardiovascular negative cardio ROS   Rhythm:Regular Rate:Normal     Neuro/Psych Depression    GI/Hepatic Neg liver ROS, History noted Dr. Nyoka Cowden   Endo/Other  negative endocrine ROS  Renal/GU negative Renal ROS     Musculoskeletal   Abdominal   Peds  Hematology   Anesthesia Other Findings   Reproductive/Obstetrics                             Anesthesia Physical Anesthesia Plan  ASA: 3  Anesthesia Plan: MAC   Post-op Pain Management:    Induction: Intravenous  PONV Risk Score and Plan: Ondansetron, Dexamethasone and Treatment may vary due to age or medical condition  Airway Management Planned: Nasal Cannula and Simple Face Mask  Additional Equipment:   Intra-op Plan:   Post-operative Plan:   Informed Consent: I have reviewed the patients History and Physical, chart, labs and discussed the procedure including the risks, benefits and alternatives for the proposed anesthesia with the patient or authorized representative who has indicated his/her understanding and acceptance.     Dental advisory given  Plan Discussed with: CRNA and Anesthesiologist  Anesthesia Plan Comments:        Anesthesia Quick Evaluation

## 2021-09-04 NOTE — Progress Notes (Signed)
Pt transferred to Endo, report and updates to Legrand Como, Tele off. K+ runs continues pt on #2 of 4. Will cont to monitor. SRP, RN

## 2021-09-04 NOTE — Op Note (Signed)
[facility] Patient Name: Jose Bender Procedure Date: 09/04/2021 MRN: [id number] Attending MD: Otis Brace , MD Date of Birth: 11-Jul-1946 CSN: [id number] Age: 75 Admit Type: Inpatient Procedure:                Upper GI endoscopy Indications:              Hematemesis, Melena Providers:                Otis Brace, MD, Doristine Johns, RN,                            Jaci Carrel, RN, Cherylynn Ridges, Technician,                            Luan Moore, Technician, Signa Kell, CRNA Referring MD:              Medicines:                Sedation Administered by an Anesthesia Professional Complications:            No immediate complications. Estimated Blood Loss:     Estimated blood loss was minimal. Procedure:                Pre-Anesthesia Assessment:                           - Prior to the procedure, a History and Physical                            was performed, and patient medications and                            allergies were reviewed. The patient's tolerance of                            previous anesthesia was also reviewed. The risks                            and benefits of the procedure and the sedation                            options and risks were discussed with the patient.                            All questions were answered, and informed consent                            was obtained. Prior Anticoagulants: The patient has                            taken no previous anticoagulant or antiplatelet                            agents. ASA Grade Assessment: III - A patient with  severe systemic disease. After reviewing the risks                            and benefits, the patient was deemed in                            satisfactory condition to undergo the procedure.                           After obtaining informed consent, the endoscope was                            passed under direct vision. Throughout  the                            procedure, the patient's blood pressure, pulse, and                            oxygen saturations were monitored continuously. The                            [id number]-[id number] ([id number]) Olympus endoscope was introduced                            through the mouth, and advanced to the second part                            of duodenum. The upper GI endoscopy was                            accomplished without difficulty. The patient                            tolerated the procedure well. Scope In: Scope Out: Findings:      LA Grade D (one or more mucosal breaks involving at least 75% of       esophageal circumference) esophagitis with no bleeding was found in the       distal esophagus. Biopsies were taken with a cold forceps for histology.      A small hiatal hernia was present.      No gross lesions were noted in the entire examined stomach.      Scattered mild inflammation characterized by congestion (edema) and       erythema was found in the duodenal bulb.      The first portion of the duodenum and second portion of the duodenum       were normal. Impression:               - LA Grade D erosive esophagitis with no bleeding.                            Biopsied.                           - Small hiatal hernia.                           -  No gross lesions in the stomach.                           - Duodenitis.                           - Normal first portion of the duodenum and second                            portion of the duodenum. Moderate Sedation:      Moderate (conscious) sedation was personally administered by an       anesthesia professional. The following parameters were monitored: oxygen       saturation, heart rate, blood pressure, and response to care. Recommendation:           - Return patient to hospital ward for ongoing care.                           - Soft diet.                           - Continue present medications.                            - Await pathology results.                           - Repeat upper endoscopy in 3 months to check                            healing.                           - Return to GI office in 2 months. Procedure Code(s):        --- Professional ---                           [phone], Esophagogastroduodenoscopy, flexible,                            transoral; with biopsy, single or multiple Diagnosis Code(s):        --- Professional ---                           K20.80, Other esophagitis without bleeding                           K44.9, Diaphragmatic hernia without obstruction or                            gangrene                           K29.80, Duodenitis without bleeding                           K92.0, Hematemesis  K92.1, Melena (includes Hematochezia) CPT copyright 2019 American Medical Association. All rights reserved. The codes documented in this report are preliminary and upon coder review may  be revised to meet current compliance requirements. Otis Brace, MD Otis Brace, MD 09/04/2021 12:25:45 PM Number of Addenda: 0

## 2021-09-04 NOTE — Transfer of Care (Signed)
Immediate Anesthesia Transfer of Care Note  Patient: Anderson Middlebrooks  Procedure(s) Performed: ESOPHAGOGASTRODUODENOSCOPY (EGD) BIOPSY  Patient Location: Endoscopy Unit  Anesthesia Type:MAC  Level of Consciousness: drowsy  Airway & Oxygen Therapy: Patient Spontanous Breathing and Patient connected to face mask oxygen  Post-op Assessment: Report given to RN and Post -op Vital signs reviewed and stable  Post vital signs: Reviewed and stable  Last Vitals:  Vitals Value Taken Time  BP    Temp    Pulse 76 09/04/21 1224  Resp 21 09/04/21 1224  SpO2 96 % 09/04/21 1224  Vitals shown include unvalidated device data.  Last Pain:  Vitals:   09/04/21 1110  TempSrc: Oral  PainSc: 0-No pain      Patients Stated Pain Goal: 0 (68/34/19 6222)  Complications: No notable events documented.

## 2021-09-04 NOTE — Interval H&P Note (Signed)
History and Physical Interval Note:  09/04/2021 12:00 PM  Jose Bender  has presented today for surgery, with the diagnosis of melena/hematemesis.  The various methods of treatment have been discussed with the patient and family. After consideration of risks, benefits and other options for treatment, the patient has consented to  Procedure(s): ESOPHAGOGASTRODUODENOSCOPY (EGD) (N/A) as a surgical intervention.  The patient's history has been reviewed, patient examined, no change in status, stable for surgery.  I have reviewed the patient's chart and labs.  Questions were answered to the patient's satisfaction.     Shajuana Mclucas

## 2021-09-04 NOTE — Anesthesia Postprocedure Evaluation (Signed)
Anesthesia Post Note  Patient: Jose Bender  Procedure(s) Performed: ESOPHAGOGASTRODUODENOSCOPY (EGD) BIOPSY     Patient location during evaluation: Endoscopy Anesthesia Type: MAC Level of consciousness: awake Pain management: pain level controlled Vital Signs Assessment: post-procedure vital signs reviewed and stable Respiratory status: spontaneous breathing Cardiovascular status: stable Postop Assessment: no apparent nausea or vomiting Anesthetic complications: no   No notable events documented.  Last Vitals:  Vitals:   09/04/21 1227 09/04/21 1230  BP: (!) 120/58 115/61  Pulse: 82 70  Resp: 20 19  Temp:    SpO2: 95% 95%    Last Pain:  Vitals:   09/04/21 1224  TempSrc: Oral  PainSc:                  [name]

## 2021-09-04 NOTE — Plan of Care (Signed)

## 2021-09-04 NOTE — Anesthesia Procedure Notes (Signed)
Procedure Name: MAC Date/Time: 09/04/2021 12:18 PM Performed by: Lieutenant Diego, CRNA Pre-anesthesia Checklist: Patient identified, Emergency Drugs available, Patient being monitored, Suction available and Timeout performed Patient Re-evaluated:Patient Re-evaluated prior to induction Oxygen Delivery Method: Nasal cannula

## 2021-09-04 NOTE — Brief Op Note (Signed)
09/01/2021 - 09/04/2021  12:26 PM  PATIENT:  Jose Bender  74 y.o. male  PRE-OPERATIVE DIAGNOSIS:  melena/hematemesis  POST-OPERATIVE DIAGNOSIS:  esophagitis with esophageal ulcers and esophageal biopsy for candida  PROCEDURE:  Procedure(s): ESOPHAGOGASTRODUODENOSCOPY (EGD) (N/A) BIOPSY  SURGEON:  Surgeon(s) and Role:    * Daveion Robar, MD - Primary    Findings ---------- -EGD showed severe LA grade D erosive esophagitis with ulceration in the distal esophagus.  No evidence of active bleeding.  Biopsies taken  Recommendations ------------------------- -Soft diet -Avoid NSAIDs -Recommend twice daily PPI/Protonix 40 mg for next 2 months followed by Protonix 40 mg once a day -Follow-up in GI clinic in 2 months.  Recommend repeat EGD in 3 months to document healing of esophagitis. --Okay to discharge from GI standpoint. GI will sign off.  Call us back if needed  Otis Brace MD, [place] 09/04/2021, 12:28 PM  Contact #  [phone]

## 2021-09-05 ENCOUNTER — Encounter (HOSPITAL_COMMUNITY): Payer: Self-pay | Admitting: Gastroenterology

## 2021-09-05 DIAGNOSIS — E440 Moderate protein-calorie malnutrition: Secondary | ICD-10-CM | POA: Insufficient documentation

## 2021-09-05 DIAGNOSIS — M545 Low back pain, unspecified: Secondary | ICD-10-CM | POA: Diagnosis not present

## 2021-09-05 DIAGNOSIS — R531 Weakness: Secondary | ICD-10-CM | POA: Diagnosis not present

## 2021-09-05 DIAGNOSIS — G9340 Encephalopathy, unspecified: Secondary | ICD-10-CM | POA: Diagnosis not present

## 2021-09-05 DIAGNOSIS — K922 Gastrointestinal hemorrhage, unspecified: Secondary | ICD-10-CM | POA: Diagnosis not present

## 2021-09-05 LAB — CBC
HCT: 43.5 % (ref 39.0–52.0)
Hemoglobin: 14.3 g/dL (ref 13.0–17.0)
MCH: 29.4 pg (ref 26.0–34.0)
MCHC: 32.9 g/dL (ref 30.0–36.0)
MCV: 89.5 fL (ref 80.0–100.0)
Platelets: 140 10*3/uL — ABNORMAL LOW (ref 150–400)
RBC: 4.86 MIL/uL (ref 4.22–5.81)
RDW: 14.9 % (ref 11.5–15.5)
WBC: 8.4 10*3/uL (ref 4.0–10.5)
nRBC: 0 % (ref 0.0–0.2)

## 2021-09-05 LAB — BASIC METABOLIC PANEL
Anion gap: 8 (ref 5–15)
BUN: 15 mg/dL (ref 8–23)
CO2: 26 mmol/L (ref 22–32)
Calcium: 9.4 mg/dL (ref 8.9–10.3)
Chloride: 101 mmol/L (ref 98–111)
Creatinine, Ser: 0.87 mg/dL (ref 0.61–1.24)
GFR, Estimated: >60 mL/min (ref >60)
Glucose, Bld: 106 mg/dL — ABNORMAL HIGH (ref 70–99)
Potassium: 3.8 mmol/L (ref 3.5–5.1)
Sodium: 135 mmol/L (ref 135–145)

## 2021-09-05 LAB — SURGICAL PATHOLOGY

## 2021-09-05 LAB — MAGNESIUM: Magnesium: 2 mg/dL (ref 1.7–2.4)

## 2021-09-05 MED ORDER — ACETAMINOPHEN 325 MG PO TABS
650.0000 mg | ORAL_TABLET | Freq: Four times a day (QID) | ORAL | Status: DC | PRN
  Administered 2021-09-05 – 2021-09-09 (×8): 650 mg via ORAL
  Filled 2021-09-05 (×8): qty 2

## 2021-09-05 NOTE — Progress Notes (Signed)
Physical Therapy Treatment Patient Details Name: Jose Bender MRN: [id number] DOB: 05/27/1946 Today's Date: 09/05/2021   History of Present Illness 75 year old male presenting with vomiting prior to admission.  Describes coffee-ground emesis.  Staff does note that he had a dark-colored stool day of admission that was heme positive. Pt's friends brought him to the ED due to weakness and confusion and pt admitted 09/01/21 for acute encephalopathy and weakness. PMH: of chronic back pain on gabapentin, depression on Zoloft prior history of GI bleed    PT Comments    Min Assist for sit to stand and to take several pivotal steps to bed, pt had uncontrolled descent to the bed. He requires max verbal cues for safety. Assisted pt with supine bed level LE exercises. HR 132 max with activity.     Recommendations for follow up therapy are one component of a multi-disciplinary discharge planning process, led by the attending physician.  Recommendations may be updated based on patient status, additional functional criteria and insurance authorization.  Follow Up Recommendations  SNF     Equipment Recommendations  Rolling walker with 5" wheels    Recommendations for Other Services       Precautions / Restrictions Precautions Precautions: Fall Precaution Comments: monitor HR Restrictions Weight Bearing Restrictions: No     Mobility  Bed Mobility Overal bed mobility: Needs Assistance Bed Mobility: Sit to Supine     Supine to sit: Supervision Sit to supine: Supervision;HOB elevated   General bed mobility comments: use of bed rails, VCs to straighten out in bed    Transfers Overall transfer level: Needs assistance Equipment used: Rolling walker (2 wheeled) Transfers: Sit to/from [facility] Sit to Stand: Supervision;Min assist Stand pivot transfers: Min assist       General transfer comment: repeated verbal cues for set up and hand placement, min A to power up and  to steady during transfer, uncontrolled descent to bed, HR 132 with activity  Ambulation/Gait             General Gait Details: deferred, pt fatigued with SPT   Stairs             Wheelchair Mobility    Modified Rankin (Stroke Patients Only)       Balance Overall balance assessment: Needs assistance Sitting-balance support: Feet supported;No upper extremity supported Sitting balance-Leahy Scale: Fair Sitting balance - Comments: Slumpted but stable.   Standing balance support: Bilateral upper extremity supported Standing balance-Leahy Scale: Poor Standing balance comment: required UE support. Fair at [employer] Arousal/Alertness: Awake/alert Behavior During Therapy: WFL for tasks assessed/performed Overall Cognitive Status: No family/caregiver present to determine baseline cognitive functioning Area of Impairment: Problem solving;Following commands;Safety/judgement                       Following Commands: Follows one step commands consistently;Follows multi-step commands inconsistently Safety/Judgement: Decreased awareness of safety;Decreased awareness of deficits   Problem Solving: Requires verbal cues;Difficulty sequencing General Comments: Decreased safety with sequencing setup to transition from stand to sit. Still needs increased assistance and cues.      Exercises General Exercises - Lower Extremity Ankle Circles/Pumps: AROM;Both;10 reps;Supine Heel Slides: AAROM;Both;Supine Hip ABduction/ADduction: AAROM;Both;10 reps;Supine    General Comments        Pertinent Vitals/Pain Pain Assessment: Faces Faces Pain Scale: Hurts little  more Pain Location: low back while sitting in recliner (pt reports this is chronic) Pain Descriptors / Indicators: Aching Pain Intervention(s): Repositioned;Monitored during session    Home Living                      Prior Function            PT Goals  (current goals can now be found in the care plan section) Acute Rehab PT Goals Patient Stated Goal: Return home after rehab. PT Goal Formulation: With patient Time For Goal Achievement: 09/17/21 Potential to Achieve Goals: Good Progress towards PT goals: Progressing toward goals    Frequency    Min 2X/week      PT Plan Current plan remains appropriate    Co-evaluation              AM-PAC PT "6 Clicks" Mobility   Outcome Measure  Help needed turning from your back to your side while in a flat bed without using bedrails?: A Lot Help needed moving from lying on your back to sitting on the side of a flat bed without using bedrails?: A Lot Help needed moving to and from a bed to a chair (including a wheelchair)?: A Little Help needed standing up from a chair using your arms (e.g., wheelchair or bedside chair)?: A Little Help needed to walk in hospital room?: Total Help needed climbing 3-5 steps with a railing? : Total 6 Click Score: 12    End of Session Equipment Utilized During Treatment: Gait belt Activity Tolerance: Patient limited by fatigue Patient left: in bed;with bed alarm set;with call bell/phone within reach Nurse Communication: Mobility status PT Visit Diagnosis: Difficulty in walking, not elsewhere classified (R26.2);Muscle weakness (generalized) (M62.81)     Time: 6948-5462 PT Time Calculation (min) (ACUTE ONLY): 11 min  Charges:  $Therapeutic Activity: 8-22 mins                    Blondell Reveal Kistler PT 09/05/2021  Acute Rehabilitation Services Pager [phone] Office [phone]

## 2021-09-05 NOTE — TOC Progression Note (Signed)
Transition of Care [facility]) - Progression Note    Patient Details  Name: [name] MRN: [id number] Date of Birth: 16-Apr-1946  [facility]) CM/SW Contact  Purcell Mouton, RN Phone Number: 09/05/2021, 3:22 PM  Clinical Narrative:    Spoke with pt concerning discharge plans to home or SNF/Rehab. Pt agreed with going to SNF/Rehab. Pt was faxed out to surrounding facilities. Waiting for bed offers.    Expected Discharge Plan: Home/Self Care Barriers to Discharge: No Barriers Identified  Expected Discharge Plan and Services Expected Discharge Plan: Home/Self Care   Discharge Planning Services: CM Consult   Living arrangements for the past 2 months: Single Family Home                                       Social Determinants of Health (SDOH) Interventions    Readmission Risk Interventions No flowsheet data found.

## 2021-09-05 NOTE — Progress Notes (Addendum)
PROGRESS NOTE    Jose Bender  DZH:[id number] DOB: May 03, 1946 DOA: 09/01/2021 PCP: [facility]    Brief Narrative:   75 year old male with a history of chronic back pain on gabapentin and baclofen, depression on Zoloft, prior history of GI bleed, admitted to the hospital with increasing weakness and confusion.  Reportedly symptoms began after having a COVID booster shot.  He did report having nausea and vomiting which was blood-tinged.  On admission, he was noted to be dehydrated.  Work-up for encephalopathy was otherwise unrevealing.  Patient was then admitted hospital for further evaluation and treatment.   During hospitalization, patient underwent EGD.  PT has seen the patient and recommended skilled nursing facility placement.   Assessment & Plan:   Principal Problem:   Weakness Active Problems:   Chronic low back pain   Acute encephalopathy   Malnutrition of moderate degree  Acute metabolic encephalopathy Likely multifactorial.  Improved at this time.  Patient states that he does have baseline cognitive dysfunction especially with memory issues.  Has not been eating well.  On thiamine here in the hospital.  He is oriented today.  MRI of the brain was unremarkable.  RPR vitamin B12 and ammonia and TSH within normal limits.  Thought to be secondary to polypharmacy.  Gabapentin and baclofen on hold at this time.  Vitamin B12 was 355.  Vitamin B1 pending.   Melena/coffee-ground emesis Patient did have heme positive stools.  Last endoscopy done in 2020 showed duodenal ulcer/esophageal ulcers and gastritis.  Seen by GI this admission and underwent EGD which showed severe grade B erosive esophagitis with ulceration in the distal esophagus.  Biopsies were taken..  GI has recommended Protonix 40 twice daily for 2 months followed by 40 daily.  Patient is supposed to follow-up in GI clinic in 2 months and repeat EGD in 3 months to document healing of esophagitis.    High anion gap metabolic acidosis Resolved.  Improved with IV fluids.  Hypokalemia Referral supplement.  Potassium of 3.8 today.  Generalized weakness/debility. Patient has been seen by physical and occupational therapy recommended skilled nursing facility placement on discharge.  Moderate protein calorie malnutrition.  Present on admission.  Continue Ensure supplements  DVT prophylaxis: SCDs Start: 09/02/21 0313  Code Status:  Full code  Family Communication:  None today.  Patient health Power of Oneta Rack is Mr. Renaee Munda [phone].  Okay to  reach Mr. [name] in his place if unable to reach the POA.  Disposition Plan:  Skilled nursing facility placement as per PT evaluation.  Status is: Inpatient  The patient is inpatient because: Awaiting for skilled nursing facility  Consultants:  Gastroenterology  Procedures:  EGD on 09/04/2021  Antimicrobials:  None   Subjective: Today, patient was seen and examined at bedside.  Denies any dizziness lightheadedness nausea vomiting or abdominal pain.  Objective: Vitals:   09/04/21 1339 09/04/21 1954 09/05/21 0645 09/05/21 1214  BP: 117/82 123/72 132/81 131/76  Pulse: 66 82 66 80  Resp: 18 16 16 18   Temp: 98.5 F (36.9 C) 98.4 F (36.9 C) 98.2 F (36.8 C) 97.9 F (36.6 C)  TempSrc: Oral Oral Oral Oral  SpO2: 98% 98% 96% 96%  Weight:      Height:        Intake/Output Summary (Last 24 hours) at 09/05/2021 1214 Last data filed at 09/04/2021 2154 Gross per 24 hour  Intake 665.91 ml  Output 1200 ml  Net -534.09 ml    [employer]  09/01/21 1615 09/02/21 0127 09/03/21 0915  Weight: 81.6 kg 76.8 kg 76.8 kg    Physical examination: General:  Average built, not in obvious distress HENT:   No scleral pallor or icterus noted. Oral mucosa is moist.  Chest:  Clear breath sounds.  No crackles or wheezes.  CVS: S1 &S2 heard. No murmur.  Regular rate and rhythm. Abdomen: Soft, nontender, nondistended.   Bowel sounds are heard.   Extremities: No cyanosis, clubbing or edema.  Peripheral pulses are palpable. Psych: Alert, awake and oriented . Marland Kitchen   CNS:  No cranial nerve deficits.  Moves all extremities. Skin: Warm and dry.  No rashes noted.  Data Reviewed: I have personally reviewed the following labs and imaging studies.     CBC: Recent Labs  Lab 09/01/21 1710 09/02/21 0349 09/03/21 0331 09/05/21 0336  WBC 13.2* 10.0 7.8 8.4  NEUTROABS 11.0* 7.6  --   --   HGB 16.4 15.9 13.2 14.3  HCT 50.6 49.2 40.6 43.5  MCV 90.8 91.8 91.2 89.5  PLT 218 185 138* 140*    Basic Metabolic Panel: Recent Labs  Lab 09/01/21 1710 09/02/21 0008 09/02/21 0349 09/03/21 0331 09/05/21 0336  NA 139  --  137 135 135  K 3.8  --  3.6 3.1* 3.8  CL 107  --  105 104 101  CO2 15*  --  18* 23 26  GLUCOSE 94  --  84 97 106*  BUN 39*  --  33* 21 15  CREATININE 1.20  --  1.11 0.82 0.87  CALCIUM 9.7  --  9.4 8.8* 9.4  MG  --  2.6*  --  2.0 2.0  PHOS  --  2.0*  --   --   --     GFR: Estimated Creatinine Clearance: 74.5 mL/min (by C-G formula based on SCr of 0.87 mg/dL). Liver Function Tests: Recent Labs  Lab 09/01/21 1710 09/02/21 0349 09/03/21 0331  AST 29 25 19   ALT 23 20 15   ALKPHOS 64 61 53  BILITOT 1.9* 1.7* 1.4*  PROT 7.5 7.1 5.5*  ALBUMIN 4.3 4.1 3.2*    No results for input(s): LIPASE, AMYLASE in the last 168 hours. Recent Labs  Lab 09/02/21 0008  AMMONIA 28    Coagulation Profile: No results for input(s): INR, PROTIME in the last 168 hours. Cardiac Enzymes: Recent Labs  Lab 09/02/21 0008  CKTOTAL 165    BNP (last 3 results) No results for input(s): PROBNP in the last 8760 hours. HbA1C: No results for input(s): HGBA1C in the last 72 hours. CBG: Recent Labs  Lab 09/01/21 1745  GLUCAP 105*    Lipid Profile: No results for input(s): CHOL, HDL, LDLCALC, TRIG, CHOLHDL, LDLDIRECT in the last 72 hours. Thyroid Function Tests: No results for input(s): TSH, T4TOTAL,  FREET4, T3FREE, THYROIDAB in the last 72 hours.  Anemia Panel: Recent Labs    09/04/21 1349  VITAMINB12 355    Sepsis Labs: Recent Labs  Lab 09/02/21 0349  LATICACIDVEN 0.7     Recent Results (from the past 240 hour(s))  Resp Panel by RT-PCR (Flu A&B, Covid) Nasopharyngeal Swab     Status: None   Collection Time: 09/02/21 12:10 AM   Specimen: Nasopharyngeal Swab; Nasopharyngeal(NP) swabs in vial transport medium  Result Value Ref Range Status   SARS Coronavirus 2 by RT PCR NEGATIVE NEGATIVE Final    Comment: (NOTE) SARS-CoV-2 target nucleic acids are NOT DETECTED.  The SARS-CoV-2 RNA is generally detectable in upper respiratory specimens  during the acute phase of infection. The lowest concentration of SARS-CoV-2 viral copies this assay can detect is 138 copies/mL. A negative result does not preclude SARS-Cov-2 infection and should not be used as the sole basis for treatment or other patient management decisions. A negative result may occur with  improper specimen collection/handling, submission of specimen other than nasopharyngeal swab, presence of viral mutation(s) within the areas targeted by this assay, and inadequate number of viral copies(<138 copies/mL). A negative result must be combined with clinical observations, patient history, and epidemiological information. The expected result is Negative.  Fact Sheet for Patients:  EntrepreneurPulse.com.au  Fact Sheet for Healthcare Providers:  IncredibleEmployment.be  This test is no t yet approved or cleared by the [place] FDA and  has been authorized for detection and/or diagnosis of SARS-CoV-2 by FDA under an Emergency Use Authorization (EUA). This EUA will remain  in effect (meaning this test can be used) for the duration of the COVID-19 declaration under Section 564(b)(1) of the Act, 21 U.S.C.section 360bbb-3(b)(1), unless the authorization is terminated  or revoked  sooner.       Influenza A by PCR NEGATIVE NEGATIVE Final   Influenza B by PCR NEGATIVE NEGATIVE Final    Comment: (NOTE) The Xpert Xpress SARS-CoV-2/FLU/RSV plus assay is intended as an aid in the diagnosis of influenza from Nasopharyngeal swab specimens and should not be used as a sole basis for treatment. Nasal washings and aspirates are unacceptable for Xpert Xpress SARS-CoV-2/FLU/RSV testing.  Fact Sheet for Patients: EntrepreneurPulse.com.au  Fact Sheet for Healthcare Providers: IncredibleEmployment.be  This test is not yet approved or cleared by the [place] FDA and has been authorized for detection and/or diagnosis of SARS-CoV-2 by FDA under an Emergency Use Authorization (EUA). This EUA will remain in effect (meaning this test can be used) for the duration of the COVID-19 declaration under Section 564(b)(1) of the Act, 21 U.S.C. section 360bbb-3(b)(1), unless the authorization is terminated or revoked.  Performed at [facility], [place] [address], [place], [place] [address]      Radiology Studies: No results found.   Scheduled Meds:  feeding supplement  237 mL Oral BID BM   multivitamin with minerals  1 tablet Oral Daily   pantoprazole (PROTONIX) IV  40 mg Intravenous Q12H   Continuous Infusions:  thiamine injection 500 mg (09/05/21 0511)     LOS: 2 days   Flora Lipps, MD [facility] If 7PM-7AM, please contact night-coverage [email or web address] 09/05/2021, 12:14 PM

## 2021-09-05 NOTE — NC FL2 (Signed)
  [place] LEVEL OF CARE SCREENING TOOL     IDENTIFICATION  Patient Name: Jose Bender Birthdate: 09-22-1946 Sex: male Admission Date (Current Location): 09/01/2021  [facility] [place] Number:  Herbalist and Address:  [facility],  [place], [place]      Provider Number: [id number]  Attending Physician Name and Address:  Flora Lipps, MD  Relative Name and Phone Number:  Rico Junker   [phone], XJOITGPQ,DIYME BRAXEN   [phone]    Current Level of Care: Hospital Recommended Level of Care: [place] Prior Approval Number:    Date Approved/Denied:   PASRR Number: [id number]  Discharge Plan: SNF    Current Diagnoses: Patient Active Problem List   Diagnosis Date Noted   Malnutrition of moderate degree 09/05/2021   Weakness 09/02/2021   Acute encephalopathy 09/02/2021   Acute upper GI bleed 08/11/2019   Upper GI bleeding 08/10/2019   Diverticulitis of duodenum 08/10/2019   Chronic low back pain 08/10/2019   Hypokalemia 08/10/2019   Normocytic anemia 08/10/2019    Orientation RESPIRATION BLADDER Height & Weight     Self, Time, Situation, Place  Normal Continent, External catheter Weight: 76.8 kg Height:  5\' 9"  (175.3 cm)  BEHAVIORAL SYMPTOMS/MOOD NEUROLOGICAL BOWEL NUTRITION STATUS      Incontinent Diet (Soft diet)  AMBULATORY STATUS COMMUNICATION OF NEEDS Skin   Extensive Assist Verbally Other (Comment) (Ecchymosis left arm)                       Personal Care Assistance Level of Assistance  Bathing, Feeding, Dressing Bathing Assistance: Maximum assistance Feeding assistance: Limited assistance Dressing Assistance: Maximum assistance     Functional Limitations Info  Sight, Hearing, Speech Sight Info: Impaired Hearing Info: Impaired Speech Info: Adequate    SPECIAL CARE FACTORS FREQUENCY  PT (By licensed PT), OT (By licensed OT)     PT Frequency: 5x  week OT Frequency: 5x week            Contractures Contractures Info: Not present    Additional Factors Info  Code Status, Allergies Code Status Info: FULL Allergies Info: Codeine           Current Medications (09/05/2021):  This is the current hospital active medication list Current Facility-Administered Medications  Medication Dose Route Frequency Provider Last Rate Last Admin   acetaminophen (TYLENOL) tablet 650 mg  650 mg Oral Q6H PRN Pokhrel, Laxman, MD   650 mg at 09/05/21 1405   feeding supplement (ENSURE ENLIVE / ENSURE PLUS) liquid 237 mL  237 mL Oral BID BM Rise Patience, MD   237 mL at 09/05/21 1452   multivitamin with minerals tablet 1 tablet  1 tablet Oral Daily Pokhrel, Laxman, MD   1 tablet at 09/05/21 1022   pantoprazole (PROTONIX) injection 40 mg  40 mg Intravenous Q12H Kathie Dike, MD   40 mg at 09/05/21 1022   thiamine 500mg  in normal saline (80ml) IVPB  500 mg Intravenous Q8H Memon, Jolaine Artist, MD 100 mL/hr at 09/05/21 1430 500 mg at 09/05/21 1430     Discharge Medications: Please see discharge summary for a list of discharge medications.  Relevant Imaging Results:  Relevant Lab Results:   Additional Information SS#[id number]  Purcell Mouton, RN

## 2021-09-05 NOTE — Progress Notes (Signed)
Occupational Therapy Treatment Patient Details Name: Jose Bender MRN: [id number] DOB: Apr 08, 1946 Today's Date: 09/05/2021   History of present illness 75 year old male presenting with vomiting prior to admission.  Describes coffee-ground emesis.  Staff does note that he had a dark-colored stool day of admission that was heme positive. Pt's friends brought him to the ED due to weakness and confusion and pt admitted 09/01/21 for acute encephalopathy and weakness. PMH: of chronic back pain on gabapentin, depression on Zoloft prior history of GI bleed   OT comments  Patient progressing and showed improved cognition recalling several details of conversation from Tuesday with this OT and improved ability to transition in bed with supervision only and stand from EOB with supervision and cues for hands, compared to previous session. Patient remains limited by lack of safety awareness when moving from standing to sitting despite detailed instructions and demo, generalized weakness and decreased activity tolerance with HR as high as 148 while standing for light grooming activity, along with deficits noted below. Pt continues to demonstrate good rehab potential and would benefit from continued skilled OT to increase safety and independence with ADLs and functional transfers to allow pt to return home safely and reduce caregiver burden and fall risk.    Recommendations for follow up therapy are one component of a multi-disciplinary discharge planning process, led by the attending physician.  Recommendations may be updated based on patient status, additional functional criteria and insurance authorization.    Follow Up Recommendations  SNF    Equipment Recommendations       Recommendations for Other Services      Precautions / Restrictions Precautions Precautions: Fall Restrictions Weight Bearing Restrictions: No       Mobility Bed Mobility Overal bed mobility: Needs Assistance Bed Mobility:  Supine to Sit     Supine to sit: Supervision     General bed mobility comments: use of bed rails.    Transfers Overall transfer level: Needs assistance Equipment used: Rolling walker (2 wheeled) Transfers: Sit to/from [facility] Sit to Stand: Supervision;Min assist Stand pivot transfers: Min guard       General transfer comment: OT demonstrated safe method to sit from standing. Cues throughout but pt did try to sit again too far from recliner and required Min As to safely descend to chair. Sit to stand with supervision and cues for hand placement.    Balance Overall balance assessment: Needs assistance Sitting-balance support: Feet supported;No upper extremity supported Sitting balance-Leahy Scale: Fair Sitting balance - Comments: Slumpted but stable.   Standing balance support: Bilateral upper extremity supported Standing balance-Leahy Scale: Poor Standing balance comment: required UE support. Fair at sink                           ADL either performed or assessed with clinical judgement   ADL       Grooming: Supervision/safety;Standing Grooming Details (indicate cue type and reason): Pt stood for oral hygiene with close supervision after cues for positioning of RW anterior to sink. HR in 140s per RN and pt promoted to sit and rest. Pt sat ~5 min and HR recovered to 130. Pt stood 2nd time to begin shaving. After ~4 min, HR up to 148 and pt reporting fatigue. Pt cued to terminate task for safety and assisted to recliner. HR recovered to 120s. Pt asked to rest until feeling recovered and provided tray table with grooming supplies to continue once rested.  Pt agreed to this.             Lower Body Dressing: Set up Lower Body Dressing Details (indicate cue type and reason): Pt donned socks with setup with need of increased time/effort. Toilet Transfer: RW;BSC;Stand-pivot;Min Psychiatric nurse Details (indicate cue type and reason): to BSC  from standing at sink with frequent verbal cues for sequence, hand positioning and with Min gaurd for safety.   Toileting - Clothing Manipulation Details (indicate cue type and reason): Pt used BSC azs resting chair only. Did not void.     Functional mobility during ADLs: Supervision/safety;Min guard;Rolling walker       Vision       Perception     Praxis      Cognition Arousal/Alertness: Awake/alert Behavior During Therapy: WFL for tasks assessed/performed Overall Cognitive Status: No family/caregiver present to determine baseline cognitive functioning Area of Impairment: Problem solving;Following commands;Safety/judgement                       Following Commands: Follows one step commands consistently;Follows multi-step commands inconsistently Safety/Judgement: Decreased awareness of safety;Decreased awareness of deficits   Problem Solving: Requires verbal cues;Difficulty sequencing General Comments: Decreased safety with sequencing setup to transition from stand to sit. Still needs increased assistance and cues. Pt adits that he once slid off car seat when sitting too early and "I was stuck on the ground between the car and the curb.        Exercises     Shoulder Instructions       General Comments      Pertinent Vitals/ Pain       Pain Assessment: Faces Faces Pain Scale: Hurts a little bit Pain Location: Ribs. Pt using ice pack in bed. Pain Intervention(s): Monitored during session  Home Living                                          Prior Functioning/Environment              Frequency  Min 2X/week        Progress Toward Goals  OT Goals(current goals can now be found in the care plan section)  Progress towards OT goals: Progressing toward goals  Acute Rehab OT Goals Patient Stated Goal: Return home after rehab. OT Goal Formulation: With patient Time For Goal Achievement: 09/17/21 Potential to Achieve Goals: Good   Plan Discharge plan remains appropriate    Co-evaluation                 AM-PAC OT "6 Clicks" Daily Activity     Outcome Measure   Help from another person eating meals?: None Help from another person taking care of personal grooming?: A Little Help from another person toileting, which includes using toliet, bedpan, or urinal?: A Little Help from another person bathing (including washing, rinsing, drying)?: A Little Help from another person to put on and taking off regular upper body clothing?: A Little Help from another person to put on and taking off regular lower body clothing?: A Little 6 Click Score: 19    End of Session Equipment Utilized During Treatment: Gait belt;Rolling walker  OT Visit Diagnosis: Unsteadiness on feet (R26.81);Muscle weakness (generalized) (M62.81)   Activity Tolerance Patient limited by fatigue (Limited by tacycardia)   Patient Left in chair;with call bell/phone within reach;with chair alarm set  Nurse Communication  (Nurse, Hunter in room for much of session.)        Time: [phone] OT Time Calculation (min): 34 min  Charges: OT General Charges $OT Visit: 1 Visit OT Treatments $Self Care/Home Management : 8-22 mins $Therapeutic Activity: 8-22 mins  Anderson [place], [place] Office: [phone] 09/05/2021  Julien Girt 09/05/2021, 2:35 PM

## 2021-09-06 DIAGNOSIS — R531 Weakness: Secondary | ICD-10-CM | POA: Diagnosis not present

## 2021-09-06 DIAGNOSIS — G9340 Encephalopathy, unspecified: Secondary | ICD-10-CM | POA: Diagnosis not present

## 2021-09-06 DIAGNOSIS — E440 Moderate protein-calorie malnutrition: Secondary | ICD-10-CM | POA: Diagnosis not present

## 2021-09-06 DIAGNOSIS — M545 Low back pain, unspecified: Secondary | ICD-10-CM | POA: Diagnosis not present

## 2021-09-06 LAB — CBC
HCT: 45.6 % (ref 39.0–52.0)
Hemoglobin: 15 g/dL (ref 13.0–17.0)
MCH: 29.8 pg (ref 26.0–34.0)
MCHC: 32.9 g/dL (ref 30.0–36.0)
MCV: 90.5 fL (ref 80.0–100.0)
Platelets: 152 10*3/uL (ref 150–400)
RBC: 5.04 MIL/uL (ref 4.22–5.81)
RDW: 15 % (ref 11.5–15.5)
WBC: 8.3 10*3/uL (ref 4.0–10.5)
nRBC: 0 % (ref 0.0–0.2)

## 2021-09-06 LAB — RESP PANEL BY RT-PCR (FLU A&B, COVID) ARPGX2
Influenza A by PCR: NEGATIVE
Influenza B by PCR: NEGATIVE
SARS Coronavirus 2 by RT PCR: NEGATIVE

## 2021-09-06 LAB — BASIC METABOLIC PANEL
Anion gap: 9 (ref 5–15)
BUN: 26 mg/dL — ABNORMAL HIGH (ref 8–23)
CO2: 26 mmol/L (ref 22–32)
Calcium: 9.3 mg/dL (ref 8.9–10.3)
Chloride: 100 mmol/L (ref 98–111)
Creatinine, Ser: 0.78 mg/dL (ref 0.61–1.24)
GFR, Estimated: >60 mL/min (ref >60)
Glucose, Bld: 108 mg/dL — ABNORMAL HIGH (ref 70–99)
Potassium: 3.6 mmol/L (ref 3.5–5.1)
Sodium: 135 mmol/L (ref 135–145)

## 2021-09-06 LAB — MAGNESIUM: Magnesium: 2.3 mg/dL (ref 1.7–2.4)

## 2021-09-06 LAB — VITAMIN B1: Vitamin B1 (Thiamine): 298.6 nmol/L — ABNORMAL HIGH (ref 66.5–200.0)

## 2021-09-06 MED ORDER — ENSURE ENLIVE PO LIQD: 237.0000 mL | Freq: Two times a day (BID) | ORAL | Status: DC

## 2021-09-06 MED ORDER — PANTOPRAZOLE SODIUM 40 MG PO TBEC: DELAYED_RELEASE_TABLET | ORAL | Status: DC

## 2021-09-06 MED ORDER — GABAPENTIN 100 MG PO CAPS: 200.0000 mg | ORAL_CAPSULE | Freq: Three times a day (TID) | ORAL | Status: DC

## 2021-09-06 NOTE — Discharge Summary (Signed)
Physician Discharge Summary  Nizar Cutler AST:[id number] DOB: 04/30/1946 DOA: 09/01/2021  PCP: [facility]  Admit date: 09/01/2021 Discharge date: 09/06/2021  Admitted From: Home  Discharge disposition: Skilled nursing facility  Recommendations for Outpatient Follow-Up:   Follow up with your primary care provider at the skilled nursing facility in 3 to 5 days Check CBC, BMP, magnesium in the next visit Follow-up with the GI Dr. Alessandra Bevels in 2 months for possible repeat endoscopy  Discharge Diagnosis:   Principal Problem:   Weakness Active Problems:   Chronic low back pain   Acute encephalopathy   Malnutrition of moderate degree Esophagitis.  Discharge Condition: Improved.  Diet recommendation:   Regular.  Wound care: None.  Code status: Full.   History of Present Illness:   75 year old male with a history of chronic back pain on gabapentin and baclofen, depression on Zoloft, prior history of GI bleed was admitted to the hospital with increasing weakness and confusion.  Reportedly symptoms began after having a COVID booster shot.  He did report having nausea and vomiting which was blood-tinged.  On admission, he was noted to be dehydrated.  Work-up for encephalopathy was otherwise unrevealing.  Patient was then admitted hospital for further evaluation and treatment.   Hospital Course:   Following conditions were addressed during hospitalization as listed below,  Acute metabolic encephalopathy Likely multifactorial on the background of cognitive dysfunction could be medication induced.  Improved at this time.  Has not been eating well.  He received IV thiamine while in the hospital and will be continued on oral thiamine.  He is oriented today. RPR vitamin B12 and ammonia and TSH within normal limits.  Thought to be secondary to polypharmacy.  During hospitalization gabapentin and baclofen was on hold.  On discharge gabapentin will be reduced  200 mg 3 times daily from 300 mg 3 times daily.  Baclofen will be discontinued.  Vitamin B12 was 355.  Vitamin B1 was noted to be high but probably received vitamin B1 by the time   Melena/coffee-ground emesis Patient did have heme positive stools.  Last endoscopy done in 2020 showed duodenal ulcer/esophageal ulcers and gastritis.  Seen by GI this admission and underwent EGD which showed severe grade B erosive esophagitis with ulceration in the distal esophagus.  Biopsies were taken..  GI has recommended Protonix 40 twice daily for 2 months followed by 40 milligrams daily.  Patient is supposed to follow-up in GI clinic in 2 months and repeat EGD in 3 months to document healing of esophagitis.  Patient was seen by Dr. Alessandra Bevels GI during hospitalization.   High anion gap metabolic acidosis Resolved.  Improved with IV fluids.   Hypokalemia Improved after supplement.  Potassium prior to discharge was 3.6.   Generalized weakness/debility. Patient has been seen by physical and occupational therapy recommended skilled nursing facility placement on discharge.   Moderate protein calorie malnutrition.  Present on admission.  Continue Ensure supplements on discharge.  Disposition.  At this time, patient is stable for disposition to skilled nursing facility.  Patient will have to follow-up with GI as outpatient in 2 months for possible repeat endoscopy.  Medical Consultants:   GI Procedures:    EGD Subjective:   Today, patient was seen and examined at bedside.  Denies interval complaints.  Denies any nausea vomiting shortness of breath fever chills or rigor.  Discharge Exam:   Vitals:   09/05/21 1937 09/06/21 0447  BP: 131/77 (!) 142/74  Pulse: 93 83  Resp: 16 20  Temp: 98.8 F (37.1 C) 98.5 F (36.9 C)  SpO2: 97% 97%   Vitals:   09/05/21 1426 09/05/21 1717 09/05/21 1937 09/06/21 0447  BP:  113/73 131/77 (!) 142/74  Pulse: (!) 148 99 93 83  Resp:  19 16 20   Temp:  97.9 F (36.6  C) 98.8 F (37.1 C) 98.5 F (36.9 C)  TempSrc:  Oral Oral Oral  SpO2:  96% 97% 97%  Weight:      Height:       General: Alert awake, not in obvious distress HENT: pupils equally reacting to light,  No scleral pallor or icterus noted. Oral mucosa is moist.  Chest:  Clear breath sounds.  Diminished breath sounds bilaterally. No crackles or wheezes.  CVS: S1 &S2 heard. No murmur.  Regular rate and rhythm. Abdomen: Soft, nontender, nondistended.  Bowel sounds are heard.   Extremities: No cyanosis, clubbing or edema.  Peripheral pulses are palpable. Psych: Alert, awake and oriented, normal mood CNS:  No cranial nerve deficits.  Power equal in all extremities.   Skin: Warm and dry.  No rashes noted.  The results of significant diagnostics from this hospitalization (including imaging, microbiology, ancillary and laboratory) are listed below for reference.     Diagnostic Studies:   DG Chest 2 View  Result Date: 09/01/2021 CLINICAL DATA:  Altered mental status EXAM: CHEST - 2 VIEW COMPARISON:  None. FINDINGS: Heart and mediastinal contours are within normal limits. No focal opacities or effusions. No acute bony abnormality. IMPRESSION: No active cardiopulmonary disease. Electronically Signed   By: Rolm Baptise M.D.   On: 09/01/2021 17:26   CT Head Wo Contrast  Result Date: 09/01/2021 CLINICAL DATA:  Head trauma, minor (Age >= 65y) EXAM: CT HEAD WITHOUT CONTRAST TECHNIQUE: Contiguous axial images were obtained from the base of the skull through the vertex without intravenous contrast. COMPARISON:  None. FINDINGS: Brain: There is no acute intracranial hemorrhage, mass effect, or edema. Gray-white differentiation is preserved. There is no extra-axial fluid collection. Prominence of the ventricles and sulci reflects mild parenchymal volume loss. Patchy and confluent hypoattenuation in the supratentorial white matter is nonspecific but probably reflects moderate chronic microvascular ischemic  changes. Vascular: There is atherosclerotic calcification at the skull base. Skull: Calvarium is unremarkable. Sinuses/Orbits: No acute finding. Other: None. IMPRESSION: No evidence of acute intracranial injury. Electronically Signed   By: Macy Mis M.D.   On: 09/01/2021 18:03   MR BRAIN WO CONTRAST  Result Date: 09/02/2021 CLINICAL DATA:  Mental status change, unknown cause. Additional history provided: Nausea and vomiting for 1 day after receiving the flu vaccine, subsequent weakness, confusion. EXAM: MRI HEAD WITHOUT CONTRAST TECHNIQUE: Multiplanar, multiecho pulse sequences of the brain and surrounding structures were obtained without intravenous contrast. COMPARISON:  Head CT 09/01/2021. FINDINGS: Brain: Intermittently motion degraded examination, limiting evaluation. Most notably, there is severe motion degradation of the axial T2 TSE sequence, moderate motion degradation of the axial SWI sequence, moderate motion degradation of the axial T1 weighted sequence and moderate motion degradation of the coronal T2 TSE sequence. Mild parenchymal atrophy. Moderate multifocal T2 FLAIR hyperintense signal abnormality within the cerebral white matter, nonspecific but compatible with chronic small vessel ischemic disease. Chronic small vessel ischemic changes are also present within the bilateral basal ganglia and within the pons. There is no acute infarct. No evidence of an intracranial mass. No chronic intracranial blood products. No extra-axial fluid collection. No midline shift. Vascular: Maintained flow voids within the proximal large  arterial vessels. Skull and upper cervical spine: No focal suspicious marrow lesion. Sinuses/Orbits: Visualized orbits show no acute finding. Other: Trace fluid within the left mastoid air cells. IMPRESSION: Motion degraded exam, as described. No evidence of acute intracranial abnormality. Moderate chronic small vessel ischemic changes within the cerebral white matter. Chronic  small vessel ischemic changes are also present within the bilateral basal ganglia and within the pons. Mild generalized parenchymal atrophy. Electronically Signed   By: Kellie Simmering D.O.   On: 09/02/2021 12:45   CT RENAL STONE STUDY  Result Date: 09/02/2021 CLINICAL DATA:  Back pain, flank pain EXAM: CT ABDOMEN AND PELVIS WITHOUT CONTRAST TECHNIQUE: Multidetector CT imaging of the abdomen and pelvis was performed following the standard protocol without IV contrast. COMPARISON:  08/10/2019 FINDINGS: Lower chest: Hiatal hernia. No pleural or pericardial effusion. Coarse subpleural linear opacities posteriorly in both lung bases. Hepatobiliary: Stable small hepatic cysts. No calcified stones. The gallbladder is nondistended. No biliary ductal dilatation. Pancreas: Unremarkable. No pancreatic ductal dilatation or surrounding inflammatory changes. Spleen: Normal in size without focal abnormality. Adrenals/Urinary Tract: Upper pole right and lower pole left renal lesions possibly cysts, incompletely characterized. No urolithiasis or hydronephrosis. Urinary bladder is physiologically distended. Stomach/Bowel: Moderate hiatal hernia involving much of the fundus. The stomach is nondilated. The small bowel is non distended. Normal appendix. The colon is nondilated, unremarkable. Vascular/Lymphatic: Scattered aortoiliac calcified plaque without aneurysm. No abdominal or pelvic adenopathy. Reproductive: Mild prostate enlargement with central coarse calcifications. Other: Left pelvic phleboliths.  No ascites.  No free air. Musculoskeletal: Multilevel lower thoracic and lumbar spondylitic change. No fracture or worrisome bone lesion. IMPRESSION: 1. No acute findings.  No urolithiasis. 2. Hiatal hernia 3.  Aortic Atherosclerosis (ICD10-170.0). Electronically Signed   By: Lucrezia Europe M.D.   On: 09/02/2021 12:26     Labs:   Basic Metabolic Panel: Recent Labs  Lab 09/01/21 1710 09/02/21 0008 09/02/21 0349  09/03/21 0331 09/05/21 0336 09/06/21 0330  NA 139  --  137 135 135 135  K 3.8  --  3.6 3.1* 3.8 3.6  CL 107  --  105 104 101 100  CO2 15*  --  18* 23 26 26   GLUCOSE 94  --  84 97 106* 108*  BUN 39*  --  33* 21 15 26*  CREATININE 1.20  --  1.11 0.82 0.87 0.78  CALCIUM 9.7  --  9.4 8.8* 9.4 9.3  MG  --  2.6*  --  2.0 2.0 2.3  PHOS  --  2.0*  --   --   --   --    GFR Estimated Creatinine Clearance: 81 mL/min (by C-G formula based on SCr of 0.78 mg/dL). Liver Function Tests: Recent Labs  Lab 09/01/21 1710 09/02/21 0349 09/03/21 0331  AST 29 25 19   ALT 23 20 15   ALKPHOS 64 61 53  BILITOT 1.9* 1.7* 1.4*  PROT 7.5 7.1 5.5*  ALBUMIN 4.3 4.1 3.2*   No results for input(s): LIPASE, AMYLASE in the last 168 hours. Recent Labs  Lab 09/02/21 0008  AMMONIA 28   Coagulation profile No results for input(s): INR, PROTIME in the last 168 hours.  CBC: Recent Labs  Lab 09/01/21 1710 09/02/21 0349 09/03/21 0331 09/05/21 0336 09/06/21 0330  WBC 13.2* 10.0 7.8 8.4 8.3  NEUTROABS 11.0* 7.6  --   --   --   HGB 16.4 15.9 13.2 14.3 15.0  HCT 50.6 49.2 40.6 43.5 45.6  MCV 90.8 91.8 91.2 89.5 90.5  PLT 218 185 138* 140* 152   Cardiac Enzymes: Recent Labs  Lab 09/02/21 0008  CKTOTAL 165   BNP: Invalid input(s): POCBNP CBG: Recent Labs  Lab 09/01/21 1745  GLUCAP 105*   D-Dimer No results for input(s): DDIMER in the last 72 hours. Hgb A1c No results for input(s): HGBA1C in the last 72 hours. Lipid Profile No results for input(s): CHOL, HDL, LDLCALC, TRIG, CHOLHDL, LDLDIRECT in the last 72 hours. Thyroid function studies No results for input(s): TSH, T4TOTAL, T3FREE, THYROIDAB in the last 72 hours.  Invalid input(s): FREET3 Anemia work up Recent Labs    09/04/21 [place]   Microbiology Recent Results (from the past 240 hour(s))  Resp Panel by RT-PCR (Flu A&B, Covid) Nasopharyngeal Swab     Status: None   Collection Time: 09/02/21 12:10 AM   Specimen:  Nasopharyngeal Swab; Nasopharyngeal(NP) swabs in vial transport medium  Result Value Ref Range Status   SARS Coronavirus 2 by RT PCR NEGATIVE NEGATIVE Final    Comment: (NOTE) SARS-CoV-2 target nucleic acids are NOT DETECTED.  The SARS-CoV-2 RNA is generally detectable in upper respiratory specimens during the acute phase of infection. The lowest concentration of SARS-CoV-2 viral copies this assay can detect is 138 copies/mL. A negative result does not preclude SARS-Cov-2 infection and should not be used as the sole basis for treatment or other patient management decisions. A negative result may occur with  improper specimen collection/handling, submission of specimen other than nasopharyngeal swab, presence of viral mutation(s) within the areas targeted by this assay, and inadequate number of viral copies(<138 copies/mL). A negative result must be combined with clinical observations, patient history, and epidemiological information. The expected result is Negative.  Fact Sheet for Patients:  EntrepreneurPulse.com.au  Fact Sheet for Healthcare Providers:  IncredibleEmployment.be  This test is no t yet approved or cleared by the [place] FDA and  has been authorized for detection and/or diagnosis of SARS-CoV-2 by FDA under an Emergency Use Authorization (EUA). This EUA will remain  in effect (meaning this test can be used) for the duration of the COVID-19 declaration under Section 564(b)(1) of the Act, 21 U.S.C.section 360bbb-3(b)(1), unless the authorization is terminated  or revoked sooner.       Influenza A by PCR NEGATIVE NEGATIVE Final   Influenza B by PCR NEGATIVE NEGATIVE Final    Comment: (NOTE) The Xpert Xpress SARS-CoV-2/FLU/RSV plus assay is intended as an aid in the diagnosis of influenza from Nasopharyngeal swab specimens and should not be used as a sole basis for treatment. Nasal washings and aspirates are unacceptable for  Xpert Xpress SARS-CoV-2/FLU/RSV testing.  Fact Sheet for Patients: EntrepreneurPulse.com.au  Fact Sheet for Healthcare Providers: IncredibleEmployment.be  This test is not yet approved or cleared by the [place] FDA and has been authorized for detection and/or diagnosis of SARS-CoV-2 by FDA under an Emergency Use Authorization (EUA). This EUA will remain in effect (meaning this test can be used) for the duration of the COVID-19 declaration under Section 564(b)(1) of the Act, 21 U.S.C. section 360bbb-3(b)(1), unless the authorization is terminated or revoked.  Performed at [facility] [place], [place] [address] [place], [place] [address]      Discharge Instructions:   Discharge Instructions     Diet - low sodium heart healthy   Complete by: As directed    Discharge instructions   Complete by: As directed    Do not take over-the-counter pain medication including Aleve Motrin/NSAIDs.  Follow-up with your primary  care provider at the skilled nursing facility in 3 to 5 days.  Continue Protonix twice daily for 2 months followed by once a day.  You will need to follow-up with GI in 2 months for possible endoscopy in 3 months.   Increase activity slowly   Complete by: As directed       Allergies as of 09/06/2021       Reactions   Codeine Nausea Only        Medication List     STOP taking these medications    baclofen 10 MG tablet Commonly known as: LIORESAL       TAKE these medications    acetaminophen 500 MG tablet Commonly known as: TYLENOL Take 500 mg by mouth every 8 (eight) hours as needed (pain).   feeding supplement Liqd Take 237 mLs by mouth 2 (two) times daily between meals.   folic acid 1 MG tablet Commonly known as: FOLVITE Take 1 tablet (1 mg total) by mouth daily.   gabapentin 100 MG capsule Commonly known as: NEURONTIN Take 2 capsules (200 mg total) by mouth 3 (three) times daily. What  changed:  medication strength how much to take   multivitamin with minerals Tabs tablet Take 1 tablet by mouth daily.   ondansetron 4 MG tablet Commonly known as: ZOFRAN Take 1 tablet (4 mg total) by mouth every 6 (six) hours as needed for nausea.   pantoprazole 40 MG tablet Commonly known as: Protonix Take 1 tablet (40 mg total) by mouth 2 (two) times daily before a meal for 60 days, THEN 1 tablet (40 mg total) daily. Start taking on: September 06, 2021 What changed: See the new instructions.   sertraline 100 MG tablet Commonly known as: ZOLOFT Take 100 mg by mouth in the morning and at bedtime.   thiamine 100 MG tablet Take 1 tablet (100 mg total) by mouth daily.        Follow-up Information     [facility] [place]. Schedule an appointment as soon as possible for a visit in 1 week(s).   Specialty: Family Medicine Contact information: [address] [place] [address] [phone]                  Time coordinating discharge: 39 minutes  Signed:  Anaiza Behrens  [facility] 09/06/2021, 12:06 PM

## 2021-09-06 NOTE — Care Management Important Message (Signed)
Important Message  Patient Details IM Letter given to the Patient. Name: Jose Bender MRN: [id number] Date of Birth: 11/12/1946   Medicare Important Message Given:  Yes     Kerin Salen 09/06/2021, 11:49 AM

## 2021-09-06 NOTE — TOC Progression Note (Addendum)
Transition of Care [facility]) - Progression Note    Patient Details  Name: Jose Bender MRN: [id number] Date of Birth: September 12, 1946  Transition of Care [facility] CM/SW Contact  Purcell Mouton, RN Phone Number: 09/06/2021, 12:37 PM  Clinical Narrative:    CM have called HCPOA Renaee Munda [phone] and [name] [phone] pt's friend several times with no answer to select SNF. At this present time there is no selection for SNF.  Spoke with pt who did not understand the question for SNF selection. Waiting for SNF selection.    Expected Discharge Plan: Home/Self Care Barriers to Discharge: No Barriers Identified  Expected Discharge Plan and Services Expected Discharge Plan: Home/Self Care   Discharge Planning Services: CM Consult   Living arrangements for the past 2 months: Single Family Home Expected Discharge Date: 09/06/21                                     Social Determinants of Health (SDOH) Interventions    Readmission Risk Interventions No flowsheet data found.

## 2021-09-06 NOTE — TOC Progression Note (Signed)
Transition of Care [facility]) - Progression Note    Patient Details  Name: Jose Bender MRN: [id number] Date of Birth: September 25, 1946  Transition of Care [facility]) CM/SW Contact  Purcell Mouton, RN Phone Number: 09/06/2021, 10:28 AM  Clinical Narrative:    Pt's HCPOA and friend [name] was called with no answer. Left VM for a return call.   Expected Discharge Plan: Home/Self Care Barriers to Discharge: No Barriers Identified  Expected Discharge Plan and Services Expected Discharge Plan: Home/Self Care   Discharge Planning Services: CM Consult   Living arrangements for the past 2 months: Single Family Home                                       Social Determinants of Health (SDOH) Interventions    Readmission Risk Interventions No flowsheet data found.

## 2021-09-07 DIAGNOSIS — R531 Weakness: Secondary | ICD-10-CM | POA: Diagnosis not present

## 2021-09-07 DIAGNOSIS — K922 Gastrointestinal hemorrhage, unspecified: Secondary | ICD-10-CM | POA: Diagnosis not present

## 2021-09-07 DIAGNOSIS — G9340 Encephalopathy, unspecified: Secondary | ICD-10-CM | POA: Diagnosis not present

## 2021-09-07 DIAGNOSIS — M545 Low back pain, unspecified: Secondary | ICD-10-CM | POA: Diagnosis not present

## 2021-09-07 NOTE — Progress Notes (Signed)
PROGRESS NOTE    Jose Bender  JJO:[id number] DOB: 06/22/1946 DOA: 09/01/2021 PCP: [facility]    Brief Narrative:   75 year old male with a history of chronic back pain on gabapentin and baclofen, depression on Zoloft, prior history of GI bleed, admitted to the hospital with increasing weakness and confusion.  Reportedly symptoms began after having a COVID booster shot.  He did report having nausea and vomiting which was blood-tinged.  On admission, he was noted to be dehydrated.  Work-up for encephalopathy was otherwise unrevealing.  Patient was then admitted hospital for further evaluation and treatment.   During hospitalization, patient underwent EGD.  PT has seen the patient and recommended skilled nursing facility placement.  Medically stable for disposition.  Assessment & Plan:   Principal Problem:   Weakness Active Problems:   Chronic low back pain   Acute encephalopathy   Malnutrition of moderate degree  Acute metabolic encephalopathy Resolved at this time.  Thought to be secondary to baclofen and gabapentin.  Baclofen will be discontinued on discharge.  Gabapentin dose has been decreased.  Patient does have baseline cognitive dysfunction.  Received thiamine while in the hospital.    MRI of the brain was unremarkable.  RPR vitamin B12 and ammonia and TSH within normal limits.   Vitamin B12 was 355.    Melena/coffee-ground emesis Patient underwent EGD which showed severe grade B erosive esophagitis with ulceration in the distal esophagus.  Biopsies were taken..  GI has recommended Protonix 40 twice daily for 2 months followed by 40 daily.  Patient is supposed to follow-up in GI clinic in 2 months and repeat EGD in 3 months to document healing of esophagitis.   High anion gap metabolic acidosis Resolved.  Hypokalemia Improved after repletion.  Potassium 3.6  Generalized weakness/debility. Seen by physical therapy recommended skilled nursing  facility placement.  Moderate protein calorie malnutrition.  Present on admission.  Continue Ensure supplements  DVT prophylaxis: SCDs Start: 09/02/21 0313  Code Status:  Full code  Family Communication:  Unable to reach the patient's HPOA  Disposition Plan:  Skilled nursing facility when bed is available.  Status is: Inpatient  The patient is inpatient because: Awaiting for skilled nursing facility.  Patient was given discharge instructions yesterday but due to difficulty finding the POA discharge was canceled.  Medically stable for disposition.  Consultants:  Gastroenterology  Procedures:  EGD on 09/04/2021  Antimicrobials:  None  Subjective: Today, patient was seen and examined at bedside.  Denies any dizziness lightheadedness shortness of breath fever chills or rigor.  Objective: Vitals:   09/06/21 0447 09/06/21 1357 09/06/21 1958 09/07/21 0500  BP: (!) 142/74 125/87 114/72 (!) 142/74  Pulse: 83 (!) 110 97 78  Resp: 20 18 16 16   Temp: 98.5 F (36.9 C) 98 F (36.7 C) 98.3 F (36.8 C) 98.6 F (37 C)  TempSrc: Oral Oral Oral Oral  SpO2: 97% 96% 97% 96%  Weight:      Height:        Intake/Output Summary (Last 24 hours) at 09/07/2021 1145 Last data filed at 09/06/2021 1811 Gross per 24 hour  Intake 530 ml  Output 375 ml  Net 155 ml    Filed Weights   09/01/21 1615 09/02/21 0127 09/03/21 0915  Weight: 81.6 kg 76.8 kg 76.8 kg    Physical examination: General:  Average built, not in obvious distress HENT:   No scleral pallor or icterus noted. Oral mucosa is moist.  Chest:  Clear breath  sounds.  Diminished breath sounds bilaterally. No crackles or wheezes.  CVS: S1 &S2 heard. No murmur.  Regular rate and rhythm. Abdomen: Soft, nontender, nondistended.  Bowel sounds are heard.   Extremities: No cyanosis, clubbing or edema.  Peripheral pulses are palpable. Psych: Alert, awake and oriented, normal mood CNS:  No cranial nerve deficits.  Power equal in all  extremities.   Skin: Warm and dry.  No rashes noted.  Data Reviewed: I have personally reviewed the following labs and imaging studies.     CBC: Recent Labs  Lab 09/01/21 1710 09/02/21 0349 09/03/21 0331 09/05/21 0336 09/06/21 0330  WBC 13.2* 10.0 7.8 8.4 8.3  NEUTROABS 11.0* 7.6  --   --   --   HGB 16.4 15.9 13.2 14.3 15.0  HCT 50.6 49.2 40.6 43.5 45.6  MCV 90.8 91.8 91.2 89.5 90.5  PLT 218 185 138* 140* 371    Basic Metabolic Panel: Recent Labs  Lab 09/01/21 1710 09/02/21 0008 09/02/21 0349 09/03/21 0331 09/05/21 0336 09/06/21 0330  NA 139  --  137 135 135 135  K 3.8  --  3.6 3.1* 3.8 3.6  CL 107  --  105 104 101 100  CO2 15*  --  18* 23 26 26   GLUCOSE 94  --  84 97 106* 108*  BUN 39*  --  33* 21 15 26*  CREATININE 1.20  --  1.11 0.82 0.87 0.78  CALCIUM 9.7  --  9.4 8.8* 9.4 9.3  MG  --  2.6*  --  2.0 2.0 2.3  PHOS  --  2.0*  --   --   --   --     GFR: Estimated Creatinine Clearance: 81 mL/min (by C-G formula based on SCr of 0.78 mg/dL). Liver Function Tests: Recent Labs  Lab 09/01/21 1710 09/02/21 0349 09/03/21 0331  AST 29 25 19   ALT 23 20 15   ALKPHOS 64 61 53  BILITOT 1.9* 1.7* 1.4*  PROT 7.5 7.1 5.5*  ALBUMIN 4.3 4.1 3.2*    No results for input(s): LIPASE, AMYLASE in the last 168 hours. Recent Labs  Lab 09/02/21 0008  AMMONIA 28    Coagulation Profile: No results for input(s): INR, PROTIME in the last 168 hours. Cardiac Enzymes: Recent Labs  Lab 09/02/21 0008  CKTOTAL 165    BNP (last 3 results) No results for input(s): PROBNP in the last 8760 hours. HbA1C: No results for input(s): HGBA1C in the last 72 hours. CBG: Recent Labs  Lab 09/01/21 1745  GLUCAP 105*    Lipid Profile: No results for input(s): CHOL, HDL, LDLCALC, TRIG, CHOLHDL, LDLDIRECT in the last 72 hours. Thyroid Function Tests: No results for input(s): TSH, T4TOTAL, FREET4, T3FREE, THYROIDAB in the last 72 hours.  Anemia Panel: Recent Labs     09/04/21 1349  VITAMINB12 355    Sepsis Labs: Recent Labs  Lab 09/02/21 0349  LATICACIDVEN 0.7     Recent Results (from the past 240 hour(s))  Resp Panel by RT-PCR (Flu A&B, Covid) Nasopharyngeal Swab     Status: None   Collection Time: 09/02/21 12:10 AM   Specimen: Nasopharyngeal Swab; Nasopharyngeal(NP) swabs in vial transport medium  Result Value Ref Range Status   SARS Coronavirus 2 by RT PCR NEGATIVE NEGATIVE Final    Comment: (NOTE) SARS-CoV-2 target nucleic acids are NOT DETECTED.  The SARS-CoV-2 RNA is generally detectable in upper respiratory specimens during the acute phase of infection. The lowest concentration of SARS-CoV-2 viral copies this assay  can detect is 138 copies/mL. A negative result does not preclude SARS-Cov-2 infection and should not be used as the sole basis for treatment or other patient management decisions. A negative result may occur with  improper specimen collection/handling, submission of specimen other than nasopharyngeal swab, presence of viral mutation(s) within the areas targeted by this assay, and inadequate number of viral copies(<138 copies/mL). A negative result must be combined with clinical observations, patient history, and epidemiological information. The expected result is Negative.  Fact Sheet for Patients:  EntrepreneurPulse.com.au  Fact Sheet for Healthcare Providers:  IncredibleEmployment.be  This test is no t yet approved or cleared by the [place] FDA and  has been authorized for detection and/or diagnosis of SARS-CoV-2 by FDA under an Emergency Use Authorization (EUA). This EUA will remain  in effect (meaning this test can be used) for the duration of the COVID-19 declaration under Section 564(b)(1) of the Act, 21 U.S.C.section 360bbb-3(b)(1), unless the authorization is terminated  or revoked sooner.       Influenza A by PCR NEGATIVE NEGATIVE Final   Influenza B by PCR  NEGATIVE NEGATIVE Final    Comment: (NOTE) The Xpert Xpress SARS-CoV-2/FLU/RSV plus assay is intended as an aid in the diagnosis of influenza from Nasopharyngeal swab specimens and should not be used as a sole basis for treatment. Nasal washings and aspirates are unacceptable for Xpert Xpress SARS-CoV-2/FLU/RSV testing.  Fact Sheet for Patients: EntrepreneurPulse.com.au  Fact Sheet for Healthcare Providers: IncredibleEmployment.be  This test is not yet approved or cleared by the [place] FDA and has been authorized for detection and/or diagnosis of SARS-CoV-2 by FDA under an Emergency Use Authorization (EUA). This EUA will remain in effect (meaning this test can be used) for the duration of the COVID-19 declaration under Section 564(b)(1) of the Act, 21 U.S.C. section 360bbb-3(b)(1), unless the authorization is terminated or revoked.  Performed at [facility], [place] [address] [place], [place] [address]   Resp Panel by RT-PCR (Flu A&B, Covid) Nasopharyngeal Swab     Status: None   Collection Time: 09/06/21 11:56 AM   Specimen: Nasopharyngeal Swab; Nasopharyngeal(NP) swabs in vial transport medium  Result Value Ref Range Status   SARS Coronavirus 2 by RT PCR NEGATIVE NEGATIVE Final    Comment: (NOTE) SARS-CoV-2 target nucleic acids are NOT DETECTED.  The SARS-CoV-2 RNA is generally detectable in upper respiratory specimens during the acute phase of infection. The lowest concentration of SARS-CoV-2 viral copies this assay can detect is 138 copies/mL. A negative result does not preclude SARS-Cov-2 infection and should not be used as the sole basis for treatment or other patient management decisions. A negative result may occur with  improper specimen collection/handling, submission of specimen other than nasopharyngeal swab, presence of viral mutation(s) within the areas targeted by this assay, and inadequate number of  viral copies(<138 copies/mL). A negative result must be combined with clinical observations, patient history, and epidemiological information. The expected result is Negative.  Fact Sheet for Patients:  EntrepreneurPulse.com.au  Fact Sheet for Healthcare Providers:  IncredibleEmployment.be  This test is no t yet approved or cleared by the [place] FDA and  has been authorized for detection and/or diagnosis of SARS-CoV-2 by FDA under an Emergency Use Authorization (EUA). This EUA will remain  in effect (meaning this test can be used) for the duration of the COVID-19 declaration under Section 564(b)(1) of the Act, 21 U.S.C.section 360bbb-3(b)(1), unless the authorization is terminated  or revoked sooner.  Influenza A by PCR NEGATIVE NEGATIVE Final   Influenza B by PCR NEGATIVE NEGATIVE Final    Comment: (NOTE) The Xpert Xpress SARS-CoV-2/FLU/RSV plus assay is intended as an aid in the diagnosis of influenza from Nasopharyngeal swab specimens and should not be used as a sole basis for treatment. Nasal washings and aspirates are unacceptable for Xpert Xpress SARS-CoV-2/FLU/RSV testing.  Fact Sheet for Patients: EntrepreneurPulse.com.au  Fact Sheet for Healthcare Providers: IncredibleEmployment.be  This test is not yet approved or cleared by the [place] FDA and has been authorized for detection and/or diagnosis of SARS-CoV-2 by FDA under an Emergency Use Authorization (EUA). This EUA will remain in effect (meaning this test can be used) for the duration of the COVID-19 declaration under Section 564(b)(1) of the Act, 21 U.S.C. section 360bbb-3(b)(1), unless the authorization is terminated or revoked.  Performed at [facility], [place] [address], [place], [place] [address]      Radiology Studies: No results found.   Scheduled Meds:  feeding supplement  237 mL Oral BID  BM   multivitamin with minerals  1 tablet Oral Daily   pantoprazole (PROTONIX) IV  40 mg Intravenous Q12H   Continuous Infusions:     LOS: 4 days   Flora Lipps, MD [facility] If 7PM-7AM, please contact night-coverage [email or web address] 09/07/2021, 11:45 AM

## 2021-09-07 NOTE — TOC Progression Note (Signed)
Transition of Care [facility]) - Progression Note    Patient Details  Name: Jose Bender MRN: [id number] Date of Birth: 24-Dec-1945  Transition of Care [facility]) CM/SW Contact  [name], [place] Phone Number: 09/07/2021, 10:12 AM  Clinical Narrative:     CSW spoke to patient regarding SNF placement.  Per patient his Chauncey Reading is on vacation in [place], which is why he was not able to be reached.  His other contact is also out of town.  Patient agreeable to SNF at [facility], however [facility] SNF can not accept patient over weekend.  TOC to follow up on Monday, updated attending physician and bedside nurse.   Expected Discharge Plan: Home/Self Care Barriers to Discharge: No Barriers Identified  Expected Discharge Plan and Services Expected Discharge Plan: Home/Self Care   Discharge Planning Services: CM Consult   Living arrangements for the past 2 months: Single Family Home Expected Discharge Date: 09/06/21                                     Social Determinants of Health (SDOH) Interventions    Readmission Risk Interventions No flowsheet data found.

## 2021-09-07 NOTE — Plan of Care (Signed)

## 2021-09-08 DIAGNOSIS — R531 Weakness: Secondary | ICD-10-CM | POA: Diagnosis not present

## 2021-09-08 DIAGNOSIS — K922 Gastrointestinal hemorrhage, unspecified: Secondary | ICD-10-CM | POA: Diagnosis not present

## 2021-09-08 DIAGNOSIS — G9340 Encephalopathy, unspecified: Secondary | ICD-10-CM | POA: Diagnosis not present

## 2021-09-08 DIAGNOSIS — M545 Low back pain, unspecified: Secondary | ICD-10-CM | POA: Diagnosis not present

## 2021-09-08 LAB — BASIC METABOLIC PANEL
Anion gap: 8 (ref 5–15)
BUN: 33 mg/dL — ABNORMAL HIGH (ref 8–23)
CO2: 29 mmol/L (ref 22–32)
Calcium: 9.4 mg/dL (ref 8.9–10.3)
Chloride: 100 mmol/L (ref 98–111)
Creatinine, Ser: 1.15 mg/dL (ref 0.61–1.24)
GFR, Estimated: >60 mL/min (ref >60)
Glucose, Bld: 81 mg/dL (ref 70–99)
Potassium: 3.8 mmol/L (ref 3.5–5.1)
Sodium: 137 mmol/L (ref 135–145)

## 2021-09-08 LAB — CBC
HCT: 46.1 % (ref 39.0–52.0)
Hemoglobin: 15 g/dL (ref 13.0–17.0)
MCH: 29.7 pg (ref 26.0–34.0)
MCHC: 32.5 g/dL (ref 30.0–36.0)
MCV: 91.3 fL (ref 80.0–100.0)
Platelets: 198 10*3/uL (ref 150–400)
RBC: 5.05 MIL/uL (ref 4.22–5.81)
RDW: 15 % (ref 11.5–15.5)
WBC: 10.9 10*3/uL — ABNORMAL HIGH (ref 4.0–10.5)
nRBC: 0 % (ref 0.0–0.2)

## 2021-09-08 LAB — MAGNESIUM: Magnesium: 2.1 mg/dL (ref 1.7–2.4)

## 2021-09-08 NOTE — Plan of Care (Signed)

## 2021-09-08 NOTE — Progress Notes (Signed)
PROGRESS NOTE    Jose Bender  YNW:[id number] DOB: Apr 11, 1946 DOA: 09/01/2021 PCP: [facility]    Brief Narrative:   75 year old male with a history of chronic back pain on gabapentin and baclofen, depression on Zoloft, prior history of GI bleed, admitted to the hospital with increasing weakness and confusion.  Reportedly symptoms began after having a COVID booster shot.  He did report having nausea and vomiting which was blood-tinged.  On admission, he was noted to be dehydrated.  Work-up for encephalopathy was otherwise unrevealing.  Patient was then admitted hospital for further evaluation and treatment.   During hospitalization, patient underwent EGD.  PT has seen the patient and recommended skilled nursing facility placement.  Medically stable for disposition.  Assessment & Plan:   Principal Problem:   Weakness Active Problems:   Chronic low back pain   Acute encephalopathy   Malnutrition of moderate degree  Acute metabolic encephalopathy Resolved at this time.  Thought to be secondary to baclofen and gabapentin.  Baclofen will be discontinued on discharge.  Gabapentin dose has been decreased.  Patient does have baseline cognitive dysfunction.  Received thiamine while in the hospital.    MRI of the brain was unremarkable.  RPR vitamin B12 and ammonia and TSH within normal limits.   Vitamin B12 was 355.    Melena/coffee-ground emesis Patient underwent EGD which showed severe grade B erosive esophagitis with ulceration in the distal esophagus.  Biopsies were taken..  GI has recommended Protonix 40mg  twice daily for 2 months followed by 40 mg daily.  Patient is supposed to follow-up in GI clinic in 2 months and repeat EGD in 3 months to document healing of esophagitis.   High anion gap metabolic acidosis Resolved.  Hypokalemia Improved after repletion.  Potassium of 3.8.  Generalized weakness/debility. Seen by physical therapy who recommended skilled  nursing facility placement.  Moderate protein calorie malnutrition.  Present on admission.  Continue Ensure supplements  DVT prophylaxis: SCDs Start: 09/02/21 0313  Code Status:  Full code  Family Communication:  Spoke with the patient at bedside  Disposition Plan:  Skilled nursing facility when bed is available and if arranged.  Status is: Inpatient  The patient is inpatient because: Awaiting for skilled nursing facility.    Consultants:  Gastroenterology  Procedures:  EGD on 09/04/2021  Antimicrobials:  None  Subjective: Today, patient was seen and examined at bedside.  Denies any dizziness, lightheadedness, shortness of breath, fever or chills.  Denies any nausea, vomiting, headache.  Eating okay.  Objective: Vitals:   09/06/21 1958 09/07/21 0500 09/07/21 1459 09/07/21 2100  BP: 114/72 (!) 142/74 105/82 124/76  Pulse: 97 78 (!) 103 85  Resp: 16 16 20 20   Temp: 98.3 F (36.8 C) 98.6 F (37 C) 98.7 F (37.1 C) 98.5 F (36.9 C)  TempSrc: Oral Oral Oral Oral  SpO2: 97% 96% 99% 98%  Weight:      Height:        Intake/Output Summary (Last 24 hours) at 09/08/2021 1134 Last data filed at 09/08/2021 0600 Gross per 24 hour  Intake 720 ml  Output 750 ml  Net -30 ml    Filed Weights   09/01/21 1615 09/02/21 0127 09/03/21 0915  Weight: 81.6 kg 76.8 kg 76.8 kg    Physical examination:  General:  Average built, not in obvious distress HENT:   No scleral pallor or icterus noted. Oral mucosa is moist.  Chest:  Clear breath sounds.  Diminished breath sounds bilaterally.  No crackles or wheezes.  CVS: S1 &S2 heard. No murmur.  Regular rate and rhythm. Abdomen: Soft, nontender, nondistended.  Bowel sounds are heard.   Extremities: No cyanosis, clubbing or edema.  Peripheral pulses are palpable. Psych: Alert, awake and oriented, normal mood, CNS:  No cranial nerve deficits.  Power equal in all extremities.   Skin: Warm and dry.  No rashes noted.  Data Reviewed: I  have personally reviewed the following labs and imaging studies.     CBC: Recent Labs  Lab 09/01/21 1710 09/02/21 0349 09/03/21 0331 09/05/21 0336 09/06/21 0330 09/08/21 0318  WBC 13.2* 10.0 7.8 8.4 8.3 10.9*  NEUTROABS 11.0* 7.6  --   --   --   --   HGB 16.4 15.9 13.2 14.3 15.0 15.0  HCT 50.6 49.2 40.6 43.5 45.6 46.1  MCV 90.8 91.8 91.2 89.5 90.5 91.3  PLT 218 185 138* 140* 152 166    Basic Metabolic Panel: Recent Labs  Lab 09/02/21 0008 09/02/21 0349 09/03/21 0331 09/05/21 0336 09/06/21 0330 09/08/21 0318  NA  --  137 135 135 135 137  K  --  3.6 3.1* 3.8 3.6 3.8  CL  --  105 104 101 100 100  CO2  --  18* 23 26 26 29   GLUCOSE  --  84 97 106* 108* 81  BUN  --  33* 21 15 26* 33*  CREATININE  --  1.11 0.82 0.87 0.78 1.15  CALCIUM  --  9.4 8.8* 9.4 9.3 9.4  MG 2.6*  --  2.0 2.0 2.3 2.1  PHOS 2.0*  --   --   --   --   --     GFR: Estimated Creatinine Clearance: 56.4 mL/min (by C-G formula based on SCr of 1.15 mg/dL). Liver Function Tests: Recent Labs  Lab 09/01/21 1710 09/02/21 0349 09/03/21 0331  AST 29 25 19   ALT 23 20 15   ALKPHOS 64 61 53  BILITOT 1.9* 1.7* 1.4*  PROT 7.5 7.1 5.5*  ALBUMIN 4.3 4.1 3.2*    No results for input(s): LIPASE, AMYLASE in the last 168 hours. Recent Labs  Lab 09/02/21 0008  AMMONIA 28    Coagulation Profile: No results for input(s): INR, PROTIME in the last 168 hours. Cardiac Enzymes: Recent Labs  Lab 09/02/21 0008  CKTOTAL 165    BNP (last 3 results) No results for input(s): PROBNP in the last 8760 hours. HbA1C: No results for input(s): HGBA1C in the last 72 hours. CBG: Recent Labs  Lab 09/01/21 1745  GLUCAP 105*    Lipid Profile: No results for input(s): CHOL, HDL, LDLCALC, TRIG, CHOLHDL, LDLDIRECT in the last 72 hours. Thyroid Function Tests: No results for input(s): TSH, T4TOTAL, FREET4, T3FREE, THYROIDAB in the last 72 hours.  Anemia Panel: No results for input(s): VITAMINB12, FOLATE, FERRITIN,  TIBC, IRON, RETICCTPCT in the last 72 hours.  Sepsis Labs: Recent Labs  Lab 09/02/21 0349  LATICACIDVEN 0.7     Recent Results (from the past 240 hour(s))  Resp Panel by RT-PCR (Flu A&B, Covid) Nasopharyngeal Swab     Status: None   Collection Time: 09/02/21 12:10 AM   Specimen: Nasopharyngeal Swab; Nasopharyngeal(NP) swabs in vial transport medium  Result Value Ref Range Status   SARS Coronavirus 2 by RT PCR NEGATIVE NEGATIVE Final    Comment: (NOTE) SARS-CoV-2 target nucleic acids are NOT DETECTED.  The SARS-CoV-2 RNA is generally detectable in upper respiratory specimens during the acute phase of infection. The lowest concentration of  SARS-CoV-2 viral copies this assay can detect is 138 copies/mL. A negative result does not preclude SARS-Cov-2 infection and should not be used as the sole basis for treatment or other patient management decisions. A negative result may occur with  improper specimen collection/handling, submission of specimen other than nasopharyngeal swab, presence of viral mutation(s) within the areas targeted by this assay, and inadequate number of viral copies(<138 copies/mL). A negative result must be combined with clinical observations, patient history, and epidemiological information. The expected result is Negative.  Fact Sheet for Patients:  EntrepreneurPulse.com.au  Fact Sheet for Healthcare Providers:  IncredibleEmployment.be  This test is no t yet approved or cleared by the [place] FDA and  has been authorized for detection and/or diagnosis of SARS-CoV-2 by FDA under an Emergency Use Authorization (EUA). This EUA will remain  in effect (meaning this test can be used) for the duration of the COVID-19 declaration under Section 564(b)(1) of the Act, 21 U.S.C.section 360bbb-3(b)(1), unless the authorization is terminated  or revoked sooner.       Influenza A by PCR NEGATIVE NEGATIVE Final   Influenza B  by PCR NEGATIVE NEGATIVE Final    Comment: (NOTE) The Xpert Xpress SARS-CoV-2/FLU/RSV plus assay is intended as an aid in the diagnosis of influenza from Nasopharyngeal swab specimens and should not be used as a sole basis for treatment. Nasal washings and aspirates are unacceptable for Xpert Xpress SARS-CoV-2/FLU/RSV testing.  Fact Sheet for Patients: EntrepreneurPulse.com.au  Fact Sheet for Healthcare Providers: IncredibleEmployment.be  This test is not yet approved or cleared by the [place] FDA and has been authorized for detection and/or diagnosis of SARS-CoV-2 by FDA under an Emergency Use Authorization (EUA). This EUA will remain in effect (meaning this test can be used) for the duration of the COVID-19 declaration under Section 564(b)(1) of the Act, 21 U.S.C. section 360bbb-3(b)(1), unless the authorization is terminated or revoked.  Performed at [facility], [place] [address], [place], [place] [address]   Resp Panel by RT-PCR (Flu A&B, Covid) Nasopharyngeal Swab     Status: None   Collection Time: 09/06/21 11:56 AM   Specimen: Nasopharyngeal Swab; Nasopharyngeal(NP) swabs in vial transport medium  Result Value Ref Range Status   SARS Coronavirus 2 by RT PCR NEGATIVE NEGATIVE Final    Comment: (NOTE) SARS-CoV-2 target nucleic acids are NOT DETECTED.  The SARS-CoV-2 RNA is generally detectable in upper respiratory specimens during the acute phase of infection. The lowest concentration of SARS-CoV-2 viral copies this assay can detect is 138 copies/mL. A negative result does not preclude SARS-Cov-2 infection and should not be used as the sole basis for treatment or other patient management decisions. A negative result may occur with  improper specimen collection/handling, submission of specimen other than nasopharyngeal swab, presence of viral mutation(s) within the areas targeted by this assay, and inadequate  number of viral copies(<138 copies/mL). A negative result must be combined with clinical observations, patient history, and epidemiological information. The expected result is Negative.  Fact Sheet for Patients:  EntrepreneurPulse.com.au  Fact Sheet for Healthcare Providers:  IncredibleEmployment.be  This test is no t yet approved or cleared by the [place] FDA and  has been authorized for detection and/or diagnosis of SARS-CoV-2 by FDA under an Emergency Use Authorization (EUA). This EUA will remain  in effect (meaning this test can be used) for the duration of the COVID-19 declaration under Section 564(b)(1) of the Act, 21 U.S.C.section 360bbb-3(b)(1), unless the authorization is terminated  or revoked  sooner.       Influenza A by PCR NEGATIVE NEGATIVE Final   Influenza B by PCR NEGATIVE NEGATIVE Final    Comment: (NOTE) The Xpert Xpress SARS-CoV-2/FLU/RSV plus assay is intended as an aid in the diagnosis of influenza from Nasopharyngeal swab specimens and should not be used as a sole basis for treatment. Nasal washings and aspirates are unacceptable for Xpert Xpress SARS-CoV-2/FLU/RSV testing.  Fact Sheet for Patients: EntrepreneurPulse.com.au  Fact Sheet for Healthcare Providers: IncredibleEmployment.be  This test is not yet approved or cleared by the [place] FDA and has been authorized for detection and/or diagnosis of SARS-CoV-2 by FDA under an Emergency Use Authorization (EUA). This EUA will remain in effect (meaning this test can be used) for the duration of the COVID-19 declaration under Section 564(b)(1) of the Act, 21 U.S.C. section 360bbb-3(b)(1), unless the authorization is terminated or revoked.  Performed at [facility], [place] [address], [place], [place] [address]      Radiology Studies: No results found.   Scheduled Meds:  feeding supplement  237 mL  Oral BID BM   multivitamin with minerals  1 tablet Oral Daily   pantoprazole (PROTONIX) IV  40 mg Intravenous Q12H   Continuous Infusions:     LOS: 5 days   Flora Lipps, MD [facility] If 7PM-7AM, please contact night-coverage [email or web address] 09/08/2021, 11:34 AM

## 2021-09-09 DIAGNOSIS — G8929 Other chronic pain: Secondary | ICD-10-CM | POA: Diagnosis not present

## 2021-09-09 DIAGNOSIS — M545 Low back pain, unspecified: Secondary | ICD-10-CM | POA: Diagnosis not present

## 2021-09-09 DIAGNOSIS — R531 Weakness: Secondary | ICD-10-CM | POA: Diagnosis not present

## 2021-09-09 DIAGNOSIS — Z7401 Bed confinement status: Secondary | ICD-10-CM | POA: Diagnosis not present

## 2021-09-09 DIAGNOSIS — R5383 Other fatigue: Secondary | ICD-10-CM | POA: Diagnosis not present

## 2021-09-09 DIAGNOSIS — R41841 Cognitive communication deficit: Secondary | ICD-10-CM | POA: Diagnosis not present

## 2021-09-09 DIAGNOSIS — E440 Moderate protein-calorie malnutrition: Secondary | ICD-10-CM | POA: Diagnosis not present

## 2021-09-09 DIAGNOSIS — R5381 Other malaise: Secondary | ICD-10-CM | POA: Diagnosis not present

## 2021-09-09 DIAGNOSIS — R4182 Altered mental status, unspecified: Secondary | ICD-10-CM | POA: Diagnosis not present

## 2021-09-09 DIAGNOSIS — M5441 Lumbago with sciatica, right side: Secondary | ICD-10-CM | POA: Diagnosis not present

## 2021-09-09 DIAGNOSIS — G9340 Encephalopathy, unspecified: Secondary | ICD-10-CM | POA: Diagnosis not present

## 2021-09-09 DIAGNOSIS — R2681 Unsteadiness on feet: Secondary | ICD-10-CM | POA: Diagnosis not present

## 2021-09-09 DIAGNOSIS — K5712 Diverticulitis of small intestine without perforation or abscess without bleeding: Secondary | ICD-10-CM | POA: Diagnosis not present

## 2021-09-09 DIAGNOSIS — E860 Dehydration: Secondary | ICD-10-CM | POA: Diagnosis not present

## 2021-09-09 DIAGNOSIS — F339 Major depressive disorder, recurrent, unspecified: Secondary | ICD-10-CM | POA: Diagnosis not present

## 2021-09-09 DIAGNOSIS — K922 Gastrointestinal hemorrhage, unspecified: Secondary | ICD-10-CM | POA: Diagnosis not present

## 2021-09-09 DIAGNOSIS — I700 Atherosclerosis of aorta: Secondary | ICD-10-CM | POA: Diagnosis not present

## 2021-09-09 DIAGNOSIS — R1312 Dysphagia, oropharyngeal phase: Secondary | ICD-10-CM | POA: Diagnosis not present

## 2021-09-09 DIAGNOSIS — E876 Hypokalemia: Secondary | ICD-10-CM | POA: Diagnosis not present

## 2021-09-09 DIAGNOSIS — M6281 Muscle weakness (generalized): Secondary | ICD-10-CM | POA: Diagnosis not present

## 2021-09-09 MED ORDER — PANTOPRAZOLE SODIUM 40 MG PO TBEC
40.0000 mg | DELAYED_RELEASE_TABLET | Freq: Two times a day (BID) | ORAL | Status: DC
  Administered 2021-09-09: 40 mg via ORAL
  Filled 2021-09-09: qty 1

## 2021-09-09 NOTE — TOC Progression Note (Signed)
Transition of Care [facility]) - Progression Note    Patient Details  Name: Jose Bender MRN: [id number] Date of Birth: Feb 01, 1946  [facility] CM/SW Contact  Purcell Mouton, RN Phone Number: 09/09/2021, 11:57 AM  Clinical Narrative:    Corey Dhruv was called. Pt's HCPOA, pt and RN are aware that pt will transport to [place] today.    Expected Discharge Plan: Home/Self Care Barriers to Discharge: No Barriers Identified  Expected Discharge Plan and Services Expected Discharge Plan: Home/Self Care   Discharge Planning Services: CM Consult   Living arrangements for the past 2 months: Single Family Home Expected Discharge Date: 09/09/21                                     Social Determinants of Health (SDOH) Interventions    Readmission Risk Interventions No flowsheet data found.

## 2021-09-09 NOTE — Discharge Summary (Signed)
Physician Discharge Summary  Rykar Lebleu DXA:[id number] DOB: January 06, 1946 DOA: 09/01/2021  PCP: [facility]  Admit date: 09/01/2021 Discharge date: 09/09/2021  Admitted From: Home  Discharge disposition: Skilled nursing facility  Recommendations for Outpatient Follow-Up:   Follow up with your primary care provider at the skilled nursing facility in 3 to 5 days Check CBC, BMP, magnesium in the next visit Follow-up with the GI Dr. Alessandra Bevels in 2 months for possible repeat endoscopy  Discharge Diagnosis:   Principal Problem:   Weakness Active Problems:   Chronic low back pain   Acute encephalopathy   Malnutrition of moderate degree Esophagitis.  Discharge Condition: Improved.  Diet recommendation:   Regular.  Wound care: None.  Code status: Full.  History of Present Illness:   75 year old male with a history of chronic back pain on gabapentin and baclofen, depression on Zoloft, prior history of GI bleed was admitted to the hospital with increasing weakness and confusion.  Reportedly symptoms began after having a COVID booster shot.  He did report having nausea and vomiting which was blood-tinged.  On admission, he was noted to be dehydrated.  Work-up for encephalopathy was otherwise unrevealing.  Patient was then admitted hospital for further evaluation and treatment.   Hospital Course:   Following conditions were addressed during hospitalization as listed below,  Acute metabolic encephalopathy Likely multifactorial on the background of cognitive dysfunction could be medication induced.  Improved at this time.    He received IV thiamine while in the hospital and will be continued on oral thiamine.   RPR vitamin B12 and ammonia and TSH within normal limits.  During hospitalization gabapentin and baclofen was on hold.  On discharge gabapentin will be reduced 200 mg 3 times daily from 300 mg 3 times daily.  Baclofen will be discontinued.  Vitamin B12  was 355.     Melena/coffee-ground emesis Patient did have heme positive stools.  Last endoscopy done in 2020 showed duodenal ulcer/esophageal ulcers and gastritis.  Seen by GI this admission and underwent EGD which showed severe grade B erosive esophagitis with ulceration in the distal esophagus.  Biopsies were taken..  GI has recommended Protonix 40 twice daily for 2 months followed by 40 milligrams daily.  Patient is supposed to follow-up in GI clinic in 2 months and repeat EGD in 3 months to document healing of esophagitis.  Patient was seen by Dr. Alessandra Bevels GI during hospitalization.   High anion gap metabolic acidosis Resolved.  Improved with IV fluids.   Hypokalemia Improved after supplement.  Potassium prior to discharge was 3.8.   Generalized weakness/debility. Patient has been seen by physical and occupational therapy recommended skilled nursing facility placement on discharge.   Moderate protein calorie malnutrition.  Present on admission.  Continue Ensure supplements on discharge.  Disposition.  At this time, patient is stable for disposition to skilled nursing facility.  Patient will have to follow-up with GI as outpatient in 2 months for possible repeat endoscopy.  Medical Consultants:   GI Procedures:    EGD Subjective:   Today, patient was seen and examined today.  Denies interval complaints.  Denies any nausea vomiting abdominal pain fever or chills.  Discharge Exam:   Vitals:   09/08/21 1940 09/09/21 0435  BP:  122/75  Pulse: 97 71  Resp:  15  Temp:  98.2 F (36.8 C)  SpO2:  97%   Vitals:   09/07/21 2100 09/08/21 1928 09/08/21 1940 09/09/21 0435  BP: 124/76 122/70  122/75  Pulse: 85 (!) 114 97 71  Resp: 20 18  15   Temp: 98.5 F (36.9 C) 98.4 F (36.9 C)  98.2 F (36.8 C)  TempSrc: Oral Oral  Oral  SpO2: 98% 95%  97%  Weight:      Height:       General:  Average built, not in obvious distress HENT:   No scleral pallor or icterus noted. Oral  mucosa is moist.  Chest:  Clear breath sounds.  Diminished breath sounds bilaterally. No crackles or wheezes.  CVS: S1 &S2 heard. No murmur.  Regular rate and rhythm. Abdomen: Soft, nontender, nondistended.  Bowel sounds are heard.   Extremities: No cyanosis, clubbing or edema.  Peripheral pulses are palpable. Psych: Alert, awake and oriented, normal mood CNS:  No cranial nerve deficits.  Power equal in all extremities.   Skin: Warm and dry.  No rashes noted.   The results of significant diagnostics from this hospitalization (including imaging, microbiology, ancillary and laboratory) are listed below for reference.     Diagnostic Studies:   DG Chest 2 View  Result Date: 09/01/2021 CLINICAL DATA:  Altered mental status EXAM: CHEST - 2 VIEW COMPARISON:  None. FINDINGS: Heart and mediastinal contours are within normal limits. No focal opacities or effusions. No acute bony abnormality. IMPRESSION: No active cardiopulmonary disease. Electronically Signed   By: Rolm Baptise M.D.   On: 09/01/2021 17:26   CT Head Wo Contrast  Result Date: 09/01/2021 CLINICAL DATA:  Head trauma, minor (Age >= 65y) EXAM: CT HEAD WITHOUT CONTRAST TECHNIQUE: Contiguous axial images were obtained from the base of the skull through the vertex without intravenous contrast. COMPARISON:  None. FINDINGS: Brain: There is no acute intracranial hemorrhage, mass effect, or edema. Gray-white differentiation is preserved. There is no extra-axial fluid collection. Prominence of the ventricles and sulci reflects mild parenchymal volume loss. Patchy and confluent hypoattenuation in the supratentorial white matter is nonspecific but probably reflects moderate chronic microvascular ischemic changes. Vascular: There is atherosclerotic calcification at the skull base. Skull: Calvarium is unremarkable. Sinuses/Orbits: No acute finding. Other: None. IMPRESSION: No evidence of acute intracranial injury. Electronically Signed   By: Macy Mis M.D.   On: 09/01/2021 18:03   MR BRAIN WO CONTRAST  Result Date: 09/02/2021 CLINICAL DATA:  Mental status change, unknown cause. Additional history provided: Nausea and vomiting for 1 day after receiving the flu vaccine, subsequent weakness, confusion. EXAM: MRI HEAD WITHOUT CONTRAST TECHNIQUE: Multiplanar, multiecho pulse sequences of the brain and surrounding structures were obtained without intravenous contrast. COMPARISON:  Head CT 09/01/2021. FINDINGS: Brain: Intermittently motion degraded examination, limiting evaluation. Most notably, there is severe motion degradation of the axial T2 TSE sequence, moderate motion degradation of the axial SWI sequence, moderate motion degradation of the axial T1 weighted sequence and moderate motion degradation of the coronal T2 TSE sequence. Mild parenchymal atrophy. Moderate multifocal T2 FLAIR hyperintense signal abnormality within the cerebral white matter, nonspecific but compatible with chronic small vessel ischemic disease. Chronic small vessel ischemic changes are also present within the bilateral basal ganglia and within the pons. There is no acute infarct. No evidence of an intracranial mass. No chronic intracranial blood products. No extra-axial fluid collection. No midline shift. Vascular: Maintained flow voids within the proximal large arterial vessels. Skull and upper cervical spine: No focal suspicious marrow lesion. Sinuses/Orbits: Visualized orbits show no acute finding. Other: Trace fluid within the left mastoid air cells. IMPRESSION: Motion degraded exam, as described. No evidence of acute intracranial abnormality.  Moderate chronic small vessel ischemic changes within the cerebral white matter. Chronic small vessel ischemic changes are also present within the bilateral basal ganglia and within the pons. Mild generalized parenchymal atrophy. Electronically Signed   By: Kellie Simmering D.O.   On: 09/02/2021 12:45   CT RENAL STONE STUDY  Result  Date: 09/02/2021 CLINICAL DATA:  Back pain, flank pain EXAM: CT ABDOMEN AND PELVIS WITHOUT CONTRAST TECHNIQUE: Multidetector CT imaging of the abdomen and pelvis was performed following the standard protocol without IV contrast. COMPARISON:  08/10/2019 FINDINGS: Lower chest: Hiatal hernia. No pleural or pericardial effusion. Coarse subpleural linear opacities posteriorly in both lung bases. Hepatobiliary: Stable small hepatic cysts. No calcified stones. The gallbladder is nondistended. No biliary ductal dilatation. Pancreas: Unremarkable. No pancreatic ductal dilatation or surrounding inflammatory changes. Spleen: Normal in size without focal abnormality. Adrenals/Urinary Tract: Upper pole right and lower pole left renal lesions possibly cysts, incompletely characterized. No urolithiasis or hydronephrosis. Urinary bladder is physiologically distended. Stomach/Bowel: Moderate hiatal hernia involving much of the fundus. The stomach is nondilated. The small bowel is non distended. Normal appendix. The colon is nondilated, unremarkable. Vascular/Lymphatic: Scattered aortoiliac calcified plaque without aneurysm. No abdominal or pelvic adenopathy. Reproductive: Mild prostate enlargement with central coarse calcifications. Other: Left pelvic phleboliths.  No ascites.  No free air. Musculoskeletal: Multilevel lower thoracic and lumbar spondylitic change. No fracture or worrisome bone lesion. IMPRESSION: 1. No acute findings.  No urolithiasis. 2. Hiatal hernia 3.  Aortic Atherosclerosis (ICD10-170.0). Electronically Signed   By: Lucrezia Europe M.D.   On: 09/02/2021 12:26     Labs:   Basic Metabolic Panel: Recent Labs  Lab 09/03/21 0331 09/05/21 0336 09/06/21 0330 09/08/21 0318  NA 135 135 135 137  K 3.1* 3.8 3.6 3.8  CL 104 101 100 100  CO2 23 26 26 29   GLUCOSE 97 106* 108* 81  BUN 21 15 26* 33*  CREATININE 0.82 0.87 0.78 1.15  CALCIUM 8.8* 9.4 9.3 9.4  MG 2.0 2.0 2.3 2.1    GFR Estimated Creatinine  Clearance: 56.4 mL/min (by C-G formula based on SCr of 1.15 mg/dL). Liver Function Tests: Recent Labs  Lab 09/03/21 0331  AST 19  ALT 15  ALKPHOS 53  BILITOT 1.4*  PROT 5.5*  ALBUMIN 3.2*    No results for input(s): LIPASE, AMYLASE in the last 168 hours. No results for input(s): AMMONIA in the last 168 hours.  Coagulation profile No results for input(s): INR, PROTIME in the last 168 hours.  CBC: Recent Labs  Lab 09/03/21 0331 09/05/21 0336 09/06/21 0330 09/08/21 0318  WBC 7.8 8.4 8.3 10.9*  HGB 13.2 14.3 15.0 15.0  HCT 40.6 43.5 45.6 46.1  MCV 91.2 89.5 90.5 91.3  PLT 138* 140* 152 198    Cardiac Enzymes: No results for input(s): CKTOTAL, CKMB, CKMBINDEX, TROPONINI in the last 168 hours.  BNP: Invalid input(s): POCBNP CBG: No results for input(s): GLUCAP in the last 168 hours.  D-Dimer No results for input(s): DDIMER in the last 72 hours. Hgb A1c No results for input(s): HGBA1C in the last 72 hours. Lipid Profile No results for input(s): CHOL, HDL, LDLCALC, TRIG, CHOLHDL, LDLDIRECT in the last 72 hours. Thyroid function studies No results for input(s): TSH, T4TOTAL, T3FREE, THYROIDAB in the last 72 hours.  Invalid input(s): FREET3 Anemia work up No results for input(s): VITAMINB12, FOLATE, FERRITIN, TIBC, IRON, RETICCTPCT in the last 72 hours.  Microbiology Recent Results (from the past 240 hour(s))  Resp Panel by RT-PCR (  Flu A&B, Covid) Nasopharyngeal Swab     Status: None   Collection Time: 09/02/21 12:10 AM   Specimen: Nasopharyngeal Swab; Nasopharyngeal(NP) swabs in vial transport medium  Result Value Ref Range Status   SARS Coronavirus 2 by RT PCR NEGATIVE NEGATIVE Final    Comment: (NOTE) SARS-CoV-2 target nucleic acids are NOT DETECTED.  The SARS-CoV-2 RNA is generally detectable in upper respiratory specimens during the acute phase of infection. The lowest concentration of SARS-CoV-2 viral copies this assay can detect is 138 copies/mL. A  negative result does not preclude SARS-Cov-2 infection and should not be used as the sole basis for treatment or other patient management decisions. A negative result may occur with  improper specimen collection/handling, submission of specimen other than nasopharyngeal swab, presence of viral mutation(s) within the areas targeted by this assay, and inadequate number of viral copies(<138 copies/mL). A negative result must be combined with clinical observations, patient history, and epidemiological information. The expected result is Negative.  Fact Sheet for Patients:  EntrepreneurPulse.com.au  Fact Sheet for Healthcare Providers:  IncredibleEmployment.be  This test is no t yet approved or cleared by the [place] FDA and  has been authorized for detection and/or diagnosis of SARS-CoV-2 by FDA under an Emergency Use Authorization (EUA). This EUA will remain  in effect (meaning this test can be used) for the duration of the COVID-19 declaration under Section 564(b)(1) of the Act, 21 U.S.C.section 360bbb-3(b)(1), unless the authorization is terminated  or revoked sooner.       Influenza A by PCR NEGATIVE NEGATIVE Final   Influenza B by PCR NEGATIVE NEGATIVE Final    Comment: (NOTE) The Xpert Xpress SARS-CoV-2/FLU/RSV plus assay is intended as an aid in the diagnosis of influenza from Nasopharyngeal swab specimens and should not be used as a sole basis for treatment. Nasal washings and aspirates are unacceptable for Xpert Xpress SARS-CoV-2/FLU/RSV testing.  Fact Sheet for Patients: EntrepreneurPulse.com.au  Fact Sheet for Healthcare Providers: IncredibleEmployment.be  This test is not yet approved or cleared by the [place] FDA and has been authorized for detection and/or diagnosis of SARS-CoV-2 by FDA under an Emergency Use Authorization (EUA). This EUA will remain in effect (meaning this test can  be used) for the duration of the COVID-19 declaration under Section 564(b)(1) of the Act, 21 U.S.C. section 360bbb-3(b)(1), unless the authorization is terminated or revoked.  Performed at [facility], [place] [address] [place], [place] [address]   Resp Panel by RT-PCR (Flu A&B, Covid) Nasopharyngeal Swab     Status: None   Collection Time: 09/06/21 11:56 AM   Specimen: Nasopharyngeal Swab; Nasopharyngeal(NP) swabs in vial transport medium  Result Value Ref Range Status   SARS Coronavirus 2 by RT PCR NEGATIVE NEGATIVE Final    Comment: (NOTE) SARS-CoV-2 target nucleic acids are NOT DETECTED.  The SARS-CoV-2 RNA is generally detectable in upper respiratory specimens during the acute phase of infection. The lowest concentration of SARS-CoV-2 viral copies this assay can detect is 138 copies/mL. A negative result does not preclude SARS-Cov-2 infection and should not be used as the sole basis for treatment or other patient management decisions. A negative result may occur with  improper specimen collection/handling, submission of specimen other than nasopharyngeal swab, presence of viral mutation(s) within the areas targeted by this assay, and inadequate number of viral copies(<138 copies/mL). A negative result must be combined with clinical observations, patient history, and epidemiological information. The expected result is Negative.  Fact Sheet for Patients:  EntrepreneurPulse.com.au  Fact Sheet for Healthcare Providers:  IncredibleEmployment.be  This test is no t yet approved or cleared by the [place] FDA and  has been authorized for detection and/or diagnosis of SARS-CoV-2 by FDA under an Emergency Use Authorization (EUA). This EUA will remain  in effect (meaning this test can be used) for the duration of the COVID-19 declaration under Section 564(b)(1) of the Act, 21 U.S.C.section 360bbb-3(b)(1), unless the  authorization is terminated  or revoked sooner.       Influenza A by PCR NEGATIVE NEGATIVE Final   Influenza B by PCR NEGATIVE NEGATIVE Final    Comment: (NOTE) The Xpert Xpress SARS-CoV-2/FLU/RSV plus assay is intended as an aid in the diagnosis of influenza from Nasopharyngeal swab specimens and should not be used as a sole basis for treatment. Nasal washings and aspirates are unacceptable for Xpert Xpress SARS-CoV-2/FLU/RSV testing.  Fact Sheet for Patients: EntrepreneurPulse.com.au  Fact Sheet for Healthcare Providers: IncredibleEmployment.be  This test is not yet approved or cleared by the [place] FDA and has been authorized for detection and/or diagnosis of SARS-CoV-2 by FDA under an Emergency Use Authorization (EUA). This EUA will remain in effect (meaning this test can be used) for the duration of the COVID-19 declaration under Section 564(b)(1) of the Act, 21 U.S.C. section 360bbb-3(b)(1), unless the authorization is terminated or revoked.  Performed at [facility], [place] [address] [place], [place] [address]      Discharge Instructions:   Discharge Instructions     Diet - low sodium heart healthy   Complete by: As directed    Discharge instructions   Complete by: As directed    Do not take over-the-counter pain medication including Aleve Motrin/NSAIDs.  Follow-up with your primary care provider at the skilled nursing facility in 3 to 5 days.  Continue Protonix twice daily for 2 months followed by once a day.  You will need to follow-up with GI in 2 months for possible endoscopy in 3 months.   Increase activity slowly   Complete by: As directed       Allergies as of 09/09/2021       Reactions   Codeine Nausea Only        Medication List     STOP taking these medications    baclofen 10 MG tablet Commonly known as: LIORESAL       TAKE these medications    acetaminophen 500 MG  tablet Commonly known as: TYLENOL Take 500 mg by mouth every 8 (eight) hours as needed (pain).   feeding supplement Liqd Take 237 mLs by mouth 2 (two) times daily between meals.   folic acid 1 MG tablet Commonly known as: FOLVITE Take 1 tablet (1 mg total) by mouth daily.   gabapentin 100 MG capsule Commonly known as: NEURONTIN Take 2 capsules (200 mg total) by mouth 3 (three) times daily. What changed:  medication strength how much to take   multivitamin with minerals Tabs tablet Take 1 tablet by mouth daily.   ondansetron 4 MG tablet Commonly known as: ZOFRAN Take 1 tablet (4 mg total) by mouth every 6 (six) hours as needed for nausea.   pantoprazole 40 MG tablet Commonly known as: Protonix Take 1 tablet (40 mg total) by mouth 2 (two) times daily before a meal for 60 days, THEN 1 tablet (40 mg total) daily. Start taking on: September 06, 2021 What changed: See the new instructions.   sertraline 100 MG tablet Commonly known as: ZOLOFT Take 100  mg by mouth in the morning and at bedtime.   thiamine 100 MG tablet Take 1 tablet (100 mg total) by mouth daily.        Follow-up Information     [facility] [place]. Schedule an appointment as soon as possible for a visit in 1 week(s).   Specialty: Family Medicine Contact information: [place] [address] [phone]         Otis Brace, MD. Schedule an appointment as soon as possible for a visit in 2 month(s).   Specialty: Gastroenterology Why: for possible endoscopy Contact information: [place] [address] [phone]                  Time coordinating discharge: 39 minutes  Signed:  Marixa Mellott  [facility] 09/09/2021, 9:21 AM

## 2021-09-09 NOTE — Care Management Important Message (Signed)
Important Message  Patient Details IM Letter given to the Patient. Name: Jose Bender MRN: [id number] Date of Birth: 1946-10-07   Medicare Important Message Given:  Yes     Kerin Salen 09/09/2021, 1:38 PM

## 2021-09-09 NOTE — Plan of Care (Signed)

## 2021-09-09 NOTE — Progress Notes (Signed)
EMS has been dispatched, Report called to [place] at [place]. SRP, RN

## 2021-09-10 ENCOUNTER — Non-Acute Institutional Stay (SKILLED_NURSING_FACILITY): Payer: Medicare Other | Admitting: Adult Health

## 2021-09-10 ENCOUNTER — Encounter: Payer: Self-pay | Admitting: Adult Health

## 2021-09-10 DIAGNOSIS — G8929 Other chronic pain: Secondary | ICD-10-CM | POA: Diagnosis not present

## 2021-09-10 DIAGNOSIS — M5441 Lumbago with sciatica, right side: Secondary | ICD-10-CM | POA: Diagnosis not present

## 2021-09-10 DIAGNOSIS — E876 Hypokalemia: Secondary | ICD-10-CM

## 2021-09-10 DIAGNOSIS — R531 Weakness: Secondary | ICD-10-CM | POA: Diagnosis not present

## 2021-09-10 DIAGNOSIS — G9340 Encephalopathy, unspecified: Secondary | ICD-10-CM

## 2021-09-10 DIAGNOSIS — F339 Major depressive disorder, recurrent, unspecified: Secondary | ICD-10-CM

## 2021-09-10 DIAGNOSIS — K922 Gastrointestinal hemorrhage, unspecified: Secondary | ICD-10-CM | POA: Diagnosis not present

## 2021-09-10 NOTE — Progress Notes (Signed)
Location:  [facility] Room Number: 462-V Place of Service:  SNF (31) Provider:  Durenda Age, DNP, FNP-BC  Patient Care Team: Pa, [place] as PCP - General (Family Medicine)  Extended Emergency Contact Information Primary Emergency Contact: [place], [place] [address] [name] of [place] Phone: [phone] Relation: Friend Secondary Emergency Contact: [name], [place] of [employer] Phone: [phone] Relation: Friend Preferred language: English Interpreter needed? No  Code Status:  Full Code   Goals of care: Advanced Directive information Advanced Directives 09/10/2021  Does Patient Have a Medical Advance Directive? Yes  Type of Paramedic of [place];Living will  Does patient want to make changes to medical advance directive? No - Patient declined  Copy of [place] in Chart? Yes - validated most recent copy scanned in chart (See row information)  Would patient like information on creating a medical advance directive? -     Chief Complaint  Patient presents with   Hospitalization Follow-up    Follow-up from recent hospital stay     HPI:  Pt is a 75 y.o. male who was admitted to [facility] on 09/09/2021 post hospital admission 09/01/2021 to 09/09/2021.  He has medical history significant for chronic back pain on gabapentin and baclofen, depression on Zoloft and prior history of GI bleed.  Patient stated having flu vaccine a week prior to hospitalization and a week before that received COVID booster.  He started having nausea and vomiting at times blood-tinged after receiving the flu vaccine.  He had increasing weakness and confusion.  He had to crawl to move around and patient's friend brought in to the ER.  CT head was unremarkable and ammonia level was normal.  He was noted to be  dehydrated and was given fluid boluses and IV thiamine.  Acute encephalopathy was thought to be medication induced.  Baclofen and gabapentin were held during hospitalization.  On discharge, gabapentin was reduced from 300 mg 3 times a day to 200 mg 3 times a day and baclofen was discontinued.  He had heme positive stools.  Endoscopy done on 2020 showed duodenal ulcer/esophageal ulcers and gastritis.  EGD done during this hospitalization showed severe grade B erosive esophagitis with ulceration in the distal esophagus.  Biopsies were taken.  GI recommended Protonix 40 mg twice a day x2 months then daily.  He will follow-up with GI, Dr. Alessandra Bevels, in 2 months with repeat EGD in 3 months to document healing of esophagitis.  He was seen today in his room. He stated that he has history of lumbar disc rupture. He was seen ambulating from bathroom to his bed independently. He rated his lower back pain as 3/10.    Past Medical History:  Diagnosis Date   Arthritis    Depression    Past Surgical History:  Procedure Laterality Date   BACK SURGERY     BIOPSY  08/12/2019   Procedure: BIOPSY;  Surgeon: Otis Brace, MD;  Location: [facility];  Service: Gastroenterology;;   BIOPSY  09/04/2021   Procedure: BIOPSY;  Surgeon: Otis Brace, MD;  Location: [facility];  Service: Gastroenterology;;   ESOPHAGOGASTRODUODENOSCOPY N/A 09/04/2021   Procedure: ESOPHAGOGASTRODUODENOSCOPY (EGD);  Surgeon: Otis Brace, MD;  Location: [facility];  Service: Gastroenterology;  Laterality: N/A;   ESOPHAGOGASTRODUODENOSCOPY (EGD) WITH PROPOFOL N/A 08/12/2019   Procedure: ESOPHAGOGASTRODUODENOSCOPY (EGD)  WITH PROPOFOL;  Surgeon: Otis Brace, MD;  Location: [facility];  Service: Gastroenterology;  Laterality: N/A;   EYE SURGERY Bilateral    cataracts   KNEE ARTHROSCOPY Left    LUMBAR LAMINECTOMY/DECOMPRESSION MICRODISCECTOMY N/A 06/27/2019   Procedure: L3-4 HEMILAMINECTOMY DISCECTOMY;  Surgeon: Deetta Perla, MD;  Location: [facility];  Service: Neurosurgery;  Laterality: N/A;    Allergies  Allergen Reactions   Codeine Nausea Only    Outpatient Encounter Medications as of 09/10/2021  Medication Sig   acetaminophen (TYLENOL) 500 MG tablet Take 500 mg by mouth every 8 (eight) hours as needed (pain).   feeding supplement (ENSURE ENLIVE / ENSURE PLUS) LIQD Take 237 mLs by mouth 2 (two) times daily between meals.   folic acid (FOLVITE) 1 MG tablet Take 1 tablet (1 mg total) by mouth daily.   gabapentin (NEURONTIN) 100 MG capsule Take 2 capsules (200 mg total) by mouth 3 (three) times daily.   Multiple Vitamin (MULTIVITAMIN WITH MINERALS) TABS tablet Take 1 tablet by mouth daily.   ondansetron (ZOFRAN) 4 MG tablet Take 1 tablet (4 mg total) by mouth every 6 (six) hours as needed for nausea.   pantoprazole (PROTONIX) 40 MG tablet Take 1 tablet (40 mg total) by mouth 2 (two) times daily before a meal for 60 days, THEN 1 tablet (40 mg total) daily.   sertraline (ZOLOFT) 100 MG tablet Take 100 mg by mouth in the morning and at bedtime.   thiamine 100 MG tablet Take 1 tablet (100 mg total) by mouth daily.   No facility-administered encounter medications on file as of 09/10/2021.    Review of Systems  GENERAL: No change in appetite, no fatigue, no weight changes, no fever or chills MOUTH and THROAT: Denies oral discomfort, gingival pain or bleeding RESPIRATORY: no cough, SOB, DOE, wheezing, hemoptysis CARDIAC: No chest pain, edema or palpitations GI: No abdominal pain, diarrhea, constipation, heart burn GU: Denies dysuria, frequency, hematuria, incontinence, or discharge NEUROLOGICAL: Denies dizziness, syncope, numbness, or headache PSYCHIATRIC: Denies feelings of depression or anxiety. No report of hallucinations, insomnia, paranoia, or agitation   Immunization History  Administered Date(s) Administered   Pneumococcal Polysaccharide-23 09/20/2013   Td 02/17/2012   Zoster Recombinat  (Shingrix) 08/30/2018, 12/01/2018   Pertinent  Health Maintenance Due  Topic Date Due   COLONOSCOPY (Pts 45-98yrs Insurance coverage will need to be confirmed)  Never done   INFLUENZA VACCINE  06/17/2021   Fall Risk 09/07/2021 09/07/2021 09/08/2021 09/08/2021 09/09/2021  Patient Fall Risk Level High fall risk High fall risk High fall risk High fall risk High fall risk     Vitals:   09/10/21 1053  BP: 129/78  Pulse: 87  Resp: 18  Temp: (!) 96.6 F (35.9 C)  Weight: 169 lb (76.7 kg)  Height: 5\' 9"  (1.753 m)   Body mass index is 24.96 kg/m.  Physical Exam  GENERAL APPEARANCE: Well nourished. In no acute distress. Normal body habitus SKIN:  Skin is warm and dry.  MOUTH and THROAT: Lips are without lesions. Oral mucosa is moist and without lesions.  RESPIRATORY: Breathing is even & unlabored, BS CTAB CARDIAC: RRR, no murmur,no extra heart sounds, no edema GI: Abdomen soft, normal BS, no masses, no tenderness NEUROLOGICAL: There is no tremor. Speech is clear. Alert and oriented X 3. PSYCHIATRIC:  Affect and behavior are appropriate  Labs reviewed: Recent Labs    09/02/21 0008 09/02/21 0349 09/05/21 0336 09/06/21 0330 09/08/21 0318  NA  --    < >  135 135 137  K  --    < > 3.8 3.6 3.8  CL  --    < > 101 100 100  CO2  --    < > 26 26 29   GLUCOSE  --    < > 106* 108* 81  BUN  --    < > 15 26* 33*  CREATININE  --    < > 0.87 0.78 1.15  CALCIUM  --    < > 9.4 9.3 9.4  MG 2.6*   < > 2.0 2.3 2.1  PHOS 2.0*  --   --   --   --    < > = values in this interval not displayed.   Recent Labs    09/01/21 1710 09/02/21 0349 09/03/21 0331  AST 29 25 19   ALT 23 20 15   ALKPHOS 64 61 53  BILITOT 1.9* 1.7* 1.4*  PROT 7.5 7.1 5.5*  ALBUMIN 4.3 4.1 3.2*   Recent Labs    09/01/21 1710 09/02/21 0349 09/03/21 0331 09/05/21 0336 09/06/21 0330 09/08/21 0318  WBC 13.2* 10.0   < > 8.4 8.3 10.9*  NEUTROABS 11.0* 7.6  --   --   --   --   HGB 16.4 15.9   < > 14.3 15.0 15.0   HCT 50.6 49.2   < > 43.5 45.6 46.1  MCV 90.8 91.8   < > 89.5 90.5 91.3  PLT 218 185   < > 140* 152 198   < > = values in this interval not displayed.   Lab Results  Component Value Date   TSH 0.432 09/02/2021   No results found for: HGBA1C No results found for: CHOL, HDL, LDLCALC, LDLDIRECT, TRIG, CHOLHDL  Significant Diagnostic Results in last 30 days:  DG Chest 2 View  Result Date: 09/01/2021 CLINICAL DATA:  Altered mental status EXAM: CHEST - 2 VIEW COMPARISON:  None. FINDINGS: Heart and mediastinal contours are within normal limits. No focal opacities or effusions. No acute bony abnormality. IMPRESSION: No active cardiopulmonary disease. Electronically Signed   By: Rolm Baptise M.D.   On: 09/01/2021 17:26   CT Head Wo Contrast  Result Date: 09/01/2021 CLINICAL DATA:  Head trauma, minor (Age >= 65y) EXAM: CT HEAD WITHOUT CONTRAST TECHNIQUE: Contiguous axial images were obtained from the base of the skull through the vertex without intravenous contrast. COMPARISON:  None. FINDINGS: Brain: There is no acute intracranial hemorrhage, mass effect, or edema. Gray-white differentiation is preserved. There is no extra-axial fluid collection. Prominence of the ventricles and sulci reflects mild parenchymal volume loss. Patchy and confluent hypoattenuation in the supratentorial white matter is nonspecific but probably reflects moderate chronic microvascular ischemic changes. Vascular: There is atherosclerotic calcification at the skull base. Skull: Calvarium is unremarkable. Sinuses/Orbits: No acute finding. Other: None. IMPRESSION: No evidence of acute intracranial injury. Electronically Signed   By: Macy Mis M.D.   On: 09/01/2021 18:03   MR BRAIN WO CONTRAST  Result Date: 09/02/2021 CLINICAL DATA:  Mental status change, unknown cause. Additional history provided: Nausea and vomiting for 1 day after receiving the flu vaccine, subsequent weakness, confusion. EXAM: MRI HEAD WITHOUT CONTRAST  TECHNIQUE: Multiplanar, multiecho pulse sequences of the brain and surrounding structures were obtained without intravenous contrast. COMPARISON:  Head CT 09/01/2021. FINDINGS: Brain: Intermittently motion degraded examination, limiting evaluation. Most notably, there is severe motion degradation of the axial T2 TSE sequence, moderate motion degradation of the axial SWI sequence, moderate motion degradation of the axial  T1 weighted sequence and moderate motion degradation of the coronal T2 TSE sequence. Mild parenchymal atrophy. Moderate multifocal T2 FLAIR hyperintense signal abnormality within the cerebral white matter, nonspecific but compatible with chronic small vessel ischemic disease. Chronic small vessel ischemic changes are also present within the bilateral basal ganglia and within the pons. There is no acute infarct. No evidence of an intracranial mass. No chronic intracranial blood products. No extra-axial fluid collection. No midline shift. Vascular: Maintained flow voids within the proximal large arterial vessels. Skull and upper cervical spine: No focal suspicious marrow lesion. Sinuses/Orbits: Visualized orbits show no acute finding. Other: Trace fluid within the left mastoid air cells. IMPRESSION: Motion degraded exam, as described. No evidence of acute intracranial abnormality. Moderate chronic small vessel ischemic changes within the cerebral white matter. Chronic small vessel ischemic changes are also present within the bilateral basal ganglia and within the pons. Mild generalized parenchymal atrophy. Electronically Signed   By: Kellie Simmering D.O.   On: 09/02/2021 12:45   CT RENAL STONE STUDY  Result Date: 09/02/2021 CLINICAL DATA:  Back pain, flank pain EXAM: CT ABDOMEN AND PELVIS WITHOUT CONTRAST TECHNIQUE: Multidetector CT imaging of the abdomen and pelvis was performed following the standard protocol without IV contrast. COMPARISON:  08/10/2019 FINDINGS: Lower chest: Hiatal hernia. No  pleural or pericardial effusion. Coarse subpleural linear opacities posteriorly in both lung bases. Hepatobiliary: Stable small hepatic cysts. No calcified stones. The gallbladder is nondistended. No biliary ductal dilatation. Pancreas: Unremarkable. No pancreatic ductal dilatation or surrounding inflammatory changes. Spleen: Normal in size without focal abnormality. Adrenals/Urinary Tract: Upper pole right and lower pole left renal lesions possibly cysts, incompletely characterized. No urolithiasis or hydronephrosis. Urinary bladder is physiologically distended. Stomach/Bowel: Moderate hiatal hernia involving much of the fundus. The stomach is nondilated. The small bowel is non distended. Normal appendix. The colon is nondilated, unremarkable. Vascular/Lymphatic: Scattered aortoiliac calcified plaque without aneurysm. No abdominal or pelvic adenopathy. Reproductive: Mild prostate enlargement with central coarse calcifications. Other: Left pelvic phleboliths.  No ascites.  No free air. Musculoskeletal: Multilevel lower thoracic and lumbar spondylitic change. No fracture or worrisome bone lesion. IMPRESSION: 1. No acute findings.  No urolithiasis. 2. Hiatal hernia 3.  Aortic Atherosclerosis (ICD10-170.0). Electronically Signed   By: Lucrezia Europe M.D.   On: 09/02/2021 12:26    Assessment/Plan   1. Acute encephalopathy -Thought to be medication induced.  Gabapentin and baclofen were held during hospitalization.  He received IV thiamine in the hospital and discharged with oral thiamine -- Gabapentin teen was reduced from 300 mg 3 times a day to 200 mg 3 times a day -    Baclofen was discontinued -    cognition improved  2. Upper GI bleeding -    Endoscopy done on 2020 showed duodenal ulcer/esophageal ulcers and gastritis.  EGD done during this hospitalization showed severe grade B erosive esophagitis with ulceration in the distal esophagus.  Biopsies were taken.  GI recommended Protonix 40 mg twice a day x2  months then daily.  He will follow-up with GI, Dr. Alessandra Bevels, in 2 months with repeat EGD in 3 months to document healing of esophagitis.  3. Hypokalemia Lab Results  Component Value Date   K 3.8 09/08/2021   -Was supplemented in the hospital  4. Chronic low back pain with right-sided sciatica, unspecified back pain laterality -- rated his pain as 3/10, Gabapentin was reduced from 300 mg 3 times a day to 200 mg 3 times a day -    Baclofen  was discontinued  5. Major depression, recurrent, chronic (HCC) -  mood is stable, continue Zoloft  6.  Generalized weakness -    for PT and OT, for therapeutic strengthening exercises     Family/ staff Communication: Discussed plan of care with resident and charge nurse.  Labs/tests ordered:   for CBC and BMP on 09/17/21  Goals of care:   Short-term care   Durenda Age, DNP, MSN, FNP-BC [facility] [phone] (Monday-Friday 8:00 a.m. - 5:00 p.m.) [phone] (after hours)

## 2021-09-11 ENCOUNTER — Encounter: Payer: Self-pay | Admitting: Internal Medicine

## 2021-09-11 ENCOUNTER — Non-Acute Institutional Stay (SKILLED_NURSING_FACILITY): Payer: Medicare Other | Admitting: Internal Medicine

## 2021-09-11 DIAGNOSIS — I700 Atherosclerosis of aorta: Secondary | ICD-10-CM

## 2021-09-11 DIAGNOSIS — E440 Moderate protein-calorie malnutrition: Secondary | ICD-10-CM | POA: Diagnosis not present

## 2021-09-11 DIAGNOSIS — R531 Weakness: Secondary | ICD-10-CM

## 2021-09-11 DIAGNOSIS — K922 Gastrointestinal hemorrhage, unspecified: Secondary | ICD-10-CM | POA: Diagnosis not present

## 2021-09-11 DIAGNOSIS — G9340 Encephalopathy, unspecified: Secondary | ICD-10-CM

## 2021-09-11 NOTE — Assessment & Plan Note (Addendum)
SLUMS is a mental status assessment of possible dementia published by the [employer]. The test differentiates between high school or less education level in reference to presence or absence of dementia. For high school education a score of 27-30 is normal, 21-26 is minimal neurocognitive deficit and 1-20 suggests dementia. With less than a high school education similar scoring is 25-30, 20-24, and 1-19. He has BA  In Ed. Score 09/10/21 : 26/30; mild neurocognitive deficit.

## 2021-09-11 NOTE — Assessment & Plan Note (Signed)
Multifactorial debilitation; PT/OT at SNF.

## 2021-09-11 NOTE — Assessment & Plan Note (Signed)
Triggers for reflux discussed to include the "aspirin family", alcohol, peppermint, tobacco, and caffeine (coffee, tea, cola, chocolate).

## 2021-09-11 NOTE — Progress Notes (Signed)
NURSING HOME LOCATION:  [facility] ROOM NUMBER:  [id number]  CODE STATUS:  Full Code  PCP: [facility]; he indicates he plans to transfer to a new practice  This is a comprehensive admission note to this SNFperformed on this date less than 30 days from date of admission. Included are preadmission medical/surgical history; reconciled medication list; family history; social history and comprehensive review of systems.  Corrections and additions to the records were documented. Comprehensive physical exam was also performed. Additionally a clinical summary was entered for each active diagnosis pertinent to this admission in the Problem List to enhance continuity of care.  HPI: He was hospitalized 10/16 - 09/09/2021 admitted with increasing weakness and confusion.  Allegedly symptoms began after he had taken a Flu shot.  He did have nausea and vomiting with some blood tinge to the vomitus.  He states that he did not appear at an event and his friends came to his home and learned he had fallen & arranged the transfer to the hospital.  Clinically he was dehydrated at admission.  Encephalopathy work-up was reported as unrevealing.  Actually 10/16 CT of the head without contrast revealed patchy and confluent hypoattenuation in the supratentorial white matter which was nonspecific but probably reflected moderate chronic microvascular ischemic changes.  Atherosclerotic calcification of the skull base was also noted.  It was felt that acute metabolic encephalopathy was multifactorial in the context of possible cognitive dysfunction at baseline.  There was a question this might be related to gabapentin & baclofen prescribed for chronic back pain; these were held at admission.  He received IV thiamine while in the hospital and this was to be continued as maintenance oral therapy.  High anion gap metabolic acidosis resolved with IV fluids as did hypokalemia. RPR, B12, ammonia, and TSH were  normal. Moderate protein/caloric malnutrition was questioned.Ensure supplements were ordered The coffee-ground emesis was associated with heme positive stools.  His most current EGD in 2020 had revealed duodenal ulcer/esophageal ulcers and gastritis.  Repeat EGD by Dr. Alessandra Bevels revealed severe grade B erosive esophagitis with ulceration in the distal esophagus.  Protonix 40 mg twice daily for 2 months was to be followed by maintenance 40 mg daily.  He was to be seen in GI clinic 2 months following discharge with a repeat EGD in 3 months to document healing of the esophagitis. Because of generalized weakness and debility , it was recommended he be discharged to SNF for PT/OT. At discharge gabapentin was resumed but at a reduced dosage of 200 mg 3 times daily from the prior dose 300 mg 3 times daily.  Baclofen was not renewed.  Past medical and surgical history: Includes diagnoses of degenerative joint disease/osteoarthritis, prior GI bleed and depression. Procedures and surgeries include pilonidal cyst surgery remotely, EGD, lumbar laminectomy/microdiscectomy, and left knee arthroscopy.  Social history: Social alcohol user; former smoker.  He states that he quit at least 20 years ago.  He has no alcohol in the home but will have a drink when he goes out to eat.  Family history: Mother had a stroke, father and maternal grandfather had lung cancer.  He believes his paternal grandfather died of an anaphylactoid reaction to penicillin injection in the 89s..   Review of systems: He exhibited intermittent word retrieval issues.  When I inquired about his PCP he kept referring to Dr. Alessandra Bevels, the Gastroenterologist.  He states that he had had a reaction to a flu shot on October 23  (  he was still in the hospital that day) which resulted in nausea and vomiting and bright red emesis.  He states the nausea & vomiting resolved within 24 hours but he had fallen and was profoundly weak.  He states he has chronic  weakness in the right lower extremity related to surgery on his back in 2020 for ruptured disc.  He describes slight peripheral edema as slight depression at the sock line.  He denies any other cardiopulmonary symptoms.  He does have occasional dysphagia which he relates to the hiatal hernia and this can be associated with chest pain across the mid chest.  He states that he wears Depends because of some urinary incontinence.  Constitutional: No fever, significant weight change, fatigue  Eyes: No redness, discharge, pain, vision change ENT/mouth: No nasal congestion, purulent discharge, earache, change in hearing, sore throat  Cardiovascular: No  palpitations, paroxysmal nocturnal dyspnea, claudication Respiratory: No cough, sputum production, hemoptysis, DOE, significant snoring, apnea  Gastrointestinal: No heartburn, abdominal pain, nausea /vomiting, rectal bleeding,melena Genitourinary: No dysuria, hematuria, pyuria Dermatologic: No rash, pruritus, change in appearance of skin Neurologic: No dizziness, headache, syncope, seizures, numbness, tingling Psychiatric: No significant anxiety, depression, insomnia, anorexia Endocrine: No change in hair/skin/nails, excessive thirst, excessive hunger, excessive urination  Hematologic/lymphatic: No significant bruising, lymphadenopathy, abnormal bleeding Allergy/immunology: No itchy/watery eyes, significant sneezing, urticaria, angioedema  Physical exam:  Pertinent or positive findings: He is somewhat hard of hearing.  Hair is disheveled.  He is Geneticist, molecular.  Slight S4 is present.  Breath sounds are decreased with insignificant minor scattered rales.  Posterior tibial pulses are strong; dorsalis pedis pulses are decreased. Strength to opposition in the upper extremities appears stronger than in the lower extremities.  He exhibited intention tremor of the hands.  General appearance:  no acute distress, increased work of breathing is present.   Lymphatic: No  lymphadenopathy about the head, neck, axilla. Eyes: No conjunctival inflammation or lid edema is present. There is no scleral icterus. Ears:  External ear exam shows no significant lesions or deformities.   Nose:  External nasal examination shows no deformity or inflammation. Nasal mucosa are pink and moist without lesions, exudates Oral exam: Lips and gums are healthy appearing.There is no oropharyngeal erythema or exudate. Neck:  No thyromegaly, masses, tenderness noted.    Heart:  Normal rate and regular rhythm without gallop, murmur, click, rub.  Lungs:  without wheezes, rhonchi,  rubs. Abdomen: Bowel sounds are normal.  Abdomen is soft and nontender with no organomegaly, hernias, masses. GU: Deferred  Extremities:  No cyanosis, clubbing, edema. Neurologic exam:  Balance, Rhomberg, finger to nose testing could not be completed due to clinical state Skin: Warm & dry w/o tenting. No significant lesions or rash.  See clinical summary under each active problem in the Problem List with associated updated therapeutic plan

## 2021-09-11 NOTE — Patient Instructions (Signed)
See assessment and plan under each diagnosis in the problem list and acutely for this visit 

## 2021-09-12 DIAGNOSIS — I700 Atherosclerosis of aorta: Secondary | ICD-10-CM | POA: Insufficient documentation

## 2021-09-12 NOTE — Assessment & Plan Note (Addendum)
Diabetes risk and lipid status will be updated.

## 2021-09-12 NOTE — Assessment & Plan Note (Addendum)
Clinically he does not demonstrate significant malnutrition and there is a discrepancy among the labs.  The values which which would support this diagnosis were actually outliers.  This should be reevaluated following he is discharged from the SNF.

## 2021-09-16 LAB — LIPID PANEL
Cholesterol: 182 (ref 0–200)
HDL: 48 (ref 35–70)
LDL Cholesterol: 118
Triglycerides: 81 (ref 40–160)

## 2021-09-16 LAB — HEMOGLOBIN A1C: Hemoglobin A1C: 5

## 2021-09-18 ENCOUNTER — Encounter: Payer: Self-pay | Admitting: Adult Health

## 2021-09-18 ENCOUNTER — Non-Acute Institutional Stay (SKILLED_NURSING_FACILITY): Payer: Medicare Other | Admitting: Adult Health

## 2021-09-18 DIAGNOSIS — M5441 Lumbago with sciatica, right side: Secondary | ICD-10-CM | POA: Diagnosis not present

## 2021-09-18 DIAGNOSIS — G8929 Other chronic pain: Secondary | ICD-10-CM

## 2021-09-18 DIAGNOSIS — R531 Weakness: Secondary | ICD-10-CM

## 2021-09-18 DIAGNOSIS — F339 Major depressive disorder, recurrent, unspecified: Secondary | ICD-10-CM | POA: Diagnosis not present

## 2021-09-18 DIAGNOSIS — K922 Gastrointestinal hemorrhage, unspecified: Secondary | ICD-10-CM

## 2021-09-18 LAB — CBC AND DIFFERENTIAL
HCT: 38 — AB (ref 41–53)
Hemoglobin: 12.6 — AB (ref 13.5–17.5)
Platelets: 157 (ref 150–399)
WBC: 5.5

## 2021-09-18 LAB — BASIC METABOLIC PANEL
BUN: 16 (ref 4–21)
CO2: 23 — AB (ref 13–22)
Chloride: 111 — AB (ref 99–108)
Creatinine: 0.9 (ref 0.6–1.3)
Glucose: 89
Potassium: 4 (ref 3.4–5.3)
Sodium: 147 (ref 137–147)

## 2021-09-18 LAB — COMPREHENSIVE METABOLIC PANEL: Calcium: 8.9 (ref 8.7–10.7)

## 2021-09-18 LAB — CBC: RBC: 4.27 (ref 3.87–5.11)

## 2021-09-18 NOTE — Progress Notes (Addendum)
Location:  [facility] Room Number: 295-J Place of Service:  SNF (31) Provider:  Durenda Age, DNP, FNP-BC  Patient Care Team: [place], [place] as PCP - General (Family Medicine)  Extended Emergency Contact Information Primary Emergency Contact: [place], [place] [address] [name] of [place] Phone: [phone] Relation: Friend Secondary Emergency Contact: [name], [place] of [employer] Phone: [phone] Relation: Friend Preferred language: English Interpreter needed? No  Code Status:  Full Code   Goals of care: Advanced Directive information Advanced Directives 09/18/2021  Does Patient Have a Medical Advance Directive? Yes  Type of Paramedic of [place];Living will  Does patient want to make changes to medical advance directive? No - Patient declined  Copy of [place] in Chart? Yes - validated most recent copy scanned in chart (See row information)  Would patient like information on creating a medical advance directive? -     Chief Complaint  Patient presents with   Acute Visit    Short-term care follow-up    HPI:  Pt is a 75 y.o. male seen today for a short-term rehabilitation visit. He is currently having PT and OT. He is seen walking around the hallway with his walker. He takes Gabapentin 200 mg TID for chronic low back pain. No reported bloody stools. He takes Protonix 40 mg twice a day for upper GI bleed. PHQ-9 is 0, no depressed mood. He takes Zoloft 100 mg at bedtime for depression.   Past Medical History:  Diagnosis Date   Arthritis    Depression    Past Surgical History:  Procedure Laterality Date   BACK SURGERY     BIOPSY  08/12/2019   Procedure: BIOPSY;  Surgeon: Otis Brace, MD;  Location: [facility];  Service: Gastroenterology;;   BIOPSY  09/04/2021   Procedure: BIOPSY;  Surgeon:  Otis Brace, MD;  Location: [facility];  Service: Gastroenterology;;   ESOPHAGOGASTRODUODENOSCOPY N/A 09/04/2021   Procedure: ESOPHAGOGASTRODUODENOSCOPY (EGD);  Surgeon: Otis Brace, MD;  Location: [facility];  Service: Gastroenterology;  Laterality: N/A;   ESOPHAGOGASTRODUODENOSCOPY (EGD) WITH PROPOFOL N/A 08/12/2019   Procedure: ESOPHAGOGASTRODUODENOSCOPY (EGD) WITH PROPOFOL;  Surgeon: Otis Brace, MD;  Location: [facility];  Service: Gastroenterology;  Laterality: N/A;   EYE SURGERY Bilateral    cataracts   KNEE ARTHROSCOPY Left    LUMBAR LAMINECTOMY/DECOMPRESSION MICRODISCECTOMY N/A 06/27/2019   Procedure: L3-4 HEMILAMINECTOMY DISCECTOMY;  Surgeon: Deetta Perla, MD;  Location: [facility];  Service: Neurosurgery;  Laterality: N/A;    Allergies  Allergen Reactions   Codeine Nausea Only    Outpatient Encounter Medications as of 09/18/2021  Medication Sig   acetaminophen (TYLENOL) 500 MG tablet Take 500 mg by mouth every 8 (eight) hours as needed (pain).   feeding supplement (ENSURE ENLIVE / ENSURE PLUS) LIQD Take 237 mLs by mouth 2 (two) times daily between meals.   folic acid (FOLVITE) 1 MG tablet Take 1 tablet (1 mg total) by mouth daily.   gabapentin (NEURONTIN) 100 MG capsule Take 2 capsules (200 mg total) by mouth 3 (three) times daily.   Multiple Vitamin (MULTIVITAMIN WITH MINERALS) TABS tablet Take 1 tablet by mouth daily.   ondansetron (ZOFRAN) 4 MG tablet Take 1 tablet (4 mg total) by mouth every 6 (six) hours as needed for nausea.   pantoprazole (PROTONIX) 40 MG tablet Take 1 tablet (40 mg  total) by mouth 2 (two) times daily before a meal for 60 days, THEN 1 tablet (40 mg total) daily.   sertraline (ZOLOFT) 100 MG tablet Take 100 mg by mouth in the morning and at bedtime.   thiamine 100 MG tablet Take 1 tablet (100 mg total) by mouth daily.   No facility-administered encounter medications on file as of 09/18/2021.    Review of Systems  GENERAL: No change  in appetite, no fatigue, no weight changes, no fever or chills  MOUTH and THROAT: Denies oral discomfort, gingival pain or bleeding RESPIRATORY: no cough, SOB, DOE, wheezing, hemoptysis CARDIAC: No chest pain, edema or palpitations GI: No abdominal pain, diarrhea, constipation, heart burn, nausea or vomiting GU: Denies dysuria, frequency, hematuria, incontinence, or discharge NEUROLOGICAL: Denies dizziness, syncope, numbness, or headache PSYCHIATRIC: Denies feelings of depression or anxiety. No report of hallucinations, insomnia, paranoia, or agitation   Immunization History  Administered Date(s) Administered   Moderna SARS-COV2 Booster Vaccination 09/10/2020   Moderna Sars-Covid-2 Vaccination 12/31/2019, 01/28/2020   Pneumococcal Polysaccharide-23 09/20/2013   Td 02/17/2012   Zoster Recombinat (Shingrix) 08/30/2018, 12/01/2018   Pertinent  Health Maintenance Due  Topic Date Due   COLONOSCOPY (Pts 45-57yrs Insurance coverage will need to be confirmed)  Never done   INFLUENZA VACCINE  06/17/2021   Fall Risk 09/07/2021 09/07/2021 09/08/2021 09/08/2021 09/09/2021  Patient Fall Risk Level High fall risk High fall risk High fall risk High fall risk High fall risk     Vitals:   09/18/21 1619  BP: 129/75  Pulse: 63  Resp: 20  Temp: (!) 97.5 F (36.4 C)  Weight: 185 lb 9.6 oz (84.2 kg)  Height: 5\' 9"  (1.753 m)   Body mass index is 27.41 kg/m.  Physical Exam  GENERAL APPEARANCE: Well nourished. In no acute distress. Normal body habitus SKIN:  Skin is warm and dry.  MOUTH and THROAT: Lips are without lesions. Oral mucosa is moist and without lesions.  RESPIRATORY: Breathing is even & unlabored, BS CTAB CARDIAC: RRR, no murmur,no extra heart sounds, no edema GI: Abdomen soft, normal BS, no masses, no tenderness EXTREMITIES:  Able to move X 4 extremities NEUROLOGICAL: There is no tremor. Speech is clear. Alert and oriented X 3. PSYCHIATRIC:  Affect and behavior are  appropriate  Labs reviewed: Recent Labs    09/02/21 0008 09/02/21 0349 09/05/21 0336 09/06/21 0330 09/08/21 0318 09/18/21 0000  NA  --    < > 135 135 137 147  K  --    < > 3.8 3.6 3.8 4.0  CL  --    < > 101 100 100 111*  CO2  --    < > 26 26 29  23*  GLUCOSE  --    < > 106* 108* 81  --   BUN  --    < > 15 26* 33* 16  CREATININE  --    < > 0.87 0.78 1.15 0.9  CALCIUM  --    < > 9.4 9.3 9.4 8.9  MG 2.6*   < > 2.0 2.3 2.1  --   PHOS 2.0*  --   --   --   --   --    < > = values in this interval not displayed.   Recent Labs    09/01/21 1710 09/02/21 0349 09/03/21 0331  AST 29 25 19   ALT 23 20 15   ALKPHOS 64 61 53  BILITOT 1.9* 1.7* 1.4*  PROT 7.5 7.1 5.5*  ALBUMIN 4.3  4.1 3.2*   Recent Labs    09/01/21 1710 09/02/21 0349 09/03/21 0331 09/05/21 0336 09/06/21 0330 09/08/21 0318 09/18/21 0000  WBC 13.2* 10.0   < > 8.4 8.3 10.9* 5.5  NEUTROABS 11.0* 7.6  --   --   --   --   --   HGB 16.4 15.9   < > 14.3 15.0 15.0 12.6*  HCT 50.6 49.2   < > 43.5 45.6 46.1 38*  MCV 90.8 91.8   < > 89.5 90.5 91.3  --   PLT 218 185   < > 140* 152 198 157   < > = values in this interval not displayed.   Lab Results  Component Value Date   TSH 0.432 09/02/2021   Lab Results  Component Value Date   HGBA1C 5.0 09/16/2021   Lab Results  Component Value Date   CHOL 182 09/16/2021   HDL 48 09/16/2021   LDLCALC 118 09/16/2021   TRIG 81 09/16/2021    Significant Diagnostic Results in last 30 days:  DG Chest 2 View  Result Date: 09/01/2021 CLINICAL DATA:  Altered mental status EXAM: CHEST - 2 VIEW COMPARISON:  None. FINDINGS: Heart and mediastinal contours are within normal limits. No focal opacities or effusions. No acute bony abnormality. IMPRESSION: No active cardiopulmonary disease. Electronically Signed   By: Rolm Baptise M.D.   On: 09/01/2021 17:26   CT Head Wo Contrast  Result Date: 09/01/2021 CLINICAL DATA:  Head trauma, minor (Age >= 65y) EXAM: CT HEAD WITHOUT CONTRAST  TECHNIQUE: Contiguous axial images were obtained from the base of the skull through the vertex without intravenous contrast. COMPARISON:  None. FINDINGS: Brain: There is no acute intracranial hemorrhage, mass effect, or edema. Gray-white differentiation is preserved. There is no extra-axial fluid collection. Prominence of the ventricles and sulci reflects mild parenchymal volume loss. Patchy and confluent hypoattenuation in the supratentorial white matter is nonspecific but probably reflects moderate chronic microvascular ischemic changes. Vascular: There is atherosclerotic calcification at the skull base. Skull: Calvarium is unremarkable. Sinuses/Orbits: No acute finding. Other: None. IMPRESSION: No evidence of acute intracranial injury. Electronically Signed   By: Macy Mis M.D.   On: 09/01/2021 18:03   MR BRAIN WO CONTRAST  Result Date: 09/02/2021 CLINICAL DATA:  Mental status change, unknown cause. Additional history provided: Nausea and vomiting for 1 day after receiving the flu vaccine, subsequent weakness, confusion. EXAM: MRI HEAD WITHOUT CONTRAST TECHNIQUE: Multiplanar, multiecho pulse sequences of the brain and surrounding structures were obtained without intravenous contrast. COMPARISON:  Head CT 09/01/2021. FINDINGS: Brain: Intermittently motion degraded examination, limiting evaluation. Most notably, there is severe motion degradation of the axial T2 TSE sequence, moderate motion degradation of the axial SWI sequence, moderate motion degradation of the axial T1 weighted sequence and moderate motion degradation of the coronal T2 TSE sequence. Mild parenchymal atrophy. Moderate multifocal T2 FLAIR hyperintense signal abnormality within the cerebral white matter, nonspecific but compatible with chronic small vessel ischemic disease. Chronic small vessel ischemic changes are also present within the bilateral basal ganglia and within the pons. There is no acute infarct. No evidence of an  intracranial mass. No chronic intracranial blood products. No extra-axial fluid collection. No midline shift. Vascular: Maintained flow voids within the proximal large arterial vessels. Skull and upper cervical spine: No focal suspicious marrow lesion. Sinuses/Orbits: Visualized orbits show no acute finding. Other: Trace fluid within the left mastoid air cells. IMPRESSION: Motion degraded exam, as described. No evidence of acute intracranial abnormality. Moderate  chronic small vessel ischemic changes within the cerebral white matter. Chronic small vessel ischemic changes are also present within the bilateral basal ganglia and within the pons. Mild generalized parenchymal atrophy. Electronically Signed   By: Kellie Simmering D.O.   On: 09/02/2021 12:45   CT RENAL STONE STUDY  Result Date: 09/02/2021 CLINICAL DATA:  Back pain, flank pain EXAM: CT ABDOMEN AND PELVIS WITHOUT CONTRAST TECHNIQUE: Multidetector CT imaging of the abdomen and pelvis was performed following the standard protocol without IV contrast. COMPARISON:  08/10/2019 FINDINGS: Lower chest: Hiatal hernia. No pleural or pericardial effusion. Coarse subpleural linear opacities posteriorly in both lung bases. Hepatobiliary: Stable small hepatic cysts. No calcified stones. The gallbladder is nondistended. No biliary ductal dilatation. Pancreas: Unremarkable. No pancreatic ductal dilatation or surrounding inflammatory changes. Spleen: Normal in size without focal abnormality. Adrenals/Urinary Tract: Upper pole right and lower pole left renal lesions possibly cysts, incompletely characterized. No urolithiasis or hydronephrosis. Urinary bladder is physiologically distended. Stomach/Bowel: Moderate hiatal hernia involving much of the fundus. The stomach is nondilated. The small bowel is non distended. Normal appendix. The colon is nondilated, unremarkable. Vascular/Lymphatic: Scattered aortoiliac calcified plaque without aneurysm. No abdominal or pelvic  adenopathy. Reproductive: Mild prostate enlargement with central coarse calcifications. Other: Left pelvic phleboliths.  No ascites.  No free air. Musculoskeletal: Multilevel lower thoracic and lumbar spondylitic change. No fracture or worrisome bone lesion. IMPRESSION: 1. No acute findings.  No urolithiasis. 2. Hiatal hernia 3.  Aortic Atherosclerosis (ICD10-170.0). Electronically Signed   By: Lucrezia Europe M.D.   On: 09/02/2021 12:26    Assessment/Plan  1. Upper GI bleeding - no reported bloody stool, continue Protonix  2. Chronic low back pain with right-sided sciatica, unspecified back pain laterality -  stable, continue Gabapentin  3. Major depression, recurrent, chronic (HCC) -  PHQ-9 score 0, ranging in no depressed mood -  continue Sertraline  4. Generalized weakness -  continue PT and OT for therapeutic strengthening exercises     Family/ staff Communication:   Discussed plan of care with resident and charge nurse.  Labs/tests ordered:  None  Goals of care:   Short-term care   Durenda Age, DNP, MSN, FNP-BC [facility] [phone] (Monday-Friday 8:00 a.m. - 5:00 p.m.) [phone] (after hours)

## 2021-09-26 ENCOUNTER — Encounter: Payer: Self-pay | Admitting: Adult Health

## 2021-09-26 ENCOUNTER — Non-Acute Institutional Stay (SKILLED_NURSING_FACILITY): Payer: Medicare Other | Admitting: Adult Health

## 2021-09-26 DIAGNOSIS — K922 Gastrointestinal hemorrhage, unspecified: Secondary | ICD-10-CM

## 2021-09-26 DIAGNOSIS — G9340 Encephalopathy, unspecified: Secondary | ICD-10-CM

## 2021-09-26 DIAGNOSIS — F339 Major depressive disorder, recurrent, unspecified: Secondary | ICD-10-CM

## 2021-09-26 DIAGNOSIS — R531 Weakness: Secondary | ICD-10-CM

## 2021-09-26 DIAGNOSIS — G8929 Other chronic pain: Secondary | ICD-10-CM | POA: Diagnosis not present

## 2021-09-26 DIAGNOSIS — M5441 Lumbago with sciatica, right side: Secondary | ICD-10-CM

## 2021-09-26 MED ORDER — PANTOPRAZOLE SODIUM 40 MG PO TBEC: DELAYED_RELEASE_TABLET | ORAL | 0 refills | Status: DC

## 2021-09-26 MED ORDER — FOLIC ACID 1 MG PO TABS: 1.0000 mg | ORAL_TABLET | Freq: Every day | ORAL | 0 refills | Status: DC

## 2021-09-26 MED ORDER — SERTRALINE HCL 100 MG PO TABS: 100.0000 mg | ORAL_TABLET | Freq: Every day | ORAL | 0 refills | Status: DC

## 2021-09-26 MED ORDER — GABAPENTIN 100 MG PO CAPS: 200.0000 mg | ORAL_CAPSULE | Freq: Three times a day (TID) | ORAL | 0 refills | Status: DC

## 2021-09-26 MED ORDER — THIAMINE HCL 100 MG PO TABS: 100.0000 mg | ORAL_TABLET | Freq: Every day | ORAL | 0 refills | Status: DC

## 2021-09-26 NOTE — Progress Notes (Signed)
Location:  [facility] Room Number: 654-Y Place of Service:  SNF (31) Provider:  Durenda Age, DNP, FNP-BC  Patient Care Team: Pa, [place] as PCP - General (Family Medicine)  Extended Emergency Contact Information Primary Emergency Contact: [place], [place] [address] [name] of [place] Phone: [phone] Relation: Friend Secondary Emergency Contact: [name], [place] of [employer] Phone: [phone] Relation: Friend Preferred language: English Interpreter needed? No  Code Status: Full Code   Goals of care: Advanced Directive information Advanced Directives 09/26/2021  Does Patient Have a Medical Advance Directive? Yes  Type of Advance Directive Living will  Does patient want to make changes to medical advance directive? No - Guardian declined  Copy of [place] in Chart? -  Would patient like information on creating a medical advance directive? -     Chief Complaint  Patient presents with   Hospitalization Follow-up    HPI:  Pt is a 75 y.o. male who is for discharge home on 09/27/21 with Home health PT and OT.   He was admitted to [facility] on 09/09/21 post hospital admission  09/02/23 to 10/24/50f. He has a medical history significant or chronic low back pain, depression and history of GI bleed. Patient stated having flu vaccine a week prior to hospitalization and a week before that, he received COVID-19 booster.  He started having nausea and vomiting at times blood-tinged after receiving the flu vaccine.  He had increasing weakness and confusion.  He had to crawl to move around and patient's friend brought him to the ER.  CT head was unremarkable and ammonia level was normal.  He was noted to be dehydrated and was given fluid boluses and IV thiamine.  Acute encephalopathy was thought to be medication induced.   Baclofen and gabapentin were held during hospitalization.  On discharge, gabapentin was reduced from 300 mg 3 times a day to 200 mg 3 times a day and baclofen was discontinued.  He had heme positive stools.  Endoscopy done 2020 showed duodenal ulcer/esophageal ulcers and gastritis.  EGD done during this hospitalization showed severe grade B erosive esophagitis with ulceration in distal esophagus.  Biopsies were taken.  GI recommended Protonix 40 mg twice a day x2 months then daily.  He will follow-up with GI, Dr. Alessandra Bevels, in 2 months with repeat labs to document healing of esophagitis.  Patient was admitted to this facility for short-term rehabilitation after the patient's recent hospitalization.  Patient has completed SNF rehabilitation and therapy has cleared the patient for discharge.  .    Past Medical History:  Diagnosis Date   Arthritis    Depression    Past Surgical History:  Procedure Laterality Date   BACK SURGERY     BIOPSY  08/12/2019   Procedure: BIOPSY;  Surgeon: Otis Brace, MD;  Location: [facility];  Service: Gastroenterology;;   BIOPSY  09/04/2021   Procedure: BIOPSY;  Surgeon: Otis Brace, MD;  Location: [facility];  Service: Gastroenterology;;   ESOPHAGOGASTRODUODENOSCOPY N/A 09/04/2021   Procedure: ESOPHAGOGASTRODUODENOSCOPY (EGD);  Surgeon: Otis Brace, MD;  Location: [facility];  Service: Gastroenterology;  Laterality: N/A;   ESOPHAGOGASTRODUODENOSCOPY (EGD) WITH PROPOFOL N/A 08/12/2019   Procedure: ESOPHAGOGASTRODUODENOSCOPY (EGD) WITH PROPOFOL;  Surgeon: Otis Brace, MD;  Location: [facility];  Service: Gastroenterology;  Laterality: N/A;   EYE SURGERY Bilateral  cataracts   KNEE ARTHROSCOPY Left    LUMBAR LAMINECTOMY/DECOMPRESSION MICRODISCECTOMY N/A 06/27/2019   Procedure: L3-4 HEMILAMINECTOMY DISCECTOMY;  Surgeon: Deetta Perla, MD;  Location: [facility];  Service: Neurosurgery;  Laterality: N/A;    Allergies  Allergen Reactions    Codeine Nausea Only    Outpatient Encounter Medications as of 09/26/2021  Medication Sig   acetaminophen (TYLENOL) 500 MG tablet Take 500 mg by mouth every 8 (eight) hours as needed (pain).   bisacodyl (DULCOLAX) 10 MG suppository If not relieved by MOM, give 10 mg Bisacodyl suppositiory rectally X 1 dose in 24 hours as needed   feeding supplement (ENSURE ENLIVE / ENSURE PLUS) LIQD Take 237 mLs by mouth 2 (two) times daily between meals.   folic acid (FOLVITE) 1 MG tablet Take 1 tablet (1 mg total) by mouth daily.   gabapentin (NEURONTIN) 100 MG capsule Take 2 capsules (200 mg total) by mouth 3 (three) times daily.   Magnesium Hydroxide (MILK OF MAGNESIA PO) Take by mouth. If no BM in 3 days, give 30 cc Milk of Magnesium p.o. x 1 dose in 24 hours as needed (Do not use standing constipation orders for residents with renal failure CFR less than 30. Contact MD for orders)   Multiple Vitamin (MULTIVITAMIN WITH MINERALS) TABS tablet Take 1 tablet by mouth daily.   ondansetron (ZOFRAN) 4 MG tablet Take 1 tablet (4 mg total) by mouth every 6 (six) hours as needed for nausea.   pantoprazole (PROTONIX) 40 MG tablet Take 1 tablet (40 mg total) by mouth 2 (two) times daily before a meal for 60 days, THEN 1 tablet (40 mg total) daily.   sertraline (ZOLOFT) 100 MG tablet Take 100 mg by mouth in the morning and at bedtime.   Sodium Phosphates (RA SALINE ENEMA RE) If not relieved by Biscodyl suppository, give disposable Saline Enema rectally X 1 dose/24 hrs as needed   thiamine 100 MG tablet Take 1 tablet (100 mg total) by mouth daily.   No facility-administered encounter medications on file as of 09/26/2021.    Review of Systems  GENERAL: No change in appetite, no fatigue, no weight changes, no fever or chills  MOUTH and THROAT: Denies oral discomfort, gingival pain or bleeding RESPIRATORY: no cough, SOB, DOE, wheezing, hemoptysis CARDIAC: No chest pain, edema or palpitations GI: No abdominal pain,  diarrhea, constipation, heart burn, nausea or vomiting GU: Denies dysuria, frequency, hematuria, incontinence, or discharge NEUROLOGICAL: Denies dizziness, syncope, numbness, or headache PSYCHIATRIC: Denies feelings of depression or anxiety. No report of hallucinations, insomnia, paranoia, or agitation    Immunization History  Administered Date(s) Administered   Fluad Quad(high Dose 65+) 08/17/2021   Moderna SARS-COV2 Booster Vaccination 09/10/2020   Moderna Sars-Covid-2 Vaccination 12/31/2019, 01/28/2020   Pneumococcal Polysaccharide-23 09/20/2013   Td 02/17/2012   Zoster Recombinat (Shingrix) 08/30/2018, 12/01/2018   Pertinent  Health Maintenance Due  Topic Date Due   COLONOSCOPY (Pts 45-40yrs Insurance coverage will need to be confirmed)  Never done   INFLUENZA VACCINE  Completed   Fall Risk 09/07/2021 09/07/2021 09/08/2021 09/08/2021 09/09/2021  Patient Fall Risk Level High fall risk High fall risk High fall risk High fall risk High fall risk     Vitals:   09/26/21 1030  BP: 132/89  Pulse: 68  Resp: 20  Temp: (!) 97 F (36.1 C)  Weight: 183 lb 12.8 oz (83.4 kg)  Height: 5\' 9"  (1.753 m)   Body mass index is 27.14 kg/m.  Physical Exam  GENERAL APPEARANCE:  Well nourished. In no acute distress. Normal body habitus SKIN:  Skin is warm and dry.  MOUTH and THROAT: Lips are without lesions. Oral mucosa is moist and without lesions.  RESPIRATORY: Breathing is even & unlabored, BS CTAB CARDIAC: RRR, no murmur,no extra heart sounds, no edema GI: Abdomen soft, normal BS, no masses, no tenderness EXTREMITIES:  Able to move X 4 extremities NEUROLOGICAL: There is no tremor. Speech is clear. Alert and oriented X 3.  PSYCHIATRIC: Affect and behavior are appropriate  Labs reviewed: Recent Labs    09/02/21 0008 09/02/21 0349 09/05/21 0336 09/06/21 0330 09/08/21 0318 09/18/21 0000  NA  --    < > 135 135 137 147  K  --    < > 3.8 3.6 3.8 4.0  CL  --    < > 101 100 100  111*  CO2  --    < > 26 26 29  23*  GLUCOSE  --    < > 106* 108* 81  --   BUN  --    < > 15 26* 33* 16  CREATININE  --    < > 0.87 0.78 1.15 0.9  CALCIUM  --    < > 9.4 9.3 9.4 8.9  MG 2.6*   < > 2.0 2.3 2.1  --   PHOS 2.0*  --   --   --   --   --    < > = values in this interval not displayed.   Recent Labs    09/01/21 1710 09/02/21 0349 09/03/21 0331  AST 29 25 19   ALT 23 20 15   ALKPHOS 64 61 53  BILITOT 1.9* 1.7* 1.4*  PROT 7.5 7.1 5.5*  ALBUMIN 4.3 4.1 3.2*   Recent Labs    09/01/21 1710 09/02/21 0349 09/03/21 0331 09/05/21 0336 09/06/21 0330 09/08/21 0318 09/18/21 0000  WBC 13.2* 10.0   < > 8.4 8.3 10.9* 5.5  NEUTROABS 11.0* 7.6  --   --   --   --   --   HGB 16.4 15.9   < > 14.3 15.0 15.0 12.6*  HCT 50.6 49.2   < > 43.5 45.6 46.1 38*  MCV 90.8 91.8   < > 89.5 90.5 91.3  --   PLT 218 185   < > 140* 152 198 157   < > = values in this interval not displayed.   Lab Results  Component Value Date   TSH 0.432 09/02/2021   Lab Results  Component Value Date   HGBA1C 5.0 09/16/2021   Lab Results  Component Value Date   CHOL 182 09/16/2021   HDL 48 09/16/2021   LDLCALC 118 09/16/2021   TRIG 81 09/16/2021    Significant Diagnostic Results in last 30 days:  DG Chest 2 View  Result Date: 09/01/2021 CLINICAL DATA:  Altered mental status EXAM: CHEST - 2 VIEW COMPARISON:  None. FINDINGS: Heart and mediastinal contours are within normal limits. No focal opacities or effusions. No acute bony abnormality. IMPRESSION: No active cardiopulmonary disease. Electronically Signed   By: Rolm Baptise M.D.   On: 09/01/2021 17:26   CT Head Wo Contrast  Result Date: 09/01/2021 CLINICAL DATA:  Head trauma, minor (Age >= 65y) EXAM: CT HEAD WITHOUT CONTRAST TECHNIQUE: Contiguous axial images were obtained from the base of the skull through the vertex without intravenous contrast. COMPARISON:  None. FINDINGS: Brain: There is no acute intracranial hemorrhage, mass effect, or edema.  Gray-white differentiation is preserved. There is no  extra-axial fluid collection. Prominence of the ventricles and sulci reflects mild parenchymal volume loss. Patchy and confluent hypoattenuation in the supratentorial white matter is nonspecific but probably reflects moderate chronic microvascular ischemic changes. Vascular: There is atherosclerotic calcification at the skull base. Skull: Calvarium is unremarkable. Sinuses/Orbits: No acute finding. Other: None. IMPRESSION: No evidence of acute intracranial injury. Electronically Signed   By: Macy Mis M.D.   On: 09/01/2021 18:03   MR BRAIN WO CONTRAST  Result Date: 09/02/2021 CLINICAL DATA:  Mental status change, unknown cause. Additional history provided: Nausea and vomiting for 1 day after receiving the flu vaccine, subsequent weakness, confusion. EXAM: MRI HEAD WITHOUT CONTRAST TECHNIQUE: Multiplanar, multiecho pulse sequences of the brain and surrounding structures were obtained without intravenous contrast. COMPARISON:  Head CT 09/01/2021. FINDINGS: Brain: Intermittently motion degraded examination, limiting evaluation. Most notably, there is severe motion degradation of the axial T2 TSE sequence, moderate motion degradation of the axial SWI sequence, moderate motion degradation of the axial T1 weighted sequence and moderate motion degradation of the coronal T2 TSE sequence. Mild parenchymal atrophy. Moderate multifocal T2 FLAIR hyperintense signal abnormality within the cerebral white matter, nonspecific but compatible with chronic small vessel ischemic disease. Chronic small vessel ischemic changes are also present within the bilateral basal ganglia and within the pons. There is no acute infarct. No evidence of an intracranial mass. No chronic intracranial blood products. No extra-axial fluid collection. No midline shift. Vascular: Maintained flow voids within the proximal large arterial vessels. Skull and upper cervical spine: No focal suspicious  marrow lesion. Sinuses/Orbits: Visualized orbits show no acute finding. Other: Trace fluid within the left mastoid air cells. IMPRESSION: Motion degraded exam, as described. No evidence of acute intracranial abnormality. Moderate chronic small vessel ischemic changes within the cerebral white matter. Chronic small vessel ischemic changes are also present within the bilateral basal ganglia and within the pons. Mild generalized parenchymal atrophy. Electronically Signed   By: Kellie Simmering D.O.   On: 09/02/2021 12:45   CT RENAL STONE STUDY  Result Date: 09/02/2021 CLINICAL DATA:  Back pain, flank pain EXAM: CT ABDOMEN AND PELVIS WITHOUT CONTRAST TECHNIQUE: Multidetector CT imaging of the abdomen and pelvis was performed following the standard protocol without IV contrast. COMPARISON:  08/10/2019 FINDINGS: Lower chest: Hiatal hernia. No pleural or pericardial effusion. Coarse subpleural linear opacities posteriorly in both lung bases. Hepatobiliary: Stable small hepatic cysts. No calcified stones. The gallbladder is nondistended. No biliary ductal dilatation. Pancreas: Unremarkable. No pancreatic ductal dilatation or surrounding inflammatory changes. Spleen: Normal in size without focal abnormality. Adrenals/Urinary Tract: Upper pole right and lower pole left renal lesions possibly cysts, incompletely characterized. No urolithiasis or hydronephrosis. Urinary bladder is physiologically distended. Stomach/Bowel: Moderate hiatal hernia involving much of the fundus. The stomach is nondilated. The small bowel is non distended. Normal appendix. The colon is nondilated, unremarkable. Vascular/Lymphatic: Scattered aortoiliac calcified plaque without aneurysm. No abdominal or pelvic adenopathy. Reproductive: Mild prostate enlargement with central coarse calcifications. Other: Left pelvic phleboliths.  No ascites.  No free air. Musculoskeletal: Multilevel lower thoracic and lumbar spondylitic change. No fracture or worrisome  bone lesion. IMPRESSION: 1. No acute findings.  No urolithiasis. 2. Hiatal hernia 3.  Aortic Atherosclerosis (ICD10-170.0). Electronically Signed   By: Lucrezia Europe M.D.   On: 09/02/2021 12:26    Assessment/Plan  1. Generalized weakness -   for home health PT and OT, for therapeutic strengthening exercises  2. Upper GI bleeding -  follow up with GI in 2 months and  EGD in 3 months to document healing esophagitis - pantoprazole (PROTONIX) 40 MG tablet; Take 1 tablet (40 mg total) by mouth 2 (two) times daily before a meal for 60 days, THEN 1 tablet (40 mg total) daily.  Dispense: 480 tablet; Refill: 0  3. Chronic low back pain with right-sided sciatica, unspecified back pain laterality - gabapentin (NEURONTIN) 100 MG capsule; Take 2 capsules (200 mg total) by mouth 3 (three) times daily.  Dispense: 180 capsule; Refill: 0  4. Major depression, recurrent, chronic (HCC) -  PHQ-9 score is 0, no depressed mood - sertraline (ZOLOFT) 100 MG tablet; Take 1 tablet (100 mg total) by mouth at bedtime.  Dispense: 30 tablet; Refill: 0  5. Acute encephalopathy -  resolved, BIMS score 13/15, ranging within normal cognition - thiamine 100 MG tablet; Take 1 tablet (100 mg total) by mouth daily.  Dispense: 30 tablet; Refill: 0 - folic acid (FOLVITE) 1 MG tablet; Take 1 tablet (1 mg total) by mouth daily.  Dispense: 30 tablet; Refill: 0      I have filled out patient's discharge paperwork and e-prescribed medications.  Patient will receive home health PT and OT.  DME provided:  Rolling walker  Total discharge time: Greater than 30 minutes Greater than 50% was spent in counseling and coordination of care.   Discharge time involved coordination of the discharge process with social worker, nursing staff and therapy department. Medical justification for home health services/DME verified.    Durenda Age, DNP, MSN, FNP-BC [facility] [phone] (Monday-Friday 8:00  a.m. - 5:00 p.m.) [phone] (after hours)

## 2021-09-28 DIAGNOSIS — Z9181 History of falling: Secondary | ICD-10-CM | POA: Diagnosis not present

## 2021-09-28 DIAGNOSIS — M47816 Spondylosis without myelopathy or radiculopathy, lumbar region: Secondary | ICD-10-CM | POA: Diagnosis not present

## 2021-09-28 DIAGNOSIS — M150 Primary generalized (osteo)arthritis: Secondary | ICD-10-CM | POA: Diagnosis not present

## 2021-09-28 DIAGNOSIS — K449 Diaphragmatic hernia without obstruction or gangrene: Secondary | ICD-10-CM | POA: Diagnosis not present

## 2021-09-28 DIAGNOSIS — K922 Gastrointestinal hemorrhage, unspecified: Secondary | ICD-10-CM | POA: Diagnosis not present

## 2021-09-28 DIAGNOSIS — M47814 Spondylosis without myelopathy or radiculopathy, thoracic region: Secondary | ICD-10-CM | POA: Diagnosis not present

## 2021-09-28 DIAGNOSIS — F32A Depression, unspecified: Secondary | ICD-10-CM | POA: Diagnosis not present

## 2021-09-28 DIAGNOSIS — N400 Enlarged prostate without lower urinary tract symptoms: Secondary | ICD-10-CM | POA: Diagnosis not present

## 2021-09-28 DIAGNOSIS — G8929 Other chronic pain: Secondary | ICD-10-CM | POA: Diagnosis not present

## 2021-09-28 DIAGNOSIS — G319 Degenerative disease of nervous system, unspecified: Secondary | ICD-10-CM | POA: Diagnosis not present

## 2021-09-28 DIAGNOSIS — M5441 Lumbago with sciatica, right side: Secondary | ICD-10-CM | POA: Diagnosis not present

## 2021-09-28 DIAGNOSIS — I700 Atherosclerosis of aorta: Secondary | ICD-10-CM | POA: Diagnosis not present

## 2021-10-01 DIAGNOSIS — M150 Primary generalized (osteo)arthritis: Secondary | ICD-10-CM | POA: Diagnosis not present

## 2021-10-01 DIAGNOSIS — M5441 Lumbago with sciatica, right side: Secondary | ICD-10-CM | POA: Diagnosis not present

## 2021-10-01 DIAGNOSIS — K922 Gastrointestinal hemorrhage, unspecified: Secondary | ICD-10-CM | POA: Diagnosis not present

## 2021-10-01 DIAGNOSIS — G8929 Other chronic pain: Secondary | ICD-10-CM | POA: Diagnosis not present

## 2021-10-01 DIAGNOSIS — M47814 Spondylosis without myelopathy or radiculopathy, thoracic region: Secondary | ICD-10-CM | POA: Diagnosis not present

## 2021-10-01 DIAGNOSIS — M47816 Spondylosis without myelopathy or radiculopathy, lumbar region: Secondary | ICD-10-CM | POA: Diagnosis not present

## 2021-10-02 ENCOUNTER — Ambulatory Visit: Payer: Medicare Other | Admitting: Family

## 2021-10-02 DIAGNOSIS — M47814 Spondylosis without myelopathy or radiculopathy, thoracic region: Secondary | ICD-10-CM | POA: Diagnosis not present

## 2021-10-02 DIAGNOSIS — G8929 Other chronic pain: Secondary | ICD-10-CM | POA: Diagnosis not present

## 2021-10-02 DIAGNOSIS — K922 Gastrointestinal hemorrhage, unspecified: Secondary | ICD-10-CM | POA: Diagnosis not present

## 2021-10-02 DIAGNOSIS — M150 Primary generalized (osteo)arthritis: Secondary | ICD-10-CM | POA: Diagnosis not present

## 2021-10-02 DIAGNOSIS — M5441 Lumbago with sciatica, right side: Secondary | ICD-10-CM | POA: Diagnosis not present

## 2021-10-02 DIAGNOSIS — M47816 Spondylosis without myelopathy or radiculopathy, lumbar region: Secondary | ICD-10-CM | POA: Diagnosis not present

## 2021-10-03 DIAGNOSIS — F410 Panic disorder [episodic paroxysmal anxiety] without agoraphobia: Secondary | ICD-10-CM | POA: Diagnosis not present

## 2021-10-03 DIAGNOSIS — F331 Major depressive disorder, recurrent, moderate: Secondary | ICD-10-CM | POA: Diagnosis not present

## 2021-10-04 DIAGNOSIS — G8929 Other chronic pain: Secondary | ICD-10-CM | POA: Diagnosis not present

## 2021-10-04 DIAGNOSIS — M5441 Lumbago with sciatica, right side: Secondary | ICD-10-CM | POA: Diagnosis not present

## 2021-10-04 DIAGNOSIS — M150 Primary generalized (osteo)arthritis: Secondary | ICD-10-CM | POA: Diagnosis not present

## 2021-10-04 DIAGNOSIS — M47814 Spondylosis without myelopathy or radiculopathy, thoracic region: Secondary | ICD-10-CM | POA: Diagnosis not present

## 2021-10-04 DIAGNOSIS — M47816 Spondylosis without myelopathy or radiculopathy, lumbar region: Secondary | ICD-10-CM | POA: Diagnosis not present

## 2021-10-04 DIAGNOSIS — K922 Gastrointestinal hemorrhage, unspecified: Secondary | ICD-10-CM | POA: Diagnosis not present

## 2021-10-07 DIAGNOSIS — M47816 Spondylosis without myelopathy or radiculopathy, lumbar region: Secondary | ICD-10-CM | POA: Diagnosis not present

## 2021-10-07 DIAGNOSIS — M150 Primary generalized (osteo)arthritis: Secondary | ICD-10-CM | POA: Diagnosis not present

## 2021-10-07 DIAGNOSIS — K922 Gastrointestinal hemorrhage, unspecified: Secondary | ICD-10-CM | POA: Diagnosis not present

## 2021-10-07 DIAGNOSIS — M47814 Spondylosis without myelopathy or radiculopathy, thoracic region: Secondary | ICD-10-CM | POA: Diagnosis not present

## 2021-10-07 DIAGNOSIS — G8929 Other chronic pain: Secondary | ICD-10-CM | POA: Diagnosis not present

## 2021-10-07 DIAGNOSIS — M5441 Lumbago with sciatica, right side: Secondary | ICD-10-CM | POA: Diagnosis not present

## 2021-10-09 DIAGNOSIS — M47816 Spondylosis without myelopathy or radiculopathy, lumbar region: Secondary | ICD-10-CM | POA: Diagnosis not present

## 2021-10-09 DIAGNOSIS — M150 Primary generalized (osteo)arthritis: Secondary | ICD-10-CM | POA: Diagnosis not present

## 2021-10-09 DIAGNOSIS — K922 Gastrointestinal hemorrhage, unspecified: Secondary | ICD-10-CM | POA: Diagnosis not present

## 2021-10-09 DIAGNOSIS — M47814 Spondylosis without myelopathy or radiculopathy, thoracic region: Secondary | ICD-10-CM | POA: Diagnosis not present

## 2021-10-09 DIAGNOSIS — G8929 Other chronic pain: Secondary | ICD-10-CM | POA: Diagnosis not present

## 2021-10-09 DIAGNOSIS — M5441 Lumbago with sciatica, right side: Secondary | ICD-10-CM | POA: Diagnosis not present

## 2021-10-14 DIAGNOSIS — G8929 Other chronic pain: Secondary | ICD-10-CM | POA: Diagnosis not present

## 2021-10-14 DIAGNOSIS — M5441 Lumbago with sciatica, right side: Secondary | ICD-10-CM | POA: Diagnosis not present

## 2021-10-14 DIAGNOSIS — M47814 Spondylosis without myelopathy or radiculopathy, thoracic region: Secondary | ICD-10-CM | POA: Diagnosis not present

## 2021-10-14 DIAGNOSIS — M150 Primary generalized (osteo)arthritis: Secondary | ICD-10-CM | POA: Diagnosis not present

## 2021-10-14 DIAGNOSIS — M47816 Spondylosis without myelopathy or radiculopathy, lumbar region: Secondary | ICD-10-CM | POA: Diagnosis not present

## 2021-10-14 DIAGNOSIS — K922 Gastrointestinal hemorrhage, unspecified: Secondary | ICD-10-CM | POA: Diagnosis not present

## 2021-10-16 DIAGNOSIS — M47814 Spondylosis without myelopathy or radiculopathy, thoracic region: Secondary | ICD-10-CM | POA: Diagnosis not present

## 2021-10-16 DIAGNOSIS — G8929 Other chronic pain: Secondary | ICD-10-CM | POA: Diagnosis not present

## 2021-10-16 DIAGNOSIS — M150 Primary generalized (osteo)arthritis: Secondary | ICD-10-CM | POA: Diagnosis not present

## 2021-10-16 DIAGNOSIS — K922 Gastrointestinal hemorrhage, unspecified: Secondary | ICD-10-CM | POA: Diagnosis not present

## 2021-10-16 DIAGNOSIS — M5441 Lumbago with sciatica, right side: Secondary | ICD-10-CM | POA: Diagnosis not present

## 2021-10-16 DIAGNOSIS — M47816 Spondylosis without myelopathy or radiculopathy, lumbar region: Secondary | ICD-10-CM | POA: Diagnosis not present

## 2021-10-17 ENCOUNTER — Encounter: Payer: Self-pay | Admitting: Family Medicine

## 2021-10-17 ENCOUNTER — Other Ambulatory Visit: Payer: Self-pay

## 2021-10-17 ENCOUNTER — Ambulatory Visit (INDEPENDENT_AMBULATORY_CARE_PROVIDER_SITE_OTHER): Payer: Medicare Other | Admitting: Family Medicine

## 2021-10-17 VITALS — BP 138/76 | HR 72 | Temp 96.9°F | Ht 69.0 in | Wt 189.6 lb

## 2021-10-17 DIAGNOSIS — G8929 Other chronic pain: Secondary | ICD-10-CM | POA: Diagnosis not present

## 2021-10-17 DIAGNOSIS — G9340 Encephalopathy, unspecified: Secondary | ICD-10-CM

## 2021-10-17 DIAGNOSIS — I700 Atherosclerosis of aorta: Secondary | ICD-10-CM | POA: Diagnosis not present

## 2021-10-17 DIAGNOSIS — F331 Major depressive disorder, recurrent, moderate: Secondary | ICD-10-CM | POA: Diagnosis not present

## 2021-10-17 DIAGNOSIS — M5441 Lumbago with sciatica, right side: Secondary | ICD-10-CM | POA: Diagnosis not present

## 2021-10-17 DIAGNOSIS — K922 Gastrointestinal hemorrhage, unspecified: Secondary | ICD-10-CM | POA: Diagnosis not present

## 2021-10-17 MED ORDER — GABAPENTIN 300 MG PO CAPS: 300.0000 mg | ORAL_CAPSULE | Freq: Three times a day (TID) | ORAL | 3 refills | Status: DC

## 2021-10-17 NOTE — Progress Notes (Addendum)
Provider:  Alain Honey, MD  Careteam: Patient Care Team: Wardell Honour, MD as PCP - General (Family Medicine) Otis Brace, MD as Consulting Physician (Gastroenterology)  PLACE OF SERVICE:  [facility] Directive information Does Patient Have a Medical Advance Directive?: Yes, Type of Advance Directive: Living will, Does patient want to make changes to medical advance directive?: No - Patient declined  Allergies  Allergen Reactions   Codeine Nausea Only    Chief Complaint  Patient presents with   New Patient (Initial Visit)    Patient presents today for new patient appointment.     HPI: Patient is a 75 y.o. male .  New patient visit for this gentleman who was hospitalized in May and October and then rehabbed at [facility].  Problems included encephalopathy dehydration GI bleed. He has a long history of back disease and prior surgery with some residual weakness in his right leg.  He is getting home PT doing walking.  He seems to have good support system from friends around his home. He does all homemaking and cooking. He also sees geriatric psychiatrist who is treating him with antidepressants.  Currently on sertraline as well as Wellbutrin but he is being tapered off the sertraline.  Review of Systems:  Review of Systems  Constitutional: Negative.   Respiratory: Negative.    Cardiovascular: Negative.   Neurological: Negative.   Psychiatric/Behavioral:  Positive for depression.   All other systems reviewed and are negative.  Past Medical History:  Diagnosis Date   Arthritis    Depression    Past Surgical History:  Procedure Laterality Date   BACK SURGERY     BIOPSY  08/12/2019   Procedure: BIOPSY;  Surgeon: Otis Brace, MD;  Location: [facility];  Service: Gastroenterology;;   BIOPSY  09/04/2021   Procedure: BIOPSY;  Surgeon: Otis Brace, MD;  Location: [facility];  Service: Gastroenterology;;   CATARACT EXTRACTION     Per  [facility] New Patient Packet   COLONOSCOPY     Per [facility] Patient Packet   ESOPHAGOGASTRODUODENOSCOPY N/A 09/04/2021   Procedure: ESOPHAGOGASTRODUODENOSCOPY (EGD);  Surgeon: Otis Brace, MD;  Location: [facility];  Service: Gastroenterology;  Laterality: N/A;   ESOPHAGOGASTRODUODENOSCOPY (EGD) WITH PROPOFOL N/A 08/12/2019   Procedure: ESOPHAGOGASTRODUODENOSCOPY (EGD) WITH PROPOFOL;  Surgeon: Otis Brace, MD;  Location: [facility];  Service: Gastroenterology;  Laterality: N/A;   EYE SURGERY Bilateral    cataracts   KNEE ARTHROSCOPY Left    LUMBAR LAMINECTOMY/DECOMPRESSION MICRODISCECTOMY N/A 06/27/2019   Procedure: L3-4 HEMILAMINECTOMY DISCECTOMY;  Surgeon: Deetta Perla, MD;  Location: [facility];  Service: Neurosurgery;  Laterality: N/A;   UPPER GI ENDOSCOPY  2021   Per [facility] New Patient Packet, Dr.Brahmbatt   Social History:   reports that he quit smoking about 17 years ago. His smoking use included cigarettes. He has a 15.00 pack-year smoking history. He has never used smokeless tobacco. He reports that he does not currently use alcohol. He reports that he does not use drugs.  Family History  Problem Relation Age of Onset   Stroke Mother    Pneumonia Mother    Arthritis Mother    Cancer Father    Stroke Maternal Grandmother    Cancer Maternal Grandfather     Medications: Patient's Medications  New Prescriptions   No medications on file  Previous Medications   ACETAMINOPHEN (TYLENOL) 500 MG TABLET    Take 500 mg by mouth as needed (pain).   BUPROPION (WELLBUTRIN XL) 150 MG 24  HR TABLET    Take 150 mg by mouth daily.   FOLIC ACID (FOLVITE) 1 MG TABLET    Take 1 tablet (1 mg total) by mouth daily.   GABAPENTIN (NEURONTIN) 100 MG CAPSULE    Take 2 capsules (200 mg total) by mouth 3 (three) times daily.   MULTIPLE VITAMIN (MULTIVITAMIN WITH MINERALS) TABS TABLET    Take 1 tablet by mouth daily.   ONDANSETRON (ZOFRAN) 4 MG TABLET    Take 1 tablet (4 mg total) by mouth every 6  (six) hours as needed for nausea.   PANTOPRAZOLE (PROTONIX) 40 MG TABLET    Take 1 tablet (40 mg total) by mouth 2 (two) times daily before a meal for 60 days, THEN 1 tablet (40 mg total) daily.   SERTRALINE (ZOLOFT) 100 MG TABLET    Take 1 tablet (100 mg total) by mouth at bedtime.   THIAMINE 100 MG TABLET    Take 1 tablet (100 mg total) by mouth daily.  Modified Medications   No medications on file  Discontinued Medications   No medications on file    Physical Exam:  Vitals:   10/17/21 1041  BP: 138/76  Pulse: 72  Temp: (!) 96.9 F (36.1 C)  SpO2: 98%  Weight: 189 lb 9.6 oz (86 kg)  Height: 5\' 9"  (1.753 m)   Body mass index is 28 kg/m. Wt Readings from Last 3 Encounters:  10/17/21 189 lb 9.6 oz (86 kg)  09/26/21 183 lb 12.8 oz (83.4 kg)  09/18/21 185 lb 9.6 oz (84.2 kg)    Physical Exam Vitals and nursing note reviewed.  Constitutional:      Appearance: Normal appearance.  HENT:     Head: Normocephalic.  Cardiovascular:     Rate and Rhythm: Normal rate and regular rhythm.  Pulmonary:     Effort: Pulmonary effort is normal.     Breath sounds: Normal breath sounds.  Abdominal:     General: Abdomen is flat.  Musculoskeletal:        General: Normal range of motion.  Neurological:     General: No focal deficit present.     Mental Status: He is alert and oriented to person, place, and time.    Labs reviewed: Basic Metabolic Panel: Recent Labs    09/02/21 0008 09/02/21 0349 09/03/21 0331 09/05/21 0336 09/06/21 0330 09/08/21 0318 09/18/21 0000  NA  --  137   < > 135 135 137 147  K  --  3.6   < > 3.8 3.6 3.8 4.0  CL  --  105   < > 101 100 100 111*  CO2  --  18*   < > 26 26 29  23*  GLUCOSE  --  84   < > 106* 108* 81  --   BUN  --  33*   < > 15 26* 33* 16  CREATININE  --  1.11   < > 0.87 0.78 1.15 0.9  CALCIUM  --  9.4   < > 9.4 9.3 9.4 8.9  MG 2.6*  --    < > 2.0 2.3 2.1  --   PHOS 2.0*  --   --   --   --   --   --   TSH 0.494 0.432  --   --   --   --    --    < > = values in this interval not displayed.   Liver Function Tests: Recent Labs  09/01/21 1710 09/02/21 0349 09/03/21 0331  AST 29 25 19   ALT 23 20 15   ALKPHOS 64 61 53  BILITOT 1.9* 1.7* 1.4*  PROT 7.5 7.1 5.5*  ALBUMIN 4.3 4.1 3.2*   No results for input(s): LIPASE, AMYLASE in the last 8760 hours. Recent Labs    09/02/21 0008  AMMONIA 28   CBC: Recent Labs    09/01/21 1710 09/02/21 0349 09/03/21 0331 09/05/21 0336 09/06/21 0330 09/08/21 0318 09/18/21 0000  WBC 13.2* 10.0   < > 8.4 8.3 10.9* 5.5  NEUTROABS 11.0* 7.6  --   --   --   --   --   HGB 16.4 15.9   < > 14.3 15.0 15.0 12.6*  HCT 50.6 49.2   < > 43.5 45.6 46.1 38*  MCV 90.8 91.8   < > 89.5 90.5 91.3  --   PLT 218 185   < > 140* 152 198 157   < > = values in this interval not displayed.   Lipid Panel: Recent Labs    09/16/21 0000  CHOL 182  HDL 48  LDLCALC 118  TRIG 81   TSH: Recent Labs    09/02/21 0008 09/02/21 0349  TSH 0.494 0.432   A1C: Lab Results  Component Value Date   HGBA1C 5.0 09/16/2021     Assessment/Plan  1. Acute encephalopathy This is totally resolved.  Patient's mental status is appropriate.  This was probably multifactorial when he was admitted to the hospital  2. Acute upper GI bleed No further bleeding.  He continues with pantoprazole and has follow-up appointment with gastroenterologist in a few weeks  3. Aortic atherosclerosis (HCC) Likely an incidental finding.  He does not have any symptoms suggestive of vascular disease peripherally and pulses are present in his lower extremities  4. Chronic low back pain with right-sided sciatica, unspecified back pain laterality He is off muscle relaxants but continues with gabapentin 600 mg a day as well as exercises and walking     Alain Honey, MD [facility] [phone]

## 2021-10-17 NOTE — Addendum Note (Signed)
Addended by: Wardell Honour on: 10/17/2021 11:59 AM   Modules accepted: Orders

## 2021-10-21 DIAGNOSIS — M150 Primary generalized (osteo)arthritis: Secondary | ICD-10-CM | POA: Diagnosis not present

## 2021-10-21 DIAGNOSIS — M5441 Lumbago with sciatica, right side: Secondary | ICD-10-CM | POA: Diagnosis not present

## 2021-10-21 DIAGNOSIS — G8929 Other chronic pain: Secondary | ICD-10-CM | POA: Diagnosis not present

## 2021-10-21 DIAGNOSIS — K922 Gastrointestinal hemorrhage, unspecified: Secondary | ICD-10-CM | POA: Diagnosis not present

## 2021-10-21 DIAGNOSIS — M47814 Spondylosis without myelopathy or radiculopathy, thoracic region: Secondary | ICD-10-CM | POA: Diagnosis not present

## 2021-10-21 DIAGNOSIS — M47816 Spondylosis without myelopathy or radiculopathy, lumbar region: Secondary | ICD-10-CM | POA: Diagnosis not present

## 2021-10-23 ENCOUNTER — Other Ambulatory Visit: Payer: Self-pay | Admitting: Adult Health

## 2021-10-23 DIAGNOSIS — G8929 Other chronic pain: Secondary | ICD-10-CM | POA: Diagnosis not present

## 2021-10-23 DIAGNOSIS — K922 Gastrointestinal hemorrhage, unspecified: Secondary | ICD-10-CM | POA: Diagnosis not present

## 2021-10-23 DIAGNOSIS — M5441 Lumbago with sciatica, right side: Secondary | ICD-10-CM | POA: Diagnosis not present

## 2021-10-23 DIAGNOSIS — M150 Primary generalized (osteo)arthritis: Secondary | ICD-10-CM | POA: Diagnosis not present

## 2021-10-23 DIAGNOSIS — M47814 Spondylosis without myelopathy or radiculopathy, thoracic region: Secondary | ICD-10-CM | POA: Diagnosis not present

## 2021-10-23 DIAGNOSIS — M47816 Spondylosis without myelopathy or radiculopathy, lumbar region: Secondary | ICD-10-CM | POA: Diagnosis not present

## 2021-10-24 DIAGNOSIS — F331 Major depressive disorder, recurrent, moderate: Secondary | ICD-10-CM | POA: Diagnosis not present

## 2021-10-24 DIAGNOSIS — F410 Panic disorder [episodic paroxysmal anxiety] without agoraphobia: Secondary | ICD-10-CM | POA: Diagnosis not present

## 2021-10-28 DIAGNOSIS — M47814 Spondylosis without myelopathy or radiculopathy, thoracic region: Secondary | ICD-10-CM | POA: Diagnosis not present

## 2021-10-28 DIAGNOSIS — M150 Primary generalized (osteo)arthritis: Secondary | ICD-10-CM | POA: Diagnosis not present

## 2021-10-28 DIAGNOSIS — F32A Depression, unspecified: Secondary | ICD-10-CM | POA: Diagnosis not present

## 2021-10-28 DIAGNOSIS — I700 Atherosclerosis of aorta: Secondary | ICD-10-CM | POA: Diagnosis not present

## 2021-10-28 DIAGNOSIS — G319 Degenerative disease of nervous system, unspecified: Secondary | ICD-10-CM | POA: Diagnosis not present

## 2021-10-28 DIAGNOSIS — K449 Diaphragmatic hernia without obstruction or gangrene: Secondary | ICD-10-CM | POA: Diagnosis not present

## 2021-10-28 DIAGNOSIS — G8929 Other chronic pain: Secondary | ICD-10-CM | POA: Diagnosis not present

## 2021-10-28 DIAGNOSIS — Z9181 History of falling: Secondary | ICD-10-CM | POA: Diagnosis not present

## 2021-10-28 DIAGNOSIS — M47816 Spondylosis without myelopathy or radiculopathy, lumbar region: Secondary | ICD-10-CM | POA: Diagnosis not present

## 2021-10-28 DIAGNOSIS — N400 Enlarged prostate without lower urinary tract symptoms: Secondary | ICD-10-CM | POA: Diagnosis not present

## 2021-10-28 DIAGNOSIS — K922 Gastrointestinal hemorrhage, unspecified: Secondary | ICD-10-CM | POA: Diagnosis not present

## 2021-10-28 DIAGNOSIS — M5441 Lumbago with sciatica, right side: Secondary | ICD-10-CM | POA: Diagnosis not present

## 2021-10-29 DIAGNOSIS — K209 Esophagitis, unspecified without bleeding: Secondary | ICD-10-CM | POA: Diagnosis not present

## 2021-10-29 DIAGNOSIS — Z8719 Personal history of other diseases of the digestive system: Secondary | ICD-10-CM | POA: Diagnosis not present

## 2021-10-31 DIAGNOSIS — F410 Panic disorder [episodic paroxysmal anxiety] without agoraphobia: Secondary | ICD-10-CM | POA: Diagnosis not present

## 2021-10-31 DIAGNOSIS — F331 Major depressive disorder, recurrent, moderate: Secondary | ICD-10-CM | POA: Diagnosis not present

## 2021-11-04 DIAGNOSIS — M47814 Spondylosis without myelopathy or radiculopathy, thoracic region: Secondary | ICD-10-CM | POA: Diagnosis not present

## 2021-11-04 DIAGNOSIS — M150 Primary generalized (osteo)arthritis: Secondary | ICD-10-CM | POA: Diagnosis not present

## 2021-11-04 DIAGNOSIS — G8929 Other chronic pain: Secondary | ICD-10-CM | POA: Diagnosis not present

## 2021-11-04 DIAGNOSIS — K922 Gastrointestinal hemorrhage, unspecified: Secondary | ICD-10-CM | POA: Diagnosis not present

## 2021-11-04 DIAGNOSIS — M47816 Spondylosis without myelopathy or radiculopathy, lumbar region: Secondary | ICD-10-CM | POA: Diagnosis not present

## 2021-11-04 DIAGNOSIS — M5441 Lumbago with sciatica, right side: Secondary | ICD-10-CM | POA: Diagnosis not present

## 2021-11-12 DIAGNOSIS — F331 Major depressive disorder, recurrent, moderate: Secondary | ICD-10-CM | POA: Diagnosis not present

## 2021-12-05 DIAGNOSIS — F331 Major depressive disorder, recurrent, moderate: Secondary | ICD-10-CM | POA: Diagnosis not present

## 2021-12-12 DIAGNOSIS — F331 Major depressive disorder, recurrent, moderate: Secondary | ICD-10-CM | POA: Diagnosis not present

## 2021-12-12 DIAGNOSIS — F410 Panic disorder [episodic paroxysmal anxiety] without agoraphobia: Secondary | ICD-10-CM | POA: Diagnosis not present

## 2021-12-26 DIAGNOSIS — F331 Major depressive disorder, recurrent, moderate: Secondary | ICD-10-CM | POA: Diagnosis not present

## 2022-01-02 ENCOUNTER — Emergency Department (HOSPITAL_COMMUNITY)
Admission: EM | Admit: 2022-01-02 | Discharge: 2022-01-03 | Disposition: A | Discharge status: Home / Self Care | Payer: Medicare Other | Attending: Emergency Medicine | Admitting: Emergency Medicine

## 2022-01-02 ENCOUNTER — Encounter (HOSPITAL_COMMUNITY): Payer: Self-pay

## 2022-01-02 ENCOUNTER — Emergency Department (HOSPITAL_COMMUNITY): Payer: Medicare Other

## 2022-01-02 ENCOUNTER — Other Ambulatory Visit: Payer: Self-pay

## 2022-01-02 DIAGNOSIS — S0990XA Unspecified injury of head, initial encounter: Secondary | ICD-10-CM | POA: Diagnosis not present

## 2022-01-02 DIAGNOSIS — Z23 Encounter for immunization: Secondary | ICD-10-CM | POA: Diagnosis not present

## 2022-01-02 DIAGNOSIS — S199XXA Unspecified injury of neck, initial encounter: Secondary | ICD-10-CM | POA: Diagnosis not present

## 2022-01-02 DIAGNOSIS — R6889 Other general symptoms and signs: Secondary | ICD-10-CM | POA: Diagnosis not present

## 2022-01-02 DIAGNOSIS — R0789 Other chest pain: Secondary | ICD-10-CM | POA: Diagnosis not present

## 2022-01-02 DIAGNOSIS — W19XXXA Unspecified fall, initial encounter: Secondary | ICD-10-CM | POA: Insufficient documentation

## 2022-01-02 DIAGNOSIS — S0993XA Unspecified injury of face, initial encounter: Secondary | ICD-10-CM | POA: Diagnosis not present

## 2022-01-02 DIAGNOSIS — S0181XA Laceration without foreign body of other part of head, initial encounter: Secondary | ICD-10-CM | POA: Insufficient documentation

## 2022-01-02 MED ORDER — LIDOCAINE-EPINEPHRINE 2 %-1:100000 IJ SOLN
20.0000 mL | Freq: Once | INTRAMUSCULAR | Status: AC
  Administered 2022-01-02: 20 mL via INTRADERMAL
  Filled 2022-01-02: qty 1

## 2022-01-02 MED ORDER — TETANUS-DIPHTH-ACELL PERTUSSIS 5-2.5-18.5 LF-MCG/0.5 IM SUSY
0.5000 mL | PREFILLED_SYRINGE | Freq: Once | INTRAMUSCULAR | Status: AC
  Administered 2022-01-02: 0.5 mL via INTRAMUSCULAR
  Filled 2022-01-02: qty 0.5

## 2022-01-02 NOTE — Discharge Instructions (Addendum)
The sutures will dissolve. You will not need to get them removed   Please follow up with your primary care provider within 5-7 days for re-evaluation of your symptoms. If you do not have a primary care provider, information for a healthcare clinic has been provided for you to make arrangements for follow up care. Please return to the emergency department for any new or worsening symptoms.

## 2022-01-02 NOTE — ED Triage Notes (Signed)
Pt was walking in his neighborhood with his can and fell against the curb. Pt has a laceration between his eyebrows, swelling around the forehead area, bridge of the nose has been scraped off with a skin flap still attached and bleeding from both nostrils. Pt is not on blood thinners, no LOC, no neck or back pain, alert and oriented.

## 2022-01-02 NOTE — ED Provider Triage Note (Signed)
Emergency Medicine Provider Triage Evaluation Note  Kazmir Oki , a 76 y.o. male  was evaluated in triage.  Pt complains of mechanical non syncopal fall.  He reports that he tripped on the curb.  His last Td was 02/16/22.  He denies any headache or neck pain.  He denies any blood thinners.    Physical Exam  BP 132/88 (BP Location: Left Arm)    Pulse 68    Temp 98.4 F (36.9 C) (Oral)    Resp 16    SpO2 93%  Gen:   Awake, no distress   Resp:  Normal effort  MSK:   Moves extremities without difficulty  Other:  Patient with avulsion/deep abrasion over the tip of the nose.  There is dried blood coming from the naris.  There is a large skin tear across the glabella.  Medical Decision Making  Medically screening exam initiated at 7:06 PM.  Appropriate orders placed.  Keison Glendinning was informed that the remainder of the evaluation will be completed by another provider, this initial triage assessment does not replace that evaluation, and the importance of remaining in the ED until their evaluation is complete.  Patient denies headache and neck pain, however based on his age and the mechanism and need for CT of max face due to concern for possible nasal bone fractures will obtain CTs of head, neck, and face.   Lorin Glass, [place] 01/02/22 1916

## 2022-01-02 NOTE — ED Provider Notes (Signed)
[facility] DEPT Provider Note   CSN: [id number] Arrival date & time: 01/02/22  1847     History  Chief Complaint  Patient presents with   Jose Bender is a 76 y.o. male.  HPI  76 year old male with a history of arthritis, depression, GI bleed, aortic atherosclerosis, who presents to the emergency department today for evaluation after a fall.  States he was walking downhill outside in his neighborhood when he lost his balance and fell forward hitting his head and face on the ground.  He also hit his head on the ground.  Denies loss of consciousness.  He has some pain to his forehead and nose.  He also has some pain to the anterior chest wall.  He denies any neck pain back pain hip pain or pain elsewhere.  He does not take any anticoagulation.  His tetanus is not up-to-date.  Home Medications Prior to Admission medications   Medication Sig Start Date End Date Taking? Authorizing Provider  acetaminophen (TYLENOL) 500 MG tablet Take 500 mg by mouth as needed (pain).    [provider]  buPROPion (WELLBUTRIN XL) 150 MG 24 hr tablet Take 150 mg by mouth daily.    [provider]  folic acid (FOLVITE) 1 MG tablet Take 1 tablet (1 mg total) by mouth daily. 09/26/21   Medina-Vargas, Monina C, NP  gabapentin (NEURONTIN) 300 MG capsule Take 1 capsule (300 mg total) by mouth 3 (three) times daily. 10/17/21   Wardell Honour, MD  Multiple Vitamin (MULTIVITAMIN WITH MINERALS) TABS tablet Take 1 tablet by mouth daily. 08/13/19   Aline August, MD  ondansetron (ZOFRAN) 4 MG tablet Take 1 tablet (4 mg total) by mouth every 6 (six) hours as needed for nausea. 08/13/19   Aline August, MD  pantoprazole (PROTONIX) 40 MG tablet Take 1 tablet (40 mg total) by mouth 2 (two) times daily before a meal for 60 days, THEN 1 tablet (40 mg total) daily. 09/26/21 11/25/22  Medina-Vargas, Monina C, NP  sertraline (ZOLOFT) 100 MG tablet Take 1 tablet (100 mg  total) by mouth at bedtime. 09/26/21   Medina-Vargas, Monina C, NP  thiamine 100 MG tablet Take 1 tablet (100 mg total) by mouth daily. 09/26/21   Medina-Vargas, Monina C, NP      Allergies    Codeine    Review of Systems   Review of Systems See HPI for pertinent positives or negatives.   Physical Exam Updated Vital Signs BP 140/76    Pulse 67    Temp 98.4 F (36.9 C) (Oral)    Resp 16    Ht 5\' 9"  (1.753 m)    Wt 86 kg    SpO2 98%    BMI 28.00 kg/m  Physical Exam Vitals and nursing note reviewed.  Constitutional:      General: He is not in acute distress.    Appearance: He is well-developed.  HENT:     Head: Normocephalic.     Comments: Vertical laceration to the forehead between the eyebrows, abrasion to the nose Eyes:     Conjunctiva/sclera: Conjunctivae normal.  Cardiovascular:     Rate and Rhythm: Normal rate and regular rhythm.     Heart sounds: No murmur heard. Pulmonary:     Effort: Pulmonary effort is normal. No respiratory distress.     Breath sounds: Normal breath sounds.     Comments: Mild anterior chest wall TTP Abdominal:     Palpations: Abdomen  is soft.     Tenderness: There is no abdominal tenderness.  Musculoskeletal:        General: No swelling.     Cervical back: Neck supple.     Comments: No TTP to the CTL spine.   Skin:    General: Skin is warm and dry.     Capillary Refill: Capillary refill takes less than 2 seconds.  Neurological:     Mental Status: He is alert.     Comments: Clear speech, no facial droop, moving all extremities  Psychiatric:        Mood and Affect: Mood normal.    ED Results / Procedures / Treatments   Labs (all labs ordered are listed, but only abnormal results are displayed) Labs Reviewed - No data to display  EKG None  Radiology DG Chest 2 View  Result Date: 01/02/2022 CLINICAL DATA:  Fall, chest wall tenderness palpation EXAM: CHEST - 2 VIEW COMPARISON:  09/01/2021 FINDINGS: Lungs are well expanded, symmetric,  and clear. No pneumothorax or pleural effusion. Cardiac size within normal limits. Pulmonary vascularity is normal. Osseous structures are age-appropriate. No acute bone abnormality. IMPRESSION: No active cardiopulmonary disease. Electronically Signed   By: Fidela Salisbury M.D.   On: 01/02/2022 23:10   CT Head Wo Contrast  Result Date: 01/02/2022 CLINICAL DATA:  Head trauma, minor (Age >= 65y); Neck trauma (Age >= 65y); Facial trauma, blunt EXAM: CT HEAD WITHOUT CONTRAST CT MAXILLOFACIAL WITHOUT CONTRAST CT CERVICAL SPINE WITHOUT CONTRAST TECHNIQUE: Multidetector CT imaging of the head, cervical spine, and maxillofacial structures were performed using the standard protocol without intravenous contrast. Multiplanar CT image reconstructions of the cervical spine and maxillofacial structures were also generated. RADIATION DOSE REDUCTION: This exam was performed according to the departmental dose-optimization program which includes automated exposure control, adjustment of the mA and/or kV according to patient size and/or use of iterative reconstruction technique. COMPARISON:  None. FINDINGS: CT HEAD FINDINGS BRAIN: BRAIN Patchy and confluent areas of decreased attenuation are noted throughout the deep and periventricular white matter of the cerebral hemispheres bilaterally, compatible with chronic microvascular ischemic disease. No evidence of large-territorial acute infarction. No parenchymal hemorrhage. No mass lesion. No extra-axial collection. No mass effect or midline shift. No hydrocephalus. Basilar cisterns are patent. Vascular: No hyperdense vessel. Skull: No acute fracture or focal lesion. Other: None. CT MAXILLOFACIAL FINDINGS Osseous: No fracture or mandibular dislocation. No destructive process. Chronic nasal septum deviation to the left. Likely old healed right nasal bone fracture. Sinuses/Orbits: Paranasal sinuses and mastoid air cells are clear. Bilateral lens replacement. Otherwise the orbits are  unremarkable. Soft tissues: Negative. CT CERVICAL SPINE FINDINGS Alignment: Normal. Skull base and vertebrae: Multilevel degenerative changes spine most prominent at the C6-T1level. No acute fracture. No aggressive appearing focal osseous lesion or focal pathologic process. Soft tissues and spinal canal: No prevertebral fluid or swelling. No visible canal hematoma. Upper chest: Unremarkable. Other: Atherosclerotic plaque of the carotid arteries. IMPRESSION: 1. No acute intracranial abnormality. 2. No acute displaced facial fracture. 3. No acute displaced fracture or traumatic listhesis of the cervical spine. Electronically Signed   By: Iven Finn M.D.   On: 01/02/2022 19:46   CT Cervical Spine Wo Contrast  Result Date: 01/02/2022 CLINICAL DATA:  Head trauma, minor (Age >= 65y); Neck trauma (Age >= 65y); Facial trauma, blunt EXAM: CT HEAD WITHOUT CONTRAST CT MAXILLOFACIAL WITHOUT CONTRAST CT CERVICAL SPINE WITHOUT CONTRAST TECHNIQUE: Multidetector CT imaging of the head, cervical spine, and maxillofacial structures were performed using the  standard protocol without intravenous contrast. Multiplanar CT image reconstructions of the cervical spine and maxillofacial structures were also generated. RADIATION DOSE REDUCTION: This exam was performed according to the departmental dose-optimization program which includes automated exposure control, adjustment of the mA and/or kV according to patient size and/or use of iterative reconstruction technique. COMPARISON:  None. FINDINGS: CT HEAD FINDINGS BRAIN: BRAIN Patchy and confluent areas of decreased attenuation are noted throughout the deep and periventricular white matter of the cerebral hemispheres bilaterally, compatible with chronic microvascular ischemic disease. No evidence of large-territorial acute infarction. No parenchymal hemorrhage. No mass lesion. No extra-axial collection. No mass effect or midline shift. No hydrocephalus. Basilar cisterns are patent.  Vascular: No hyperdense vessel. Skull: No acute fracture or focal lesion. Other: None. CT MAXILLOFACIAL FINDINGS Osseous: No fracture or mandibular dislocation. No destructive process. Chronic nasal septum deviation to the left. Likely old healed right nasal bone fracture. Sinuses/Orbits: Paranasal sinuses and mastoid air cells are clear. Bilateral lens replacement. Otherwise the orbits are unremarkable. Soft tissues: Negative. CT CERVICAL SPINE FINDINGS Alignment: Normal. Skull base and vertebrae: Multilevel degenerative changes spine most prominent at the C6-T1level. No acute fracture. No aggressive appearing focal osseous lesion or focal pathologic process. Soft tissues and spinal canal: No prevertebral fluid or swelling. No visible canal hematoma. Upper chest: Unremarkable. Other: Atherosclerotic plaque of the carotid arteries. IMPRESSION: 1. No acute intracranial abnormality. 2. No acute displaced facial fracture. 3. No acute displaced fracture or traumatic listhesis of the cervical spine. Electronically Signed   By: Iven Finn M.D.   On: 01/02/2022 19:46   CT Maxillofacial WO CM  Result Date: 01/02/2022 CLINICAL DATA:  Head trauma, minor (Age >= 65y); Neck trauma (Age >= 65y); Facial trauma, blunt EXAM: CT HEAD WITHOUT CONTRAST CT MAXILLOFACIAL WITHOUT CONTRAST CT CERVICAL SPINE WITHOUT CONTRAST TECHNIQUE: Multidetector CT imaging of the head, cervical spine, and maxillofacial structures were performed using the standard protocol without intravenous contrast. Multiplanar CT image reconstructions of the cervical spine and maxillofacial structures were also generated. RADIATION DOSE REDUCTION: This exam was performed according to the departmental dose-optimization program which includes automated exposure control, adjustment of the mA and/or kV according to patient size and/or use of iterative reconstruction technique. COMPARISON:  None. FINDINGS: CT HEAD FINDINGS BRAIN: BRAIN Patchy and confluent areas  of decreased attenuation are noted throughout the deep and periventricular white matter of the cerebral hemispheres bilaterally, compatible with chronic microvascular ischemic disease. No evidence of large-territorial acute infarction. No parenchymal hemorrhage. No mass lesion. No extra-axial collection. No mass effect or midline shift. No hydrocephalus. Basilar cisterns are patent. Vascular: No hyperdense vessel. Skull: No acute fracture or focal lesion. Other: None. CT MAXILLOFACIAL FINDINGS Osseous: No fracture or mandibular dislocation. No destructive process. Chronic nasal septum deviation to the left. Likely old healed right nasal bone fracture. Sinuses/Orbits: Paranasal sinuses and mastoid air cells are clear. Bilateral lens replacement. Otherwise the orbits are unremarkable. Soft tissues: Negative. CT CERVICAL SPINE FINDINGS Alignment: Normal. Skull base and vertebrae: Multilevel degenerative changes spine most prominent at the C6-T1level. No acute fracture. No aggressive appearing focal osseous lesion or focal pathologic process. Soft tissues and spinal canal: No prevertebral fluid or swelling. No visible canal hematoma. Upper chest: Unremarkable. Other: Atherosclerotic plaque of the carotid arteries. IMPRESSION: 1. No acute intracranial abnormality. 2. No acute displaced facial fracture. 3. No acute displaced fracture or traumatic listhesis of the cervical spine. Electronically Signed   By: Iven Finn M.D.   On: 01/02/2022 19:46  Procedures .Marland KitchenLaceration Repair  Date/Time: 01/03/2022 12:22 AM Performed by: Rodney Booze, PA-C Authorized by: Rodney Booze, PA-C   Consent:    Consent obtained:  Verbal   Consent given by:  Patient   Risks, benefits, and alternatives were discussed: yes     Risks discussed:  Infection, pain and need for additional repair   Alternatives discussed:  No treatment Universal protocol:    Procedure explained and questions answered to patient or proxy's  satisfaction: yes     Patient identity confirmed:  Verbally with patient Anesthesia:    Anesthesia method:  Local infiltration   Local anesthetic:  Lidocaine 2% WITH epi Laceration details:    Location:  Face   Face location:  Forehead   Length (cm):  2 Pre-procedure details:    Preparation:  Patient was prepped and draped in usual sterile fashion and imaging obtained to evaluate for foreign bodies Exploration:    Limited defect created (wound extended): no     Hemostasis achieved with:  Epinephrine and direct pressure   Imaging outcome: foreign body not noted     Wound exploration: wound explored through full range of motion and entire depth of wound visualized     Contaminated: no   Treatment:    Area cleansed with:  Povidone-iodine   Amount of cleaning:  Standard   Visualized foreign bodies/material removed: no     Debridement:  None   Undermining:  None   Scar revision: no   Skin repair:    Repair method:  Sutures   Suture size:  5-0 (vicryl)   Suture technique:  Simple interrupted   Number of sutures:  5 Approximation:    Approximation:  Close Repair type:    Repair type:  Simple Post-procedure details:    Dressing:  Open (no dressing)    Medications Ordered in ED Medications  Tdap (BOOSTRIX) injection 0.5 mL (0.5 mLs Intramuscular Given 01/02/22 2246)  lidocaine-EPINEPHrine (XYLOCAINE W/EPI) 2 %-1:100000 (with pres) injection 20 mL (20 mLs Intradermal Given 01/02/22 2246)    ED Course/ Medical Decision Making/ A&P                           Medical Decision Making Amount and/or Complexity of Data Reviewed Radiology: ordered.  Risk Prescription drug management.   This patient presents to the ED for concern of fall, this involves an extensive number of treatment options, and is a complaint that carries with it a high risk of complications and morbidity.  The differential diagnosis includes but is not limited to serious head injury such as ich/skull fx, spinal  cord injury, spinal fx, long bone fx, laceration   Comorbidities that complicate the patient evaluation: Patients presentation is complicated by their history of arthritis, spondylosis  Additional history obtained: Records reviewed Primary Care Documents   Imaging Studies ordered: I ordered, independently visualized, and interpreted imaging which showed  CT head/cervical spine/maxillofacial - 1. No acute intracranial abnormality. 2. No acute displaced facial fracture. 3. No acute displaced fracture or traumatic listhesis of the cervical spine.  CXR negative  I agree with the radiologist interpretation   Medicines ordered and prescription drug management: pt declined  Critical Interventions: tdap updated, lac repair  Complexity of problems addressed: Patients presentation is most consistent with  acute complicated illness/injury requiring diagnostic workup  Disposition: After consideration of the diagnostic results and the patients response to treatment,  I feel that the patent would benefit from discharge  home with wound care instructions. Imaging negative. Lac repair performed. Advised on f/u plan and strict return precautions. All questions answered, pt stable for discharge.  .    Final Clinical Impression(s) / ED Diagnoses Final diagnoses:  Fall, initial encounter  Facial laceration, initial encounter    Rx / DC Orders ED Discharge Orders     None         Bishop Dublin 01/03/22 0024    Davonna Belling, MD 01/04/22 [id number]

## 2022-01-06 ENCOUNTER — Other Ambulatory Visit: Payer: Self-pay

## 2022-01-06 ENCOUNTER — Encounter (HOSPITAL_COMMUNITY): Payer: Self-pay | Admitting: Emergency Medicine

## 2022-01-06 ENCOUNTER — Emergency Department (HOSPITAL_COMMUNITY)
Admission: EM | Admit: 2022-01-06 | Discharge: 2022-01-06 | Disposition: A | Discharge status: Home / Self Care | Payer: Medicare Other | Attending: Emergency Medicine | Admitting: Emergency Medicine

## 2022-01-06 DIAGNOSIS — R040 Epistaxis: Secondary | ICD-10-CM | POA: Insufficient documentation

## 2022-01-06 MED ORDER — OXYMETAZOLINE HCL 0.05 % NA SOLN
1.0000 | Freq: Once | NASAL | Status: AC
  Administered 2022-01-06: 1 via NASAL
  Filled 2022-01-06: qty 30

## 2022-01-06 NOTE — ED Triage Notes (Signed)
Pt reports a nose bleed x1 and a half hours ago. Bleed lasted approx 1 hour. Pt denies blood thinners. Pt reports falling on Thursday and hitting his head.

## 2022-01-06 NOTE — ED Provider Notes (Signed)
[facility] DEPT Provider Note   CSN: [id number] Arrival date & time: 01/06/22  1529     History  Chief Complaint  Patient presents with   Epistaxis    Jose Bender is a 76 y.o. male who presents from home with epistaxis for 1 hour refractory to direct pressure.  No trauma today.  States he was napping when the bleed started.  He did recently have a fall 3 days ago with extensive facial laceration and bruising .  I personally reviewed this patient's medical records.  He has history of depression, arthritis.  He is not anticoagulated.  HPI     Home Medications Prior to Admission medications   Medication Sig Start Date End Date Taking? Authorizing Provider  acetaminophen (TYLENOL) 500 MG tablet Take 500 mg by mouth as needed (pain).    [provider]  buPROPion (WELLBUTRIN XL) 150 MG 24 hr tablet Take 150 mg by mouth daily.    [provider]  folic acid (FOLVITE) 1 MG tablet Take 1 tablet (1 mg total) by mouth daily. 09/26/21   Medina-Vargas, Monina C, NP  gabapentin (NEURONTIN) 300 MG capsule Take 1 capsule (300 mg total) by mouth 3 (three) times daily. 10/17/21   Wardell Honour, MD  Multiple Vitamin (MULTIVITAMIN WITH MINERALS) TABS tablet Take 1 tablet by mouth daily. 08/13/19   Aline August, MD  ondansetron (ZOFRAN) 4 MG tablet Take 1 tablet (4 mg total) by mouth every 6 (six) hours as needed for nausea. 08/13/19   Aline August, MD  pantoprazole (PROTONIX) 40 MG tablet Take 1 tablet (40 mg total) by mouth 2 (two) times daily before a meal for 60 days, THEN 1 tablet (40 mg total) daily. 09/26/21 11/25/22  Medina-Vargas, Monina C, NP  sertraline (ZOLOFT) 100 MG tablet Take 1 tablet (100 mg total) by mouth at bedtime. 09/26/21   Medina-Vargas, Monina C, NP  thiamine 100 MG tablet Take 1 tablet (100 mg total) by mouth daily. 09/26/21   Medina-Vargas, Monina C, NP      Allergies    Codeine    Review of Systems   Review of  Systems  Constitutional: Negative.   HENT:  Positive for nosebleeds.   Eyes: Negative.   Respiratory: Negative.    Cardiovascular: Negative.   Gastrointestinal: Negative.   Genitourinary: Negative.   Musculoskeletal: Negative.   Skin: Negative.   Neurological: Negative.   Hematological: Negative.    Physical Exam Updated Vital Signs BP (!) 142/79    Pulse 73    Temp 98 F (36.7 C) (Oral)    Resp 18    SpO2 99%  Physical Exam Vitals and nursing note reviewed.  HENT:     Head: Normocephalic and atraumatic.     Nose:     Right Nostril: Epistaxis present. No septal hematoma.     Left Nostril: Epistaxis present. No septal hematoma.     Comments: Died crusted epistaxis in bilateral naris with excoriations on the anterior septum in the left nare.  No septal hematomas or ongoing epistaxis    Mouth/Throat:     Mouth: Mucous membranes are moist.     Pharynx: Oropharynx is clear. Uvula midline.     Comments: Dried blood on the posterior pharynx, presumably from previous epistaxis.  Eyes:     General: No scleral icterus.       Right eye: No discharge.        Left eye: No discharge.     Conjunctiva/sclera: Conjunctivae  normal.  Pulmonary:     Effort: Pulmonary effort is normal.  Skin:    General: Skin is warm and dry.     Capillary Refill: Capillary refill takes less than 2 seconds.  Neurological:     General: No focal deficit present.     Mental Status: He is alert.  Psychiatric:        Mood and Affect: Mood normal.    ED Results / Procedures / Treatments   Labs (all labs ordered are listed, but only abnormal results are displayed) Labs Reviewed - No data to display  EKG None  Radiology No results found.  Procedures Procedures    Medications Ordered in ED Medications  oxymetazoline (AFRIN) 0.05 % nasal spray 1 spray (1 spray Each Nare Given 01/06/22 1550)    ED Course/ Medical Decision Making/ A&P                           Medical Decision Making 76 year old  male who presents with epistaxis at home that lasted for 1 hour today.  Hypertensive on intake vital signs are normal.  Cardiopulmonary exam is normal, abdominal exam is benign.  Patient with dried epistaxis in the nares bilaterally with excoriation of the septum in the left nare suggestive of anterior bleed.  No evidence of posterior bleed.  Risk OTC drugs.   Patient administered afrin in triage with complete resolution of the epistaxis without recurrence.  No further work-up warranted near this time.  Education regarding of epistaxis at home provided.  Ronav voiced understanding with medical evaluation and treatment plan.  She was questions answered to his expressed satisfaction.  Return precautions given.  Patient is well-appearing, stable, and was discharged in good condition.  This chart was dictated using voice recognition software, Dragon. Despite the best efforts of this provider to proofread and correct errors, errors may still occur which can change documentation meaning.    Final Clinical Impression(s) / ED Diagnoses Final diagnoses:  Epistaxis    Rx / DC Orders ED Discharge Orders     None         Aura Dials 01/06/22 2312    Godfrey Pick, MD 01/07/22 [id number]

## 2022-01-06 NOTE — Discharge Instructions (Signed)
You are seen in the ER today from your nosebleed.  It was resolved using administration of medication in the nose.  You may follow-up with your primary care doctor.  Return to the ER with any new severe symptoms.

## 2022-01-06 NOTE — ED Provider Triage Note (Signed)
Emergency Medicine Provider Triage Evaluation Note  Jose Bender , a 76 y.o. male  was evaluated in triage.  Pt complains of epistaxis onset prior to arrival.  Denies anticoagulant use.  Patient is he has had bleeding from the left nare for approximately 1 hour with no relief with manual compression.  Denies shortness of breath, palpitations, dizziness, lightheadedness.  Denies past medical history of anemia or taking anticoagulants.  Review of Systems  Positive: As per HPI above Negative:   Physical Exam  BP (!) 165/85    Pulse 68    Temp 98 F (36.7 C) (Oral)    Resp 20    SpO2 99%  Gen:   Awake, no distress   Resp:  Normal effort  MSK:   Moves extremities without difficulty  Other:  Bleeding controlled in the ED.  No evidence of septal hematoma.   Medical Decision Making  Medically screening exam initiated at 3:48 PM.  Appropriate orders placed.  Jose Bender was informed that the remainder of the evaluation will be completed by another provider, this initial triage assessment does not replace that evaluation, and the importance of remaining in the ED until their evaluation is complete.  3:38 PM - Afrin applied in triage by RN with myself present.   4:45 PM - Reassessed by myself prior to being moved to room from triage, no bleeding from nare.   Jose Bender A, PA-C 01/06/22 1646

## 2022-01-16 DIAGNOSIS — F410 Panic disorder [episodic paroxysmal anxiety] without agoraphobia: Secondary | ICD-10-CM | POA: Diagnosis not present

## 2022-01-16 DIAGNOSIS — F331 Major depressive disorder, recurrent, moderate: Secondary | ICD-10-CM | POA: Diagnosis not present

## 2022-01-29 ENCOUNTER — Other Ambulatory Visit: Payer: Self-pay

## 2022-01-29 ENCOUNTER — Ambulatory Visit (INDEPENDENT_AMBULATORY_CARE_PROVIDER_SITE_OTHER): Payer: Medicare Other | Admitting: Family Medicine

## 2022-01-29 ENCOUNTER — Encounter: Payer: Self-pay | Admitting: Family Medicine

## 2022-01-29 VITALS — BP 138/82 | HR 63 | Temp 97.3°F | Ht 69.0 in | Wt 183.2 lb

## 2022-01-29 DIAGNOSIS — G9340 Encephalopathy, unspecified: Secondary | ICD-10-CM | POA: Diagnosis not present

## 2022-01-29 DIAGNOSIS — F32A Depression, unspecified: Secondary | ICD-10-CM | POA: Diagnosis not present

## 2022-01-29 DIAGNOSIS — K922 Gastrointestinal hemorrhage, unspecified: Secondary | ICD-10-CM | POA: Diagnosis not present

## 2022-01-29 NOTE — Progress Notes (Signed)
? ? ?Provider:  ?Alain Honey, MD ? ?Careteam: ?Patient Care Team: ?Wardell Honour, MD as PCP - General (Family Medicine) ?Otis Brace, MD as Consulting Physician (Gastroenterology) ? ?PLACE OF SERVICE:  ?[facility] CLINIC  ?Advanced Directive information ?  ? ?Allergies  ?Allergen Reactions  ? Codeine Nausea Only  ? ? ?Chief Complaint  ?Patient presents with  ? Acute Visit  ?  Patient presents today for ER follow-up. He was seen at [facility] on 01/02/22 for a fall and injury to his center forehead. He reports unsteadiness. He reports sutures was placed on forehead.  ? ? ? ?HPI: Patient is a 76 y.o. male .  As noted above he had a recent fall suffering laceration of his forehead and nose bleed.  Had subsequent nosebleed 2 days later that seems to have resolved.  He fell forward even though he was using his walker.  He has now given up driving because he did not feel safe. ?He has seen gastroenterology who suggested he continue with pantoprazole.  Previously had GI bleed. ?He continues to live independently and does his own meal preparation. ?He would like to discontinue gabapentin and we talked about tapering that over the course of several weeks. ? ?Review of Systems:  ?Review of Systems  ?Constitutional: Negative.   ?HENT:  Positive for nosebleeds.   ?Respiratory: Negative.    ?Cardiovascular: Negative.   ?Genitourinary: Negative.   ?Neurological: Negative.   ? ?Past Medical History:  ?Diagnosis Date  ? Arthritis   ? Depression   ? ?Past Surgical History:  ?Procedure Laterality Date  ? BACK SURGERY    ? BIOPSY  08/12/2019  ? Procedure: BIOPSY;  Surgeon: Otis Brace, MD;  Location: [facility] ENDOSCOPY;  Service: Gastroenterology;;  ? BIOPSY  09/04/2021  ? Procedure: BIOPSY;  Surgeon: Otis Brace, MD;  Location: [facility] ENDOSCOPY;  Service: Gastroenterology;;  ? CATARACT EXTRACTION    ? Per [facility] New Patient Packet  ? COLONOSCOPY    ? Per [facility] New Patient Packet  ? ESOPHAGOGASTRODUODENOSCOPY N/A  09/04/2021  ? Procedure: ESOPHAGOGASTRODUODENOSCOPY (EGD);  Surgeon: Otis Brace, MD;  Location: [facility] ENDOSCOPY;  Service: Gastroenterology;  Laterality: N/A;  ? ESOPHAGOGASTRODUODENOSCOPY (EGD) WITH PROPOFOL N/A 08/12/2019  ? Procedure: ESOPHAGOGASTRODUODENOSCOPY (EGD) WITH PROPOFOL;  Surgeon: Otis Brace, MD;  Location: [facility] ENDOSCOPY;  Service: Gastroenterology;  Laterality: N/A;  ? EYE SURGERY Bilateral   ? cataracts  ? KNEE ARTHROSCOPY Left   ? LUMBAR LAMINECTOMY/DECOMPRESSION MICRODISCECTOMY N/A 06/27/2019  ? Procedure: L3-4 HEMILAMINECTOMY DISCECTOMY;  Surgeon: Deetta Perla, MD;  Location: [facility] ORS;  Service: Neurosurgery;  Laterality: N/A;  ? UPPER GI ENDOSCOPY  2021  ? Per [facility] New Patient Packet, Dr.Brahmbatt  ? ?Social History: ?  reports that he quit smoking about 17 years ago. His smoking use included cigarettes. He has a 15.00 pack-year smoking history. He has never used smokeless tobacco. He reports that he does not currently use alcohol. He reports that he does not use drugs. ? ?Family History  ?Problem Relation Age of Onset  ? Stroke Mother   ? Pneumonia Mother   ? Arthritis Mother   ? Cancer Father   ? Stroke Maternal Grandmother   ? Cancer Maternal Grandfather   ? ? ?Medications: ?Patient's Medications  ?New Prescriptions  ? No medications on file  ?Previous Medications  ? ACETAMINOPHEN (TYLENOL) 500 MG TABLET    Take 500 mg by mouth as needed (pain).  ? BUPROPION (WELLBUTRIN XL) 150 MG 24 HR TABLET    Take  150 mg by mouth daily.  ? GABAPENTIN (NEURONTIN) 300 MG CAPSULE    Take 1 capsule (300 mg total) by mouth 3 (three) times daily.  ? MULTIPLE VITAMIN (MULTIVITAMIN WITH MINERALS) TABS TABLET    Take 1 tablet by mouth daily.  ? PANTOPRAZOLE (PROTONIX) 40 MG TABLET    Take 1 tablet (40 mg total) by mouth 2 (two) times daily before a meal for 60 days, THEN 1 tablet (40 mg total) daily.  ? SERTRALINE (ZOLOFT) 100 MG TABLET    Take 1 tablet (100 mg total) by mouth at bedtime.  ?Modified  Medications  ? No medications on file  ?Discontinued Medications  ? FOLIC ACID (FOLVITE) 1 MG TABLET    Take 1 tablet (1 mg total) by mouth daily.  ? ONDANSETRON (ZOFRAN) 4 MG TABLET    Take 1 tablet (4 mg total) by mouth every 6 (six) hours as needed for nausea.  ? THIAMINE 100 MG TABLET    Take 1 tablet (100 mg total) by mouth daily.  ? ? ?Physical Exam: ? ?Vitals:  ? 01/29/22 1031  ?BP: 138/82  ?Pulse: 63  ?Temp: (!) 97.3 ?F (36.3 ?C)  ?SpO2: 99%  ?Weight: 183 lb 3.2 oz (83.1 kg)  ?Height: '5\' 9"'$  (1.753 m)  ? ?Body mass index is 27.05 kg/m?. ?Wt Readings from Last 3 Encounters:  ?01/29/22 183 lb 3.2 oz (83.1 kg)  ?01/02/22 189 lb 9.5 oz (86 kg)  ?10/17/21 189 lb 9.6 oz (86 kg)  ? ? ?Physical Exam ?Vitals and nursing note reviewed.  ?Constitutional:   ?   Appearance: Normal appearance.  ?HENT:  ?   Head: Normocephalic.  ?   Comments: Wound on forehead is healing well without evidence of infection.  He tells me the stitches are absorbable.  It has been 2 weeks. ?Cardiovascular:  ?   Rate and Rhythm: Normal rate and regular rhythm.  ?Pulmonary:  ?   Effort: Pulmonary effort is normal.  ?   Breath sounds: Normal breath sounds.  ?Abdominal:  ?   General: Abdomen is flat. Bowel sounds are normal.  ?   Palpations: Abdomen is soft.  ?Neurological:  ?   General: No focal deficit present.  ?   Mental Status: He is alert and oriented to person, place, and time.  ? ? ?Labs reviewed: ?Basic Metabolic Panel: ?Recent Labs  ?  09/02/21 ?0008 09/02/21 ?6812 09/03/21 ?0331 09/05/21 ?0336 09/06/21 ?0330 09/08/21 ?0318 09/18/21 ?0000  ?NA  --  137   < > 135 135 137 147  ?K  --  3.6   < > 3.8 3.6 3.8 4.0  ?CL  --  105   < > 101 100 100 111*  ?CO2  --  18*   < > '26 26 29 '$ 23*  ?GLUCOSE  --  84   < > 106* 108* 81  --   ?BUN  --  33*   < > 15 26* 33* 16  ?CREATININE  --  1.11   < > 0.87 0.78 1.15 0.9  ?CALCIUM  --  9.4   < > 9.4 9.3 9.4 8.9  ?MG 2.6*  --    < > 2.0 2.3 2.1  --   ?PHOS 2.0*  --   --   --   --   --   --   ?TSH 0.494 0.432   --   --   --   --   --   ? < > = values in  this interval not displayed.  ? ?Liver Function Tests: ?Recent Labs  ?  09/01/21 ?1710 09/02/21 ?0349 09/03/21 ?0331  ?AST '29 25 19  '$ ?ALT '23 20 15  '$ ?ALKPHOS 64 61 53  ?BILITOT 1.9* 1.7* 1.4*  ?PROT 7.5 7.1 5.5*  ?ALBUMIN 4.3 4.1 3.2*  ? ?No results for input(s): LIPASE, AMYLASE in the last 8760 hours. ?Recent Labs  ?  09/02/21 ?0008  ?AMMONIA 28  ? ?CBC: ?Recent Labs  ?  09/01/21 ?1710 09/02/21 ?0349 09/03/21 ?0331 09/05/21 ?0336 09/06/21 ?0330 09/08/21 ?0318 09/18/21 ?0000  ?WBC 13.2* 10.0   < > 8.4 8.3 10.9* 5.5  ?NEUTROABS 11.0* 7.6  --   --   --   --   --   ?HGB 16.4 15.9   < > 14.3 15.0 15.0 12.6*  ?HCT 50.6 49.2   < > 43.5 45.6 46.1 38*  ?MCV 90.8 91.8   < > 89.5 90.5 91.3  --   ?PLT 218 185   < > 140* 152 198 157  ? < > = values in this interval not displayed.  ? ?Lipid Panel: ?Recent Labs  ?  09/16/21 ?0000  ?CHOL 182  ?HDL 48  ?[place] 118  ?TRIG 81  ? ?TSH: ?Recent Labs  ?  09/02/21 ?0008 09/02/21 ?0349  ?TSH 0.494 0.432  ? ?A1C: ?Lab Results  ?Component Value Date  ? HGBA1C 5.0 09/16/2021  ? ? ? ?Assessment/Plan ? ?1. Acute encephalopathy ?No recent issues but I suspect is giving up driving had something to do with symptoms he had ? ?2. Acute upper GI bleed ?No recent blood loss.  We will continue PPI indefinitely per gastroenterology ? ? ?3. Depression, unspecified depression type ?Continues on sertraline as well as bupropion.  Plan, as before, is to taper sertraline per his mental health provider ? ?Alain Honey, MD ?[facility] ?[phone]  ? ?

## 2022-02-14 DIAGNOSIS — F331 Major depressive disorder, recurrent, moderate: Secondary | ICD-10-CM | POA: Diagnosis not present

## 2022-02-14 DIAGNOSIS — F410 Panic disorder [episodic paroxysmal anxiety] without agoraphobia: Secondary | ICD-10-CM | POA: Diagnosis not present

## 2022-02-18 ENCOUNTER — Ambulatory Visit (INDEPENDENT_AMBULATORY_CARE_PROVIDER_SITE_OTHER): Payer: Medicare Other | Admitting: Family Medicine

## 2022-02-18 ENCOUNTER — Encounter: Payer: Self-pay | Admitting: Family Medicine

## 2022-02-18 VITALS — BP 116/64 | HR 69 | Temp 96.4°F | Ht 69.0 in | Wt 181.4 lb

## 2022-02-18 DIAGNOSIS — K922 Gastrointestinal hemorrhage, unspecified: Secondary | ICD-10-CM

## 2022-02-18 DIAGNOSIS — G9340 Encephalopathy, unspecified: Secondary | ICD-10-CM

## 2022-02-18 NOTE — Progress Notes (Signed)
? ? ?Provider:  ?Alain Honey, MD ? ?Careteam: ?Patient Care Team: ?Wardell Honour, MD as PCP - General (Family Medicine) ?Otis Brace, MD as Consulting Physician (Gastroenterology) ? ?PLACE OF SERVICE:  ?[facility]  ?Advanced Directive information ?  ? ?Allergies  ?Allergen Reactions  ? Codeine Nausea Only  ? ? ?Chief Complaint  ?Patient presents with  ? Medical Management of Chronic Issues  ?  Patient presents today for a 4 month follow-up.  ? Quality Metric Gaps  ?  Hep C screening, colonoscopy, pneumonia, COVID booster #3  ? ? ? ?HPI: Patient is a 76 y.o. male patient presents today for follow-up still has some lightheadedness especially upon standing he does use the Epley maneuver when symptoms warrant.  He still has sutures in mid forehead that have been in place now since his last visit.  He was told the sutures would dissolve but that is not the case.  Sutures easily removed.  There is some surrounding erythema which 1 might expect given the longevity of sutures being in scan but wound looks well-healed. ? ?Review of Systems:  ?Review of Systems  ?Constitutional: Negative.   ?HENT: Negative.    ?Respiratory: Negative.    ?Cardiovascular: Negative.   ?Neurological:  Positive for tremors.  ?All other systems reviewed and are negative. ? ?Past Medical History:  ?Diagnosis Date  ? Arthritis   ? Depression   ? ?Past Surgical History:  ?Procedure Laterality Date  ? BACK SURGERY    ? BIOPSY  08/12/2019  ? Procedure: BIOPSY;  Surgeon: Otis Brace, MD;  Location: [facility] ENDOSCOPY;  Service: Gastroenterology;;  ? BIOPSY  09/04/2021  ? Procedure: BIOPSY;  Surgeon: Otis Brace, MD;  Location: [facility] ENDOSCOPY;  Service: Gastroenterology;;  ? CATARACT EXTRACTION    ? Per [facility] New Patient Packet  ? COLONOSCOPY    ? Per [facility] New Patient Packet  ? ESOPHAGOGASTRODUODENOSCOPY N/A 09/04/2021  ? Procedure: ESOPHAGOGASTRODUODENOSCOPY (EGD);  Surgeon: Otis Brace, MD;  Location: [facility] ENDOSCOPY;  Service:  Gastroenterology;  Laterality: N/A;  ? ESOPHAGOGASTRODUODENOSCOPY (EGD) WITH PROPOFOL N/A 08/12/2019  ? Procedure: ESOPHAGOGASTRODUODENOSCOPY (EGD) WITH PROPOFOL;  Surgeon: Otis Brace, MD;  Location: [facility] ENDOSCOPY;  Service: Gastroenterology;  Laterality: N/A;  ? EYE SURGERY Bilateral   ? cataracts  ? KNEE ARTHROSCOPY Left   ? LUMBAR LAMINECTOMY/DECOMPRESSION MICRODISCECTOMY N/A 06/27/2019  ? Procedure: L3-4 HEMILAMINECTOMY DISCECTOMY;  Surgeon: Deetta Perla, MD;  Location: [facility] ORS;  Service: Neurosurgery;  Laterality: N/A;  ? UPPER GI ENDOSCOPY  2021  ? Per [facility] New Patient Packet, Dr.Brahmbatt  ? ?Social History: ?  reports that he quit smoking about 17 years ago. His smoking use included cigarettes. He has a 15.00 pack-year smoking history. He has never used smokeless tobacco. He reports that he does not currently use alcohol. He reports that he does not use drugs. ? ?Family History  ?Problem Relation Age of Onset  ? Stroke Mother   ? Pneumonia Mother   ? Arthritis Mother   ? Cancer Father   ? Stroke Maternal Grandmother   ? Cancer Maternal Grandfather   ? ? ?Medications: ?Patient's Medications  ?New Prescriptions  ? No medications on file  ?Previous Medications  ? ACETAMINOPHEN (TYLENOL) 500 MG TABLET    Take 500 mg by mouth as needed (pain).  ? BUPROPION (WELLBUTRIN XL) 150 MG 24 HR TABLET    Take 150 mg by mouth daily.  ? GABAPENTIN (NEURONTIN) 300 MG CAPSULE    Take 1 capsule (300 mg total) by mouth  3 (three) times daily.  ? MULTIPLE VITAMIN (MULTIVITAMIN WITH MINERALS) TABS TABLET    Take 1 tablet by mouth daily.  ? PANTOPRAZOLE (PROTONIX) 40 MG TABLET    Take 1 tablet (40 mg total) by mouth 2 (two) times daily before a meal for 60 days, THEN 1 tablet (40 mg total) daily.  ? SERTRALINE (ZOLOFT) 100 MG TABLET    Take 1 tablet (100 mg total) by mouth at bedtime.  ?Modified Medications  ? No medications on file  ?Discontinued Medications  ? No medications on file  ? ? ?Physical Exam: ? ?There were no  vitals filed for this visit. ?There is no height or weight on file to calculate BMI. ?Wt Readings from Last 3 Encounters:  ?01/29/22 183 lb 3.2 oz (83.1 kg)  ?01/02/22 189 lb 9.5 oz (86 kg)  ?10/17/21 189 lb 9.6 oz (86 kg)  ? ? ?Physical Exam ?Vitals and nursing note reviewed.  ?Constitutional:   ?   Appearance: Normal appearance.  ?Cardiovascular:  ?   Rate and Rhythm: Normal rate and regular rhythm.  ?Pulmonary:  ?   Effort: Pulmonary effort is normal.  ?   Breath sounds: Normal breath sounds.  ?Abdominal:  ?   General: Abdomen is flat. Bowel sounds are normal.  ?   Palpations: Abdomen is soft.  ?Neurological:  ?   General: No focal deficit present.  ?   Mental Status: He is alert.  ?   Gait: Gait abnormal.  ?   Comments: Mild intention tremor noted (positive family history) ? ?Patient walks with a cane gait is fairly broad-based  ? ? ?Labs reviewed: ?Basic Metabolic Panel: ?Recent Labs  ?  09/02/21 ?0008 09/02/21 ?3329 09/03/21 ?0331 09/05/21 ?0336 09/06/21 ?0330 09/08/21 ?0318 09/18/21 ?0000  ?NA  --  137   < > 135 135 137 147  ?K  --  3.6   < > 3.8 3.6 3.8 4.0  ?CL  --  105   < > 101 100 100 111*  ?CO2  --  18*   < > '26 26 29 '$ 23*  ?GLUCOSE  --  84   < > 106* 108* 81  --   ?BUN  --  33*   < > 15 26* 33* 16  ?CREATININE  --  1.11   < > 0.87 0.78 1.15 0.9  ?CALCIUM  --  9.4   < > 9.4 9.3 9.4 8.9  ?MG 2.6*  --    < > 2.0 2.3 2.1  --   ?PHOS 2.0*  --   --   --   --   --   --   ?TSH 0.494 0.432  --   --   --   --   --   ? < > = values in this interval not displayed.  ? ?Liver Function Tests: ?Recent Labs  ?  09/01/21 ?1710 09/02/21 ?0349 09/03/21 ?0331  ?AST '29 25 19  '$ ?ALT '23 20 15  '$ ?ALKPHOS 64 61 53  ?BILITOT 1.9* 1.7* 1.4*  ?PROT 7.5 7.1 5.5*  ?ALBUMIN 4.3 4.1 3.2*  ? ?No results for input(s): LIPASE, AMYLASE in the last 8760 hours. ?Recent Labs  ?  09/02/21 ?0008  ?AMMONIA 28  ? ?CBC: ?Recent Labs  ?  09/01/21 ?1710 09/02/21 ?0349 09/03/21 ?0331 09/05/21 ?0336 09/06/21 ?0330 09/08/21 ?0318 09/18/21 ?0000  ?WBC  13.2* 10.0   < > 8.4 8.3 10.9* 5.5  ?NEUTROABS 11.0* 7.6  --   --   --   --   --   ?  HGB 16.4 15.9   < > 14.3 15.0 15.0 12.6*  ?HCT 50.6 49.2   < > 43.5 45.6 46.1 38*  ?MCV 90.8 91.8   < > 89.5 90.5 91.3  --   ?PLT 218 185   < > 140* 152 198 157  ? < > = values in this interval not displayed.  ? ?Lipid Panel: ?Recent Labs  ?  09/16/21 ?0000  ?CHOL 182  ?HDL 48  ?[place] 118  ?TRIG 81  ? ?TSH: ?Recent Labs  ?  09/02/21 ?0008 09/02/21 ?0349  ?TSH 0.494 0.432  ? ?A1C: ?Lab Results  ?Component Value Date  ? HGBA1C 5.0 09/16/2021  ? ? ? ?Assessment/Plan ? ?1. Acute encephalopathy ?No issues since acute episode which seem to be related to acute blood loss secondary to upper GI bleed. ? ?2. Acute upper GI bleed ?No further bleeding continues on Protonix daily ? ? ?Alain Honey, MD ?[facility] ?[phone]  ? ?

## 2022-02-20 ENCOUNTER — Other Ambulatory Visit: Payer: Self-pay | Admitting: Family Medicine

## 2022-02-20 DIAGNOSIS — G8929 Other chronic pain: Secondary | ICD-10-CM

## 2022-02-27 DIAGNOSIS — F410 Panic disorder [episodic paroxysmal anxiety] without agoraphobia: Secondary | ICD-10-CM | POA: Diagnosis not present

## 2022-02-27 DIAGNOSIS — F331 Major depressive disorder, recurrent, moderate: Secondary | ICD-10-CM | POA: Diagnosis not present

## 2022-03-12 DIAGNOSIS — F331 Major depressive disorder, recurrent, moderate: Secondary | ICD-10-CM | POA: Diagnosis not present

## 2022-03-12 DIAGNOSIS — F410 Panic disorder [episodic paroxysmal anxiety] without agoraphobia: Secondary | ICD-10-CM | POA: Diagnosis not present

## 2022-03-26 DIAGNOSIS — F410 Panic disorder [episodic paroxysmal anxiety] without agoraphobia: Secondary | ICD-10-CM | POA: Diagnosis not present

## 2022-03-26 DIAGNOSIS — F331 Major depressive disorder, recurrent, moderate: Secondary | ICD-10-CM | POA: Diagnosis not present

## 2022-03-27 DIAGNOSIS — K209 Esophagitis, unspecified without bleeding: Secondary | ICD-10-CM | POA: Diagnosis not present

## 2022-03-27 DIAGNOSIS — K2930 Chronic superficial gastritis without bleeding: Secondary | ICD-10-CM | POA: Diagnosis not present

## 2022-03-27 DIAGNOSIS — K2950 Unspecified chronic gastritis without bleeding: Secondary | ICD-10-CM | POA: Diagnosis not present

## 2022-03-27 DIAGNOSIS — K222 Esophageal obstruction: Secondary | ICD-10-CM | POA: Diagnosis not present

## 2022-03-27 DIAGNOSIS — K449 Diaphragmatic hernia without obstruction or gangrene: Secondary | ICD-10-CM | POA: Diagnosis not present

## 2022-03-31 DIAGNOSIS — K2930 Chronic superficial gastritis without bleeding: Secondary | ICD-10-CM | POA: Diagnosis not present

## 2022-04-23 DIAGNOSIS — F331 Major depressive disorder, recurrent, moderate: Secondary | ICD-10-CM | POA: Diagnosis not present

## 2022-05-28 DIAGNOSIS — F331 Major depressive disorder, recurrent, moderate: Secondary | ICD-10-CM | POA: Diagnosis not present

## 2022-06-18 DIAGNOSIS — F331 Major depressive disorder, recurrent, moderate: Secondary | ICD-10-CM | POA: Diagnosis not present

## 2022-07-02 DIAGNOSIS — F331 Major depressive disorder, recurrent, moderate: Secondary | ICD-10-CM | POA: Diagnosis not present

## 2022-07-16 DIAGNOSIS — F331 Major depressive disorder, recurrent, moderate: Secondary | ICD-10-CM | POA: Diagnosis not present

## 2022-08-01 DIAGNOSIS — F331 Major depressive disorder, recurrent, moderate: Secondary | ICD-10-CM | POA: Diagnosis not present

## 2022-08-05 DIAGNOSIS — Z23 Encounter for immunization: Secondary | ICD-10-CM | POA: Diagnosis not present

## 2022-08-15 DIAGNOSIS — F331 Major depressive disorder, recurrent, moderate: Secondary | ICD-10-CM | POA: Diagnosis not present

## 2022-08-15 DIAGNOSIS — F410 Panic disorder [episodic paroxysmal anxiety] without agoraphobia: Secondary | ICD-10-CM | POA: Diagnosis not present

## 2022-09-08 DIAGNOSIS — F331 Major depressive disorder, recurrent, moderate: Secondary | ICD-10-CM | POA: Diagnosis not present

## 2022-09-25 DIAGNOSIS — F410 Panic disorder [episodic paroxysmal anxiety] without agoraphobia: Secondary | ICD-10-CM | POA: Diagnosis not present

## 2022-09-25 DIAGNOSIS — F331 Major depressive disorder, recurrent, moderate: Secondary | ICD-10-CM | POA: Diagnosis not present

## 2022-10-23 DIAGNOSIS — F331 Major depressive disorder, recurrent, moderate: Secondary | ICD-10-CM | POA: Diagnosis not present

## 2022-10-29 DIAGNOSIS — Z23 Encounter for immunization: Secondary | ICD-10-CM | POA: Diagnosis not present

## 2022-10-30 ENCOUNTER — Other Ambulatory Visit: Payer: Self-pay | Admitting: Adult Health

## 2022-10-30 DIAGNOSIS — K922 Gastrointestinal hemorrhage, unspecified: Secondary | ICD-10-CM

## 2022-11-20 ENCOUNTER — Ambulatory Visit: Payer: Medicare Other | Admitting: Family

## 2022-11-27 DIAGNOSIS — F331 Major depressive disorder, recurrent, moderate: Secondary | ICD-10-CM | POA: Diagnosis not present

## 2022-11-28 ENCOUNTER — Encounter: Payer: Self-pay | Admitting: Family

## 2022-11-28 ENCOUNTER — Ambulatory Visit (INDEPENDENT_AMBULATORY_CARE_PROVIDER_SITE_OTHER): Payer: Medicare Other | Admitting: Family

## 2022-11-28 VITALS — BP 142/86 | HR 88 | Temp 97.6°F | Resp 16 | Ht 69.0 in | Wt 178.4 lb

## 2022-11-28 DIAGNOSIS — K4090 Unilateral inguinal hernia, without obstruction or gangrene, not specified as recurrent: Secondary | ICD-10-CM | POA: Diagnosis not present

## 2022-11-28 NOTE — Patient Instructions (Signed)
Inguinal Hernia, Adult An inguinal hernia is when fat or your intestines push through a weak spot in a muscle where your leg meets your lower belly (groin). This causes a bulge. This kind of hernia could also be: In your scrotum, if you are male. In folds of skin around your vagina, if you are male. There are three types of inguinal hernias: Hernias that can be pushed back into the belly (are reducible). This type rarely causes pain. Hernias that cannot be pushed back into the belly (are incarcerated). Hernias that cannot be pushed back into the belly and lose their blood supply (are strangulated). This type needs emergency surgery. What are the causes? This condition is caused by having a weak spot in the muscles or tissues in your groin. This develops over time. The hernia may poke through the weak spot when you strain your lower belly muscles all of a sudden, such as when you: Lift a heavy object. Strain to poop (have a bowel movement). Trouble pooping (constipation) can lead to straining. Cough. What increases the risk? This condition is more likely to develop in: Males. Pregnant females. People who: Are overweight. Work in jobs that require long periods of standing or heavy lifting. Have had an inguinal hernia before. Smoke or have lung disease. These factors can lead to long-term (chronic) coughing. What are the signs or symptoms? Symptoms may depend on the size of the hernia. Often, a small hernia has no symptoms. Symptoms of a larger hernia may include: A bulge in the groin area. This is easier to see when standing. You might not be able to see it when you are lying down. Pain or burning in the groin. This may get worse when you lift, strain, or cough. A dull ache or a feeling of pressure in the groin. An abnormal bulge in the scrotum, in males. Symptoms of a strangulated inguinal hernia may include: A bulge in your groin that is very painful and tender to the touch. A bulge  that turns red or purple. Fever, feeling like you may vomit (nausea), and vomiting. Not being able to poop or to pass gas. How is this treated? Treatment depends on the size of your hernia and whether you have symptoms. If you do not have symptoms, your doctor may have you watch your hernia carefully and have you come in for follow-up visits. If your hernia is large or if you have symptoms, you may need surgery to repair the hernia. Follow these instructions at home: Lifestyle Avoid lifting heavy objects. Avoid standing for long amounts of time. Do not smoke or use any products that contain nicotine or tobacco. If you need help quitting, ask your doctor. Stay at a healthy weight. Prevent trouble pooping You may need to take these actions to prevent or treat trouble pooping: Drink enough fluid to keep your pee (urine) pale yellow. Take over-the-counter or prescription medicines. Eat foods that are high in fiber. These include beans, whole grains, and fresh fruits and vegetables. Limit foods that are high in fat and sugar. These include fried or sweet foods. General instructions You may try to push your hernia back in place by very gently pressing on it when you are lying down. Do not try to push the bulge back in if it will not go in easily. Watch your hernia for any changes in shape, size, or color. Tell your doctor if you see any changes. Take over-the-counter and prescription medicines only as told by your doctor. Keep  all follow-up visits. Contact a doctor if: You have a fever or chills. You have new symptoms. Your symptoms get worse. Get help right away if: You have pain in your groin that gets worse all of a sudden. You have a bulge in your groin that: Gets bigger all of a sudden, and it does not get smaller after that. Turns red or purple. Is painful when you touch it. You are a male, and you have: Sudden pain in your scrotum. A sudden change in the size of your scrotum. You  cannot push the hernia back in place by very gently pressing on it when you are lying down. You feel like you may vomit, and that feeling does not go away. You keep vomiting. You have a fast heartbeat. You cannot poop or pass gas. These symptoms may be an emergency. Get help right away. Call your local emergency services (911 in the [place] Do not wait to see if the symptoms will go away. Do not drive yourself to the hospital. Summary An inguinal hernia is when fat or your intestines push through a weak spot in a muscle where your leg meets your lower belly (groin). This causes a bulge. If you do not have symptoms, you may not need treatment. If you have symptoms or a large hernia, you may need surgery. Avoid lifting heavy objects. Also, avoid standing for long amounts of time. Do not try to push the bulge back in if it will not go in easily. This information is not intended to replace advice given to you by your health care provider. Make sure you discuss any questions you have with your health care provider. Document Revised: 07/03/2020 Document Reviewed: 07/03/2020 Elsevier Patient Education  [place].

## 2022-11-28 NOTE — Progress Notes (Signed)
Provider: Bonnetta Allbee FNP-C  Jose Honour, MD  Patient Care Team: Jose Honour, MD as PCP - General (Family Medicine) Jose Brace, MD as Consulting Physician (Gastroenterology)  Extended Emergency Contact Information Primary Emergency Contact: Jose, [place] [address] [place] Phone: [phone] Relation: Friend Secondary Emergency Contact: Jose [place], [place] Phone: [phone] Relation: Friend Preferred language: English Interpreter needed? No  Code Status:  Full Code  Goals of care: Advanced Directive information    11/28/2022    2:32 PM  Advanced Directives  Does Patient Have a Medical Advance Directive? Yes  Type of Advance Directive Living will  Does patient want to make changes to medical advance directive? No - Patient declined     Chief Complaint  Patient presents with   Acute Visit    Patient complains of possible Hernia on left side.    HPI:  Pt is a 77 y.o. male seen today for an acute visit for evaluation of left side hernia.states hernia gets big whenever he seats too long when working on his computer.Also tends to be big when standing.Hernia goes away when he lies with legs straight in the bed, He denies any pain or strangulation.Also denies any issues with constipation.   Past Medical History:  Diagnosis Date   Arthritis    Depression    Past Surgical History:  Procedure Laterality Date   BACK SURGERY     BIOPSY  08/12/2019   Procedure: BIOPSY;  Surgeon: Jose Brace, MD;  Location: [facility];  Service: Gastroenterology;;   BIOPSY  09/04/2021   Procedure: BIOPSY;  Surgeon: Jose Brace, MD;  [facility];  Service: Gastroenterology;;   CATARACT EXTRACTION     Per [facility] New Patient Packet   COLONOSCOPY     Per [place] Patient Packet   ESOPHAGOGASTRODUODENOSCOPY N/A 09/04/2021   Procedure:  ESOPHAGOGASTRODUODENOSCOPY (EGD);  Surgeon: Jose Brace, MD;  Location: [facility];  Service: Gastroenterology;  Laterality: N/A;   ESOPHAGOGASTRODUODENOSCOPY (EGD) WITH PROPOFOL N/A 08/12/2019   Procedure: ESOPHAGOGASTRODUODENOSCOPY (EGD) WITH PROPOFOL;  Surgeon: Jose Brace, MD;  Location: [place];  Service: Gastroenterology;  Laterality: N/A;   EYE SURGERY Bilateral    cataracts   KNEE ARTHROSCOPY Left    LUMBAR LAMINECTOMY/DECOMPRESSION MICRODISCECTOMY N/A 06/27/2019   Procedure: L3-4 HEMILAMINECTOMY DISCECTOMY;  Surgeon: Jose Perla, MD;  Location: [facility];  Service: Neurosurgery;  Laterality: N/A;   UPPER GI ENDOSCOPY  2021   Per [place] New Patient Packet, Dr.Brahmbatt    Allergies  Allergen Reactions   Codeine Nausea Only    Outpatient Encounter Medications as of 11/28/2022  Medication Sig   acetaminophen (TYLENOL) 500 MG tablet Take 500 mg by mouth as needed (pain).   buPROPion (WELLBUTRIN XL) 150 MG 24 hr tablet Take 150 mg by mouth daily.   gabapentin (NEURONTIN) 300 MG capsule TAKE ONE CAPSULE BY MOUTH THREE TIMES A DAY   Multiple Vitamin (MULTIVITAMIN WITH MINERALS) TABS tablet Take 1 tablet by mouth daily.   pantoprazole (PROTONIX) 40 MG tablet Take 1 tablet (40 mg total) by mouth daily.   sertraline (ZOLOFT) 100 MG tablet Take 1 tablet (100 mg total) by mouth at bedtime.   No facility-administered encounter medications on file as of 11/28/2022.    Review of Systems  Constitutional:  Negative for appetite change, chills, fatigue and fever.  Respiratory:  Negative for  cough, chest tightness and wheezing.   Cardiovascular:  Negative for chest pain, palpitations and leg swelling.  Gastrointestinal:  Negative for abdominal distention, abdominal pain, constipation, nausea and vomiting.  Skin:  Negative for color change, pallor and rash.    Immunization History  Administered Date(s) Administered   Fluad Quad(high Dose 65+) 08/17/2021    Influenza-Unspecified 11/05/2011   Moderna SARS-COV2 Booster Vaccination 09/10/2020   Moderna Sars-Covid-2 Vaccination 12/31/2019, 01/28/2020   Pneumococcal Polysaccharide-23 09/20/2013   Td 02/17/2012   Td (Adult),5 Lf Tetanus Toxid, Preservative Free 02/17/2012   Tdap 01/02/2022   Zoster Recombinat (Shingrix) 08/30/2018, 12/01/2018   Pertinent  Health Maintenance Due  Topic Date Due   COLONOSCOPY (Pts 45-54yr Insurance coverage will need to be confirmed)  Never done   INFLUENZA VACCINE  06/17/2022      01/02/2022    6:55 PM 01/06/2022    3:37 PM 01/29/2022   10:37 AM 02/18/2022    1:05 PM 11/28/2022    2:31 PM  Fall Risk  Falls in the past year?   1 1 0  Was there an injury with Fall?   1 1 0  Fall Risk Category Calculator   3 3 0  Fall Risk Category   High High Low  Patient Fall Risk Level Moderate fall risk Low fall risk Moderate fall risk Moderate fall risk Low fall risk  Patient at Risk for Falls Due to   History of fall(s) History of fall(s) No Fall Risks  Fall risk Follow up   Falls evaluation completed Falls evaluation completed Falls evaluation completed   Functional Status Survey:    Vitals:   11/28/22 1425  BP: (!) 142/86  Pulse: 88  Resp: 16  Temp: 97.6 F (36.4 C)  SpO2: 98%  Weight: 178 lb 6.4 oz (80.9 kg)  Height: '5\' 9"'$  (1.753 m)   Body mass index is 26.35 kg/m. Physical Exam Vitals reviewed.  Constitutional:      General: He is not in acute distress.    Appearance: Normal appearance. He is overweight. He is not ill-appearing or diaphoretic.  HENT:     Head: Normocephalic.  Eyes:     General: No scleral icterus.       Right eye: No discharge.        Left eye: No discharge.     Conjunctiva/sclera: Conjunctivae normal.     Pupils: Pupils are equal, round, and reactive to light.  Cardiovascular:     Rate and Rhythm: Normal rate and regular rhythm.     Pulses: Normal pulses.     Heart sounds: Normal heart sounds. No murmur heard.    No friction  rub. No gallop.  Pulmonary:     Effort: Pulmonary effort is normal. No respiratory distress.     Breath sounds: Normal breath sounds. No wheezing, rhonchi or rales.  Chest:     Chest wall: No tenderness.  Abdominal:     General: Bowel sounds are normal. There is no distension.     Palpations: Abdomen is soft. There is no mass.     Tenderness: There is no abdominal tenderness. There is no right CVA tenderness, left CVA tenderness, guarding or rebound.     Hernia: A hernia is present. Hernia is present in the left inguinal area.     Comments: Reducible inguinal hernia without any strangulation   Musculoskeletal:        General: No swelling or tenderness. Normal range of motion.     Right lower leg:  No edema.     Left lower leg: No edema.  Skin:    General: Skin is warm and dry.     Coloration: Skin is not pale.     Findings: No bruising, erythema, lesion or rash.  Neurological:     Mental Status: He is alert and oriented to person, place, and time.     Gait: Gait normal.  Psychiatric:        Mood and Affect: Mood normal.        Speech: Speech normal.        Behavior: Behavior normal.    Labs reviewed: No results for input(s): "NA", "K", "CL", "CO2", "GLUCOSE", "BUN", "CREATININE", "CALCIUM", "MG", "PHOS" in the last 8760 hours. No results for input(s): "AST", "ALT", "ALKPHOS", "BILITOT", "PROT", "ALBUMIN" in the last 8760 hours. No results for input(s): "WBC", "NEUTROABS", "HGB", "HCT", "MCV", "PLT" in the last 8760 hours. Lab Results  Component Value Date   TSH 0.432 09/02/2021   Lab Results  Component Value Date   HGBA1C 5.0 09/16/2021   Lab Results  Component Value Date   CHOL 182 09/16/2021   HDL 48 09/16/2021   LDLCALC 118 09/16/2021   TRIG 81 09/16/2021    Significant Diagnostic Results in last 30 days:  No results found.  Assessment/Plan  Inguinal hernia of left side without obstruction or gangrene Left inguinal hernia reducible without any  strangulation. Follow up with general surgery. - Additional Educational information provided on AVS  Family/ staff Communication: Reviewed plan of care with patient and POA verbalized understanding   Labs/tests ordered: None   Next Appointment: Return in about 3 months (around 02/27/2023) for medical mangement of chronic issues with Dr.Miller .   Sandrea Hughs, NP

## 2022-12-11 DIAGNOSIS — F331 Major depressive disorder, recurrent, moderate: Secondary | ICD-10-CM | POA: Diagnosis not present

## 2023-01-02 DIAGNOSIS — F410 Panic disorder [episodic paroxysmal anxiety] without agoraphobia: Secondary | ICD-10-CM | POA: Diagnosis not present

## 2023-01-02 DIAGNOSIS — F331 Major depressive disorder, recurrent, moderate: Secondary | ICD-10-CM | POA: Diagnosis not present

## 2023-01-15 DIAGNOSIS — F410 Panic disorder [episodic paroxysmal anxiety] without agoraphobia: Secondary | ICD-10-CM | POA: Diagnosis not present

## 2023-01-15 DIAGNOSIS — F331 Major depressive disorder, recurrent, moderate: Secondary | ICD-10-CM | POA: Diagnosis not present

## 2023-01-28 DIAGNOSIS — F331 Major depressive disorder, recurrent, moderate: Secondary | ICD-10-CM | POA: Diagnosis not present

## 2023-01-28 DIAGNOSIS — F410 Panic disorder [episodic paroxysmal anxiety] without agoraphobia: Secondary | ICD-10-CM | POA: Diagnosis not present

## 2023-02-25 ENCOUNTER — Ambulatory Visit: Payer: Medicare Other | Admitting: Family Medicine

## 2023-03-02 DIAGNOSIS — F331 Major depressive disorder, recurrent, moderate: Secondary | ICD-10-CM | POA: Diagnosis not present

## 2023-03-13 DIAGNOSIS — F410 Panic disorder [episodic paroxysmal anxiety] without agoraphobia: Secondary | ICD-10-CM | POA: Diagnosis not present

## 2023-03-13 DIAGNOSIS — F331 Major depressive disorder, recurrent, moderate: Secondary | ICD-10-CM | POA: Diagnosis not present

## 2023-03-18 ENCOUNTER — Encounter: Payer: Self-pay | Admitting: Family Medicine

## 2023-03-18 ENCOUNTER — Ambulatory Visit (INDEPENDENT_AMBULATORY_CARE_PROVIDER_SITE_OTHER): Payer: Medicare Other | Admitting: Family Medicine

## 2023-03-18 VITALS — BP 140/88 | HR 66 | Temp 97.6°F | Ht 69.0 in | Wt 183.0 lb

## 2023-03-18 DIAGNOSIS — G8929 Other chronic pain: Secondary | ICD-10-CM | POA: Diagnosis not present

## 2023-03-18 DIAGNOSIS — I700 Atherosclerosis of aorta: Secondary | ICD-10-CM | POA: Diagnosis not present

## 2023-03-18 DIAGNOSIS — K922 Gastrointestinal hemorrhage, unspecified: Secondary | ICD-10-CM

## 2023-03-18 DIAGNOSIS — M5441 Lumbago with sciatica, right side: Secondary | ICD-10-CM

## 2023-03-18 NOTE — Progress Notes (Signed)
Provider:  Jacalyn Lefevre, MD  Careteam: Patient Care Team: Frederica Kuster, MD as PCP - General (Family Medicine) Kathi Der, MD as Consulting Physician (Gastroenterology)  PLACE OF SERVICE:  [facility]  Advanced Directive information    Allergies  Allergen Reactions   Codeine Nausea Only    Chief Complaint  Patient presents with   Medical Management of Chronic Issues    Patient presents today for a 3 month follow-up   Quality Metric Gaps    AWV, colonoscopy, Hep C screening, pneumonia, COVID#4     HPI: Patient is a 77 y.o. male this is a 2-month follow-up for routine care of chronic problems including atherosclerosis of aorta history of GI bleed and resultant encephalopathy.  Prior to his first visit here he had been hospitalized and was rehabbed at [place] by [employer] and then gradually made his way to this office.  Current medications include Wellbutrin and Zoloft for depression.  He also continues on pantoprazole for GI acid disease.  He has had no further bleeding or pain. In reviewing labs from several years ago LDL was elevated at 118 He voices some concerns about his memory such as people's names but briefly I ask him the 3 word at 5-minute test as well as what he ate for supper last night and he did well answering both of those questions appropriately without hesitation.  Review of Systems:  ROS  Past Medical History:  Diagnosis Date   Arthritis    Depression    Past Surgical History:  Procedure Laterality Date   BACK SURGERY     BIOPSY  08/12/2019   Procedure: BIOPSY;  Surgeon: Kathi Der, MD;  [facility];  Service: Gastroenterology;;   BIOPSY  09/04/2021   Procedure: BIOPSY;  Surgeon: Kathi Der, MD;  [facility];  Service: Gastroenterology;;   CATARACT EXTRACTION     Per [facility] New Patient Packet   COLONOSCOPY     Per [facility] New Patient Packet   ESOPHAGOGASTRODUODENOSCOPY N/A  09/04/2021   Procedure: ESOPHAGOGASTRODUODENOSCOPY (EGD);  Surgeon: Kathi Der, MD;  Location: [facility];  Service: Gastroenterology;  Laterality: N/A;   ESOPHAGOGASTRODUODENOSCOPY (EGD) WITH PROPOFOL N/A 08/12/2019   Procedure: ESOPHAGOGASTRODUODENOSCOPY (EGD) WITH PROPOFOL;  Surgeon: Kathi Der, MD;  [facility];  Service: Gastroenterology;  Laterality: N/A;   EYE SURGERY Bilateral    cataracts   KNEE ARTHROSCOPY Left    LUMBAR LAMINECTOMY/DECOMPRESSION MICRODISCECTOMY N/A 06/27/2019   Procedure: L3-4 HEMILAMINECTOMY DISCECTOMY;  Surgeon: Lucy Chris, MD;  Location: [facility];  Service: Neurosurgery;  Laterality: N/A;   UPPER GI ENDOSCOPY  2021   Per [facility] New Patient Packet, Dr.Brahmbatt   Social History:   reports that he quit smoking about 18 years ago. His smoking use included cigarettes. He has a 15.00 pack-year smoking history. He has never used smokeless tobacco. He reports that he does not currently use alcohol. He reports that he does not use drugs.  Family History  Problem Relation Age of Onset   Stroke Mother    Pneumonia Mother    Arthritis Mother    Cancer Father    Stroke Maternal Grandmother    Cancer Maternal Grandfather     Medications: Patient's Medications  New Prescriptions   No medications on file  Previous Medications   ACETAMINOPHEN (TYLENOL) 500 MG TABLET    Take 500 mg by mouth as needed (pain).   BUPROPION (WELLBUTRIN XL) 150 MG 24 HR TABLET    Take 150  mg by mouth daily.   GABAPENTIN (NEURONTIN) 300 MG CAPSULE    TAKE ONE CAPSULE BY MOUTH THREE TIMES A DAY   MULTIPLE VITAMIN (MULTIVITAMIN WITH MINERALS) TABS TABLET    Take 1 tablet by mouth daily.   PANTOPRAZOLE (PROTONIX) 40 MG TABLET    Take 1 tablet (40 mg total) by mouth daily.   SERTRALINE (ZOLOFT) 100 MG TABLET    Take 1 tablet (100 mg total) by mouth at bedtime.  Modified Medications   No medications on file  Discontinued Medications   No medications on file     Physical Exam:  Vitals:   03/18/23 1453 03/18/23 1526  BP: (!) 152/96 (!) 140/88  Pulse: 66   Temp: 97.6 F (36.4 C)   SpO2: 96%   Weight: 183 lb (83 kg)   Height: 5\' 9"  (1.753 m)    Body mass index is 27.02 kg/m. Wt Readings from Last 3 Encounters:  03/18/23 183 lb (83 kg)  11/28/22 178 lb 6.4 oz (80.9 kg)  02/18/22 181 lb 6.4 oz (82.3 kg)    Physical Exam Vitals and nursing note reviewed.  Constitutional:      Appearance: Normal appearance.  Cardiovascular:     Rate and Rhythm: Normal rate and regular rhythm.     Heart sounds: Normal heart sounds.  Pulmonary:     Effort: Pulmonary effort is normal.     Breath sounds: Normal breath sounds.  Abdominal:     General: Bowel sounds are normal.     Palpations: Abdomen is soft.  Neurological:     Mental Status: He is alert and oriented to person, place, and time.  Psychiatric:        Mood and Affect: Mood normal.        Behavior: Behavior normal.     Labs reviewed: Basic Metabolic Panel: No results for input(s): "NA", "K", "CL", "CO2", "GLUCOSE", "BUN", "CREATININE", "CALCIUM", "MG", "PHOS", "TSH" in the last 8760 hours. Liver Function Tests: No results for input(s): "AST", "ALT", "ALKPHOS", "BILITOT", "PROT", "ALBUMIN" in the last 8760 hours. No results for input(s): "LIPASE", "AMYLASE" in the last 8760 hours. No results for input(s): "AMMONIA" in the last 8760 hours. CBC: No results for input(s): "WBC", "NEUTROABS", "HGB", "HCT", "MCV", "PLT" in the last 8760 hours. Lipid Panel: No results for input(s): "CHOL", "HDL", "LDLCALC", "TRIG", "CHOLHDL", "LDLDIRECT" in the last 8760 hours. TSH: No results for input(s): "TSH" in the last 8760 hours. A1C: Lab Results  Component Value Date   HGBA1C 5.0 09/16/2021     Assessment/Plan 1. Aortic atherosclerosis (HCC) Reassess lipids today blood pressure was repeated and second reading was 140/88.  If lipids are still elevated consider statin  2. Acute upper GI  bleed Continues with PPI.  Asymptomatic  3. Chronic low back pain with right-sided sciatica, unspecified back pain laterality Back pain has resolved by history.  He formally took gabapentin for the pain but that has been discontinued    Jacalyn Lefevre, MD [facility] [phone]

## 2023-03-19 LAB — LIPID PANEL
Cholesterol: 194 mg/dL (ref <200)
HDL: 50 mg/dL (ref >=40)
LDL Cholesterol (Calc): 119 mg/dL (calc) — ABNORMAL HIGH
Non-HDL Cholesterol (Calc): 144 mg/dL (calc) — ABNORMAL HIGH (ref <130)
Total CHOL/HDL Ratio: 3.9 (calc) (ref <5.0)
Triglycerides: 131 mg/dL (ref <150)

## 2023-03-19 LAB — COMPREHENSIVE METABOLIC PANEL
AG Ratio: 2.1 (calc) (ref 1.0–2.5)
ALT: 14 U/L (ref 9–46)
AST: 14 U/L (ref 10–35)
Albumin: 4.4 g/dL (ref 3.6–5.1)
Alkaline phosphatase (APISO): 58 U/L (ref 35–144)
BUN/Creatinine Ratio: 23 (calc) — ABNORMAL HIGH (ref 6–22)
BUN: 26 mg/dL — ABNORMAL HIGH (ref 7–25)
CO2: 25 mmol/L (ref 20–32)
Calcium: 9.5 mg/dL (ref 8.6–10.3)
Chloride: 109 mmol/L (ref 98–110)
Creat: 1.15 mg/dL (ref 0.70–1.28)
Globulin: 2.1 g/dL (calc) (ref 1.9–3.7)
Glucose, Bld: 101 mg/dL — ABNORMAL HIGH (ref 65–99)
Potassium: 3.9 mmol/L (ref 3.5–5.3)
Sodium: 143 mmol/L (ref 135–146)
Total Bilirubin: 0.4 mg/dL (ref 0.2–1.2)
Total Protein: 6.5 g/dL (ref 6.1–8.1)

## 2023-04-02 DIAGNOSIS — F410 Panic disorder [episodic paroxysmal anxiety] without agoraphobia: Secondary | ICD-10-CM | POA: Diagnosis not present

## 2023-04-02 DIAGNOSIS — F331 Major depressive disorder, recurrent, moderate: Secondary | ICD-10-CM | POA: Diagnosis not present

## 2023-04-16 DIAGNOSIS — F410 Panic disorder [episodic paroxysmal anxiety] without agoraphobia: Secondary | ICD-10-CM | POA: Diagnosis not present

## 2023-04-16 DIAGNOSIS — F331 Major depressive disorder, recurrent, moderate: Secondary | ICD-10-CM | POA: Diagnosis not present

## 2023-04-30 DIAGNOSIS — F331 Major depressive disorder, recurrent, moderate: Secondary | ICD-10-CM | POA: Diagnosis not present

## 2023-05-14 DIAGNOSIS — F331 Major depressive disorder, recurrent, moderate: Secondary | ICD-10-CM | POA: Diagnosis not present

## 2023-05-29 DIAGNOSIS — F410 Panic disorder [episodic paroxysmal anxiety] without agoraphobia: Secondary | ICD-10-CM | POA: Diagnosis not present

## 2023-05-29 DIAGNOSIS — F331 Major depressive disorder, recurrent, moderate: Secondary | ICD-10-CM | POA: Diagnosis not present

## 2023-07-02 DIAGNOSIS — F331 Major depressive disorder, recurrent, moderate: Secondary | ICD-10-CM | POA: Diagnosis not present

## 2023-07-02 DIAGNOSIS — F410 Panic disorder [episodic paroxysmal anxiety] without agoraphobia: Secondary | ICD-10-CM | POA: Diagnosis not present

## 2023-07-22 ENCOUNTER — Encounter: Payer: Self-pay | Admitting: Sports Medicine

## 2023-07-22 ENCOUNTER — Ambulatory Visit (INDEPENDENT_AMBULATORY_CARE_PROVIDER_SITE_OTHER): Payer: Medicare Other | Admitting: Sports Medicine

## 2023-07-22 VITALS — BP 124/68 | HR 68 | Temp 94.8°F | Resp 18 | Ht 69.0 in | Wt 172.0 lb

## 2023-07-22 DIAGNOSIS — M545 Low back pain, unspecified: Secondary | ICD-10-CM | POA: Diagnosis not present

## 2023-07-22 DIAGNOSIS — K209 Esophagitis, unspecified without bleeding: Secondary | ICD-10-CM | POA: Diagnosis not present

## 2023-07-22 DIAGNOSIS — R051 Acute cough: Secondary | ICD-10-CM | POA: Diagnosis not present

## 2023-07-22 DIAGNOSIS — F320 Major depressive disorder, single episode, mild: Secondary | ICD-10-CM | POA: Diagnosis not present

## 2023-07-22 DIAGNOSIS — R2689 Other abnormalities of gait and mobility: Secondary | ICD-10-CM

## 2023-07-22 DIAGNOSIS — G8929 Other chronic pain: Secondary | ICD-10-CM

## 2023-07-22 DIAGNOSIS — Z23 Encounter for immunization: Secondary | ICD-10-CM

## 2023-07-22 DIAGNOSIS — I700 Atherosclerosis of aorta: Secondary | ICD-10-CM

## 2023-07-22 MED ORDER — ATORVASTATIN CALCIUM 40 MG PO TABS
40.0000 mg | ORAL_TABLET | Freq: Every day | ORAL | 3 refills | Status: DC
Start: 2023-07-22 — End: 2024-09-12

## 2023-07-22 MED ORDER — BENZONATATE 200 MG PO CAPS: 200.0000 mg | ORAL_CAPSULE | Freq: Two times a day (BID) | ORAL | 0 refills | Status: DC | PRN

## 2023-07-22 NOTE — Progress Notes (Unsigned)
Careteam: Patient Care Team: Venita Sheffield, MD as PCP - General (Internal Medicine) Kathi Der, MD as Consulting Physician (Gastroenterology)  PLACE OF SERVICE:  [facility]  Advanced Directive information Does Patient Have a Medical Advance Directive?: Yes, Type of Advance Directive: Healthcare Power of [place];Living will;Out of facility DNR (pink MOST or yellow form), Does patient want to make changes to medical advance directive?: No - Patient declined  Allergies  Allergen Reactions   Codeine Nausea Only    Chief Complaint  Patient presents with   Medical Management of Chronic Issues    Patient is being seen for for 4 month follow up. Patient states he has noticed he has developed some bronchitis      HPI: Patient is a 77 y.o. male is here for follow up   Cough  x 4 weeks  Productive cough , yellowish  States it got better Denies fevers Reports good appetite Denies runny nose , sinus congestion   Live alone Independent with his ADLS and IADLS No more driving  Has friends who he can call if he needs any  Forgetful ,    Depression  Doing better  He had a rough time during spring but doing better now On wellbutrin and zoloft    H/o Gastritis  Denies acid reflux EGD - oesophagitis  On protonix Denies dark or bloody stools  Denies feeling dizzy or lightheaded  Low back pain  Mild  Fallen few months ago States his rt leg gives out some times Just started exercises recently  Ambulates independently but uses walker when he needs to walk for long distances Tries physical therapy in the past and interested in doing home exercises       The 10-year ASCVD risk score (Arnett DK, et al., 2019) is: 24.8%   Values used to calculate the score:     Age: 70 years     Sex: Male     Is Non-Hispanic African American: No     Diabetic: No     Tobacco smoker: No     Systolic Blood Pressure: 124 mmHg     Is BP treated: No     HDL Cholesterol: 50  mg/dL     Total Cholesterol: 194 mg/dL   Review of Systems:  Review of Systems  Constitutional:  Negative for chills and fever.  HENT:  Negative for congestion and sore throat.   Eyes:  Negative for double vision.  Respiratory:  Positive for cough. Negative for sputum production and shortness of breath.   Cardiovascular:  Negative for chest pain, palpitations and leg swelling.  Gastrointestinal:  Negative for abdominal pain, heartburn and nausea.  Genitourinary:  Negative for dysuria, frequency and hematuria.  Musculoskeletal:  Positive for back pain. Negative for falls and myalgias.  Neurological:  Negative for dizziness, sensory change and focal weakness.  Psychiatric/Behavioral:  Positive for memory loss.     Past Medical History:  Diagnosis Date   Arthritis    Depression    Past Surgical History:  Procedure Laterality Date   BACK SURGERY     BIOPSY  08/12/2019   Procedure: BIOPSY;  Surgeon: Kathi Der, MD;  [facility];  Service: Gastroenterology;;   BIOPSY  09/04/2021   Procedure: BIOPSY;  Surgeon: Kathi Der, MD;  [facility];  Service: Gastroenterology;;   CATARACT EXTRACTION     Per [facility] New Patient Packet   COLONOSCOPY     Per [facility] New Patient Packet   ESOPHAGOGASTRODUODENOSCOPY N/A 09/04/2021  Procedure: ESOPHAGOGASTRODUODENOSCOPY (EGD);  Surgeon: Kathi Der, MD;  Location: [facility];  Service: Gastroenterology;  Laterality: N/A;   ESOPHAGOGASTRODUODENOSCOPY (EGD) WITH PROPOFOL N/A 08/12/2019   Procedure: ESOPHAGOGASTRODUODENOSCOPY (EGD) WITH PROPOFOL;  Surgeon: Kathi Der, MD;  [facility];  Service: Gastroenterology;  Laterality: N/A;   EYE SURGERY Bilateral    cataracts   KNEE ARTHROSCOPY Left    LUMBAR LAMINECTOMY/DECOMPRESSION MICRODISCECTOMY N/A 06/27/2019   Procedure: L3-4 HEMILAMINECTOMY DISCECTOMY;  Surgeon: Lucy Chris, MD;  Location: [facility];  Service: Neurosurgery;  Laterality: N/A;    UPPER GI ENDOSCOPY  2021   Per [facility] New Patient Packet, Dr.Brahmbatt   Social History:   reports that he quit smoking about 19 years ago. His smoking use included cigarettes. He started smoking about 34 years ago. He has a 15 pack-year smoking history. He has never used smokeless tobacco. He reports that he does not currently use alcohol. He reports that he does not use drugs.  Family History  Problem Relation Age of Onset   Stroke Mother    Pneumonia Mother    Arthritis Mother    Cancer Father    Stroke Maternal Grandmother    Cancer Maternal Grandfather     Medications: Patient's Medications  New Prescriptions   No medications on file  Previous Medications   ACETAMINOPHEN (TYLENOL) 500 MG TABLET    Take 500 mg by mouth as needed (pain).   BUPROPION (WELLBUTRIN XL) 150 MG 24 HR TABLET    Take 150 mg by mouth daily.   GABAPENTIN (NEURONTIN) 300 MG CAPSULE    TAKE ONE CAPSULE BY MOUTH THREE TIMES A DAY   MULTIPLE VITAMIN (MULTIVITAMIN WITH MINERALS) TABS TABLET    Take 1 tablet by mouth daily.   PANTOPRAZOLE (PROTONIX) 40 MG TABLET    Take 1 tablet (40 mg total) by mouth daily.   SERTRALINE (ZOLOFT) 100 MG TABLET    Take 1 tablet (100 mg total) by mouth at bedtime.  Modified Medications   No medications on file  Discontinued Medications   No medications on file    Physical Exam:  Vitals:   07/22/23 1458  BP: 124/68  Pulse: 68  Resp: 18  Temp: (!) 94.8 F (34.9 C)  TempSrc: Temporal  SpO2: 98%  Weight: 172 lb (78 kg)  Height: 5\' 9"  (1.753 m)   Body mass index is 25.4 kg/m. Wt Readings from Last 3 Encounters:  07/22/23 172 lb (78 kg)  03/18/23 183 lb (83 kg)  11/28/22 178 lb 6.4 oz (80.9 kg)    Physical Exam Constitutional:      Appearance: Normal appearance.  HENT:     Head: Normocephalic and atraumatic.  Cardiovascular:     Rate and Rhythm: Normal rate and regular rhythm.     Pulses: Normal pulses.     Heart sounds: Normal heart sounds.  Pulmonary:      Effort: No respiratory distress.     Breath sounds: No stridor. No wheezing or rales.  Abdominal:     General: Bowel sounds are normal. There is no distension.     Palpations: Abdomen is soft.     Tenderness: There is no abdominal tenderness. There is no right CVA tenderness or guarding.  Musculoskeletal:        General: No swelling.  Neurological:     Mental Status: He is alert. Mental status is at baseline.     Sensory: No sensory deficit.     Motor: No weakness.     Labs reviewed:  Basic Metabolic Panel: Recent Labs    03/18/23 1547  NA 143  K 3.9  CL 109  CO2 25  GLUCOSE 101*  BUN 26*  CREATININE 1.15  CALCIUM 9.5   Liver Function Tests: Recent Labs    03/18/23 1547  AST 14  ALT 14  BILITOT 0.4  PROT 6.5   No results for input(s): "LIPASE", "AMYLASE" in the last 8760 hours. No results for input(s): "AMMONIA" in the last 8760 hours. CBC: No results for input(s): "WBC", "NEUTROABS", "HGB", "HCT", "MCV", "PLT" in the last 8760 hours. Lipid Panel: Recent Labs    03/18/23 1547  CHOL 194  HDL 50  LDLCALC 119*  TRIG 131  CHOLHDL 3.9   TSH: No results for input(s): "TSH" in the last 8760 hours. A1C: Lab Results  Component Value Date   HGBA1C 5.0 09/16/2021     Assessment/Plan  1. Esophagitis Doing ok  Cont with PPI   2. Aortic atherosclerosis (HCC) Ascvd score 24% Informed patient  about his Ascvd score 24% being high  Will start lipitor  3. Chronic bilateral low back pain without sciatica No red flag signs Cont with tylenol prn for pain   4. Current mild episode of major depressive disorder without prior episode (HCC) Stable  Cont with zoloft   5. Balance disorder Instructed patient to do home exercises, he completed PT recently   6. Acute cough Lungs clear  Sent tessalon to his pharmacy   7. Need for influenza vaccination  - Flu Vaccine Trivalent High Dose (Fluad)  Other orders - atorvastatin (LIPITOR) 40 MG tablet; Take 1 tablet  (40 mg total) by mouth daily.  Dispense: 90 tablet; Refill: 3 - benzonatate (TESSALON) 200 MG capsule; Take 1 capsule (200 mg total) by mouth 2 (two) times daily as needed for cough.  Dispense: 20 capsule; Refill: 0   No follow-ups on file.:  3months

## 2023-07-27 DIAGNOSIS — F331 Major depressive disorder, recurrent, moderate: Secondary | ICD-10-CM | POA: Diagnosis not present

## 2023-08-11 DIAGNOSIS — F331 Major depressive disorder, recurrent, moderate: Secondary | ICD-10-CM | POA: Diagnosis not present

## 2023-09-15 DIAGNOSIS — F331 Major depressive disorder, recurrent, moderate: Secondary | ICD-10-CM | POA: Diagnosis not present

## 2023-10-05 DIAGNOSIS — F331 Major depressive disorder, recurrent, moderate: Secondary | ICD-10-CM | POA: Diagnosis not present

## 2023-10-09 DIAGNOSIS — F331 Major depressive disorder, recurrent, moderate: Secondary | ICD-10-CM | POA: Diagnosis not present

## 2023-10-20 ENCOUNTER — Encounter: Payer: Self-pay | Admitting: Sports Medicine

## 2023-10-20 ENCOUNTER — Ambulatory Visit (INDEPENDENT_AMBULATORY_CARE_PROVIDER_SITE_OTHER): Payer: Medicare Other | Admitting: Sports Medicine

## 2023-10-20 VITALS — BP 124/70 | HR 68 | Temp 96.8°F | Resp 16 | Ht 69.0 in | Wt 177.4 lb

## 2023-10-20 DIAGNOSIS — Z113 Encounter for screening for infections with a predominantly sexual mode of transmission: Secondary | ICD-10-CM

## 2023-10-20 DIAGNOSIS — F320 Major depressive disorder, single episode, mild: Secondary | ICD-10-CM | POA: Diagnosis not present

## 2023-10-20 DIAGNOSIS — K269 Duodenal ulcer, unspecified as acute or chronic, without hemorrhage or perforation: Secondary | ICD-10-CM | POA: Insufficient documentation

## 2023-10-20 DIAGNOSIS — K222 Esophageal obstruction: Secondary | ICD-10-CM | POA: Insufficient documentation

## 2023-10-20 DIAGNOSIS — E782 Mixed hyperlipidemia: Secondary | ICD-10-CM | POA: Diagnosis not present

## 2023-10-20 DIAGNOSIS — K2950 Unspecified chronic gastritis without bleeding: Secondary | ICD-10-CM | POA: Insufficient documentation

## 2023-10-20 DIAGNOSIS — K219 Gastro-esophageal reflux disease without esophagitis: Secondary | ICD-10-CM

## 2023-10-20 DIAGNOSIS — R2689 Other abnormalities of gait and mobility: Secondary | ICD-10-CM

## 2023-10-20 DIAGNOSIS — K4090 Unilateral inguinal hernia, without obstruction or gangrene, not specified as recurrent: Secondary | ICD-10-CM

## 2023-10-20 DIAGNOSIS — H9190 Unspecified hearing loss, unspecified ear: Secondary | ICD-10-CM | POA: Insufficient documentation

## 2023-10-20 DIAGNOSIS — K209 Esophagitis, unspecified without bleeding: Secondary | ICD-10-CM | POA: Diagnosis not present

## 2023-10-20 DIAGNOSIS — K2210 Ulcer of esophagus without bleeding: Secondary | ICD-10-CM | POA: Insufficient documentation

## 2023-10-20 NOTE — Progress Notes (Signed)
Careteam: Patient Care Team: Venita Sheffield, MD as PCP - General (Internal Medicine) Kathi Der, MD as Consulting Physician (Gastroenterology)  PLACE OF SERVICE:  [facility]  Advanced Directive information Does Patient Have a Medical Advance Directive?: Yes, Type of Advance Directive: Healthcare Power of [place];Living will, Does patient want to make changes to medical advance directive?: No - Patient declined  Allergies  Allergen Reactions   Codeine Nausea Only    Chief Complaint  Patient presents with   Medical Management of Chronic Issues    3 month follow up.    Immunizations    Discuss the need for [employer].    Health Maintenance    Discuss the need for Hepatitis C Screening, and AWV.      HPI: Patient is a 77 y.o. male is here for follow up   Left inguinal hernia C/o intermittent pain  Denies abdominal pain, nausea, vomiting States he is not planning to go for surgery    Depression  Follows with psychiatry  On Wellbutrin and zoloft  Denies having suicidal ideation  H/o GERD  Denies bloody or dark stools On protonix  Balance problems  Reports h/o back surgery yrs ago  Reports some problems with his balance Ambulates with a cane Denies pain in his lower back Had couple of falls within last yr  Uses rolator walker some times Does not want to try physical therapy exercises    Review of Systems:  Review of Systems  Constitutional:  Negative for chills and fever.  HENT:  Negative for congestion and sore throat.   Eyes:  Negative for double vision.  Respiratory:  Negative for cough, sputum production and shortness of breath.   Cardiovascular:  Negative for chest pain, palpitations and leg swelling.  Gastrointestinal:  Negative for abdominal pain, heartburn and nausea.  Genitourinary:  Negative for dysuria, frequency and hematuria.  Musculoskeletal:  Negative for falls and myalgias.  Neurological:  Negative for dizziness, sensory  change and focal weakness.   Negative unless indicated in HPI.   Past Medical History:  Diagnosis Date   Arthritis    Depression    Past Surgical History:  Procedure Laterality Date   BACK SURGERY     BIOPSY  08/12/2019   Procedure: BIOPSY;  Surgeon: Kathi Der, MD;  Location: [facility];  Service: Gastroenterology;;   BIOPSY  09/04/2021   Procedure: BIOPSY;  Surgeon: Kathi Der, MD;  Location: [facility];  Service: Gastroenterology;;   CATARACT EXTRACTION     Per [facility] New Patient Packet   COLONOSCOPY     Per [facility] New Patient Packet   ESOPHAGOGASTRODUODENOSCOPY N/A 09/04/2021   Procedure: ESOPHAGOGASTRODUODENOSCOPY (EGD);  Surgeon: Kathi Der, MD;  Location: [facility];  Service: Gastroenterology;  Laterality: N/A;   ESOPHAGOGASTRODUODENOSCOPY (EGD) WITH PROPOFOL N/A 08/12/2019   Procedure: ESOPHAGOGASTRODUODENOSCOPY (EGD) WITH PROPOFOL;  Surgeon: Kathi Der, MD;  Location: [facility];  Service: Gastroenterology;  Laterality: N/A;   EYE SURGERY Bilateral    cataracts   KNEE ARTHROSCOPY Left    LUMBAR LAMINECTOMY/DECOMPRESSION MICRODISCECTOMY N/A 06/27/2019   Procedure: L3-4 HEMILAMINECTOMY DISCECTOMY;  Surgeon: Lucy Chris, MD;  Location: [facility] ORS;  Service: Neurosurgery;  Laterality: N/A;   UPPER GI ENDOSCOPY  2021   Per [facility] New Patient Packet, Dr.Brahmbatt   Social History:   reports that he quit smoking about 19 years ago. His smoking use included cigarettes. He started smoking about 34 years ago. He has a 15 pack-year smoking history. He has never used smokeless tobacco. He  reports current alcohol use. He reports that he does not use drugs.  Family History  Problem Relation Age of Onset   Stroke Mother    Pneumonia Mother    Arthritis Mother    Cancer Father    Stroke Maternal Grandmother    Cancer Maternal Grandfather     Medications: Patient's Medications  New Prescriptions   No medications on file  Previous Medications    ACETAMINOPHEN (TYLENOL) 500 MG TABLET    Take 500 mg by mouth as needed (pain).   ATORVASTATIN (LIPITOR) 40 MG TABLET    Take 1 tablet (40 mg total) by mouth daily.   BUPROPION (WELLBUTRIN XL) 150 MG 24 HR TABLET    Take 150 mg by mouth daily.   MULTIPLE VITAMIN (MULTIVITAMIN WITH MINERALS) TABS TABLET    Take 1 tablet by mouth daily.   PANTOPRAZOLE (PROTONIX) 40 MG TABLET    Take 1 tablet (40 mg total) by mouth daily.   SERTRALINE (ZOLOFT) 100 MG TABLET    Take 200 mg by mouth at bedtime.  Modified Medications   No medications on file  Discontinued Medications   BENZONATATE (TESSALON) 200 MG CAPSULE    Take 1 capsule (200 mg total) by mouth 2 (two) times daily as needed for cough.   GABAPENTIN (NEURONTIN) 300 MG CAPSULE    TAKE ONE CAPSULE BY MOUTH THREE TIMES A DAY   SERTRALINE (ZOLOFT) 100 MG TABLET    Take 1 tablet (100 mg total) by mouth at bedtime.    Physical Exam: Vitals:   10/20/23 1325  BP: 124/70  Pulse: 68  Resp: 16  Temp: (!) 96.8 F (36 C)  SpO2: 96%  Weight: 177 lb 6.4 oz (80.5 kg)  Height: 5\' 9"  (1.753 m)   Body mass index is 26.2 kg/m. BP Readings from Last 3 Encounters:  10/20/23 124/70  07/22/23 124/68  03/18/23 (!) 140/88   Wt Readings from Last 3 Encounters:  10/20/23 177 lb 6.4 oz (80.5 kg)  07/22/23 172 lb (78 kg)  03/18/23 183 lb (83 kg)    Physical Exam Constitutional:      Appearance: Normal appearance.  HENT:     Head: Normocephalic and atraumatic.  Cardiovascular:     Rate and Rhythm: Normal rate and regular rhythm.     Pulses: Normal pulses.     Heart sounds: Normal heart sounds.  Pulmonary:     Effort: No respiratory distress.     Breath sounds: No stridor. No wheezing or rales.  Abdominal:     General: Bowel sounds are normal. There is no distension.     Palpations: Abdomen is soft.     Tenderness: There is no abdominal tenderness. There is no right CVA tenderness or guarding.  Musculoskeletal:        General: No swelling.   Neurological:     Mental Status: He is alert. Mental status is at baseline.     Sensory: No sensory deficit.     Motor: No weakness.     Labs reviewed: Basic Metabolic Panel: Recent Labs    03/18/23 1547  NA 143  K 3.9  CL 109  CO2 25  GLUCOSE 101*  BUN 26*  CREATININE 1.15  CALCIUM 9.5   Liver Function Tests: Recent Labs    03/18/23 1547  AST 14  ALT 14  BILITOT 0.4  PROT 6.5   No results for input(s): "LIPASE", "AMYLASE" in the last 8760 hours. No results for input(s): "AMMONIA" in the last  8760 hours. CBC: No results for input(s): "WBC", "NEUTROABS", "HGB", "HCT", "MCV", "PLT" in the last 8760 hours. Lipid Panel: Recent Labs    03/18/23 1547  CHOL 194  HDL 50  LDLCALC 119*  TRIG 131  CHOLHDL 3.9   TSH: No results for input(s): "TSH" in the last 8760 hours. A1C: Lab Results  Component Value Date   HGBA1C 5.0 09/16/2021     Assessment/Plan     Current mild episode of major depressive disorder without prior episode (HCC)  Denies having active suicidal ideations Cont with zoloft and wellbutrin - Complete Metabolic Panel with eGFR    Balance disorder  Pt declined to try physical therapy  Instructed to do home exercises Increase physical activity    Gastroesophageal reflux disease, unspecified whether esophagitis present  Denies heart burn , acid reflux Cont with protonix    Inguinal hernia of left side without obstruction or gangrene Denies abdominal pain  Informed patient about the warning signs including abdominal pain, nausea, vomiting      Mixed hyperlipidemia  Cont with lipitor - Complete Metabolic Panel with eGFR    Screening for STD (sexually transmitted disease)   - Hepatitis C antibody  Other orders - sertraline (ZOLOFT) 100 MG tablet; Take 200 mg by mouth at bedtime.   Return in about 4 months (around 02/18/2024), or schedule for MWV.:   40 Total time spent for obtaining history,  performing a medically appropriate  examination and evaluation, reviewing the tests,ordering  tests,  documenting clinical information in the electronic or other health record, independently interpreting results ,care coordination (not separately reported)

## 2023-10-21 LAB — COMPLETE METABOLIC PANEL WITH GFR
AG Ratio: 2 (calc) (ref 1.0–2.5)
ALT: 23 U/L (ref 9–46)
AST: 20 U/L (ref 10–35)
Albumin: 4.3 g/dL (ref 3.6–5.1)
Alkaline phosphatase (APISO): 77 U/L (ref 35–144)
BUN: 18 mg/dL (ref 7–25)
CO2: 29 mmol/L (ref 20–32)
Calcium: 9.7 mg/dL (ref 8.6–10.3)
Chloride: 107 mmol/L (ref 98–110)
Creat: 1.07 mg/dL (ref 0.70–1.28)
Globulin: 2.2 g/dL (ref 1.9–3.7)
Glucose, Bld: 100 mg/dL — ABNORMAL HIGH (ref 65–99)
Potassium: 4.2 mmol/L (ref 3.5–5.3)
Sodium: 141 mmol/L (ref 135–146)
Total Bilirubin: 0.4 mg/dL (ref 0.2–1.2)
Total Protein: 6.5 g/dL (ref 6.1–8.1)
eGFR: 72 mL/min/{1.73_m2} (ref >=60)

## 2023-10-21 LAB — HEPATITIS C ANTIBODY: Hepatitis C Ab: NONREACTIVE

## 2023-11-26 DIAGNOSIS — F410 Panic disorder [episodic paroxysmal anxiety] without agoraphobia: Secondary | ICD-10-CM | POA: Diagnosis not present

## 2023-11-26 DIAGNOSIS — F331 Major depressive disorder, recurrent, moderate: Secondary | ICD-10-CM | POA: Diagnosis not present

## 2023-11-30 ENCOUNTER — Other Ambulatory Visit: Payer: Self-pay | Admitting: *Deleted

## 2023-11-30 DIAGNOSIS — K922 Gastrointestinal hemorrhage, unspecified: Secondary | ICD-10-CM

## 2023-11-30 MED ORDER — PANTOPRAZOLE SODIUM 40 MG PO TBEC: 40.0000 mg | DELAYED_RELEASE_TABLET | Freq: Every day | ORAL | 1 refills | Status: DC

## 2023-11-30 NOTE — Telephone Encounter (Signed)
 Pharmacy requested refill

## 2023-12-17 DIAGNOSIS — F331 Major depressive disorder, recurrent, moderate: Secondary | ICD-10-CM | POA: Diagnosis not present

## 2024-01-12 DIAGNOSIS — F331 Major depressive disorder, recurrent, moderate: Secondary | ICD-10-CM | POA: Diagnosis not present

## 2024-02-02 DIAGNOSIS — F320 Major depressive disorder, single episode, mild: Secondary | ICD-10-CM | POA: Diagnosis not present

## 2024-02-23 ENCOUNTER — Ambulatory Visit: Payer: Medicare Other | Admitting: Sports Medicine

## 2024-03-08 ENCOUNTER — Ambulatory Visit (INDEPENDENT_AMBULATORY_CARE_PROVIDER_SITE_OTHER): Admitting: Sports Medicine

## 2024-03-08 ENCOUNTER — Encounter: Payer: Self-pay | Admitting: Sports Medicine

## 2024-03-08 VITALS — BP 156/90 | HR 62 | Temp 97.7°F | Resp 16 | Ht 69.0 in | Wt 182.2 lb

## 2024-03-08 DIAGNOSIS — K219 Gastro-esophageal reflux disease without esophagitis: Secondary | ICD-10-CM

## 2024-03-08 DIAGNOSIS — K5900 Constipation, unspecified: Secondary | ICD-10-CM | POA: Diagnosis not present

## 2024-03-08 DIAGNOSIS — I10 Essential (primary) hypertension: Secondary | ICD-10-CM

## 2024-03-08 DIAGNOSIS — F320 Major depressive disorder, single episode, mild: Secondary | ICD-10-CM

## 2024-03-08 DIAGNOSIS — R5383 Other fatigue: Secondary | ICD-10-CM

## 2024-03-08 MED ORDER — AMLODIPINE BESYLATE 5 MG PO TABS: 5.0000 mg | ORAL_TABLET | Freq: Every day | ORAL | 1 refills | Status: DC

## 2024-03-08 MED ORDER — BLOOD PRESSURE KIT: 1.0000 | PACK | Freq: Every day | 0 refills | Status: AC

## 2024-03-08 NOTE — Progress Notes (Signed)
 Careteam: Patient Care Team: Tye Gall, MD as PCP - General (Internal Medicine) Felecia Hopper, MD as Consulting Physician (Gastroenterology)  PLACE OF SERVICE:  [facility]  Advanced Directive information Does Patient Have a Medical Advance Directive?: Yes, Type of Advance Directive: Living will;Healthcare Power of [place];Out of facility DNR (pink MOST or yellow form), Does patient want to make changes to medical advance directive?: No - Patient declined  Allergies  Allergen Reactions   Codeine Nausea Only    Chief Complaint  Patient presents with   Medical Management of Chronic Issues    4 month follow up. Discuss the need for Covid Booster, and AWV.      Discussed the use of AI scribe software for clinical note transcription with the patient, who gave verbal consent to proceed.  History of Present Illness    Jose Bender is a 78 year old male with hypertension who presents with elevated blood pressure readings.  He has elevated blood pressure readings of 158/100 mmHg and 150/90 mmHg during today's visit, with previous readings within normal limits. He denies recent changes in diet, stress, or physical activity, although he does not engage in regular exercise. He does not have a blood pressure monitor at home.  He is currently taking atorvastatin  for cholesterol management and adheres to this medication regimen. He also takes Wellbutrin and Zoloft  for depression, noting a 'little flat' mood but an improvement compared to the winter months. 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 He experiences occasional insomnia, typically sleeping from 2 AM to 11 AM, but prefers not to take sleeping pills.    He has a history of back surgery for a retro disc, resulting in decreased strength in his right leg, affecting his balance when standing. He acknowledges not drinking enough water, which may contribute to his dizziness  when standing up too quickly   No chest pain, shortness of breath, or gastrointestinal symptoms such as heartburn or acid reflux. He reports a history of weight loss, which has alleviated previous reflux symptoms. He experiences bowel movements every other day and uses stool softeners as needed.  Review of Systems:  Review of Systems  Constitutional:  Negative for chills and fever.  HENT:  Negative for congestion and sore throat.   Eyes:  Negative for double vision.  Respiratory:  Negative for cough, sputum production and shortness of breath.   Cardiovascular:  Negative for chest pain, palpitations and leg swelling.  Gastrointestinal:  Negative for abdominal pain, heartburn and nausea.  Genitourinary:  Negative for dysuria, frequency and hematuria.  Musculoskeletal:  Negative for falls and myalgias.  Neurological:  Positive for dizziness.  Psychiatric/Behavioral:  Positive for depression. Negative for suicidal ideas.    Negative unless indicated in HPI.   Past Medical History:  Diagnosis Date   Arthritis    Depression    Past Surgical History:  Procedure Laterality Date   BACK SURGERY     BIOPSY  08/12/2019   Procedure: BIOPSY;  Surgeon: Felecia Hopper, MD;  Location: [facility];  Service: Gastroenterology;;   BIOPSY  09/04/2021   Procedure: BIOPSY;  Surgeon: Felecia Hopper, MD;  Location: [facility];  Service: Gastroenterology;;   CATARACT EXTRACTION     Per [facility] New Patient Packet   COLONOSCOPY     Per [facility] New Patient Packet   ESOPHAGOGASTRODUODENOSCOPY N/A 09/04/2021   Procedure: ESOPHAGOGASTRODUODENOSCOPY (EGD);  Surgeon: Felecia Hopper, MD;  Location: [facility];  Service: Gastroenterology;  Laterality: N/A;  ESOPHAGOGASTRODUODENOSCOPY (EGD) WITH PROPOFOL  N/A 08/12/2019   Procedure: ESOPHAGOGASTRODUODENOSCOPY (EGD) WITH PROPOFOL ;  Surgeon: Felecia Hopper, MD;  Location: [facility];  Service: Gastroenterology;  Laterality: N/A;   EYE SURGERY Bilateral     cataracts   KNEE ARTHROSCOPY Left    LUMBAR LAMINECTOMY/DECOMPRESSION MICRODISCECTOMY N/A 06/27/2019   Procedure: L3-4 HEMILAMINECTOMY DISCECTOMY;  Surgeon: Berta Brittle, MD;  Location: [facility] ORS;  Service: Neurosurgery;  Laterality: N/A;   UPPER GI ENDOSCOPY  2021   Per [facility] New Patient Packet, Dr.Brahmbatt   Social History:   reports that he quit smoking about 19 years ago. His smoking use included cigarettes. He started smoking about 34 years ago. He has a 15 pack-year smoking history. He has never used smokeless tobacco. He reports current alcohol use. He reports that he does not use drugs.  Family History  Problem Relation Age of Onset   Stroke Mother    Pneumonia Mother    Arthritis Mother    Cancer Father    Stroke Maternal Grandmother    Cancer Maternal Grandfather     Medications: Patient's Medications  New Prescriptions   No medications on file  Previous Medications   ACETAMINOPHEN  (TYLENOL ) 500 MG TABLET    Take 500 mg by mouth as needed (pain).   ATORVASTATIN  (LIPITOR) 40 MG TABLET    Take 1 tablet (40 mg total) by mouth daily.   BUPROPION (WELLBUTRIN XL) 150 MG 24 HR TABLET    Take 150 mg by mouth daily.   MULTIPLE VITAMIN (MULTIVITAMIN WITH MINERALS) TABS TABLET    Take 1 tablet by mouth daily.   PANTOPRAZOLE  (PROTONIX ) 40 MG TABLET    Take 1 tablet (40 mg total) by mouth daily.   SERTRALINE  (ZOLOFT ) 100 MG TABLET    Take 200 mg by mouth at bedtime.  Modified Medications   No medications on file  Discontinued Medications   No medications on file    Physical Exam: Vitals:   03/08/24 1433  BP: (!) 158/100  Pulse: 62  Resp: 16  Temp: 97.7 F (36.5 C)  SpO2: 96%  Weight: 182 lb 3.2 oz (82.6 kg)  Height: 5\' 9"  (1.753 m)   Body mass index is 26.91 kg/m. BP Readings from Last 3 Encounters:  03/08/24 (!) 158/100  10/20/23 124/70  07/22/23 124/68   Wt Readings from Last 3 Encounters:  03/08/24 182 lb 3.2 oz (82.6 kg)  10/20/23 177 lb 6.4 oz (80.5 kg)   07/22/23 172 lb (78 kg)    Physical Exam Constitutional:      Appearance: Normal appearance.  HENT:     Head: Normocephalic and atraumatic.  Cardiovascular:     Rate and Rhythm: Normal rate and regular rhythm.     Pulses: Normal pulses.     Heart sounds: Normal heart sounds.  Pulmonary:     Effort: No respiratory distress.     Breath sounds: No stridor. No wheezing or rales.  Abdominal:     General: Bowel sounds are normal. There is no distension.     Palpations: Abdomen is soft.     Tenderness: There is no abdominal tenderness. There is no right CVA tenderness or guarding.  Neurological:     Mental Status: He is alert. Mental status is at baseline.     Labs reviewed: Basic Metabolic Panel: Recent Labs    03/18/23 1547 10/20/23 1425  NA 143 141  K 3.9 4.2  CL 109 107  CO2 25 29  GLUCOSE 101* 100*  BUN 26* 18  CREATININE 1.15 1.07  CALCIUM  9.5 9.7   Liver Function Tests: Recent Labs    03/18/23 1547 10/20/23 1425  AST 14 20  ALT 14 23  BILITOT 0.4 0.4  PROT 6.5 6.5   No results for input(s): "LIPASE", "AMYLASE" in the last 8760 hours. No results for input(s): "AMMONIA" in the last 8760 hours. CBC: No results for input(s): "WBC", "NEUTROABS", "HGB", "HCT", "MCV", "PLT" in the last 8760 hours. Lipid Panel: Recent Labs    03/18/23 1547  CHOL 194  HDL 50  LDLCALC 119*  TRIG 131  CHOLHDL 3.9   TSH: No results for input(s): "TSH" in the last 8760 hours. A1C: Lab Results  Component Value Date   HGBA1C 5.0 09/16/2021    Assessment and Plan Assessment & Plan   1. Primary hypertension (Primary) Will start amlodipine  Check bp daily  Keep a log, follow up in 4 weeks  - Blood Pressure KIT; 1 Device by Does not apply route daily.  Dispense: 1 kit; Refill: 0 - amLODipine  (NORVASC ) 5 MG tablet; Take 1 tablet (5 mg total) by mouth daily.  Dispense: 90 tablet; Refill: 1 - Basic Metabolic Panel with eGFR  2. Current mild episode of major depressive  disorder without prior episode (HCC) Stable Cont with the same  3. Gastroesophageal reflux disease, unspecified whether esophagitis present  Denies acid reflux Stable Cont with protonix   4. Constipation, unspecified constipation type  Increase water intake Take colace daily   5. Tiredness - CBC (no diff)

## 2024-03-09 DIAGNOSIS — F320 Major depressive disorder, single episode, mild: Secondary | ICD-10-CM | POA: Diagnosis not present

## 2024-03-09 LAB — BASIC METABOLIC PANEL WITHOUT GFR
BUN: 20 mg/dL (ref 7–25)
CO2: 23 mmol/L (ref 20–32)
Calcium: 9.3 mg/dL (ref 8.6–10.3)
Chloride: 110 mmol/L (ref 98–110)
Creat: 1.07 mg/dL (ref 0.70–1.28)
Glucose, Bld: 92 mg/dL (ref 65–139)
Potassium: 3.5 mmol/L (ref 3.5–5.3)
Sodium: 142 mmol/L (ref 135–146)

## 2024-03-09 LAB — CBC
HCT: 39.4 % (ref 38.5–50.0)
Hemoglobin: 12.6 g/dL — ABNORMAL LOW (ref 13.2–17.1)
MCH: 27.9 pg (ref 27.0–33.0)
MCHC: 32 g/dL (ref 32.0–36.0)
MCV: 87.2 fL (ref 80.0–100.0)
MPV: 11.1 fL (ref 7.5–12.5)
Platelets: 165 10*3/uL (ref 140–400)
RBC: 4.52 10*6/uL (ref 4.20–5.80)
RDW: 14.2 % (ref 11.0–15.0)
WBC: 7.3 10*3/uL (ref 3.8–10.8)

## 2024-03-29 DIAGNOSIS — F320 Major depressive disorder, single episode, mild: Secondary | ICD-10-CM | POA: Diagnosis not present

## 2024-04-05 DIAGNOSIS — F320 Major depressive disorder, single episode, mild: Secondary | ICD-10-CM | POA: Diagnosis not present

## 2024-04-06 ENCOUNTER — Ambulatory Visit: Admitting: Sports Medicine

## 2024-04-12 ENCOUNTER — Ambulatory Visit: Admitting: Sports Medicine

## 2024-04-12 ENCOUNTER — Encounter: Payer: Self-pay | Admitting: Sports Medicine

## 2024-04-12 VITALS — BP 142/80 | HR 56 | Temp 97.3°F | Ht 69.0 in | Wt 180.0 lb

## 2024-04-12 DIAGNOSIS — F32A Depression, unspecified: Secondary | ICD-10-CM | POA: Diagnosis not present

## 2024-04-12 DIAGNOSIS — E782 Mixed hyperlipidemia: Secondary | ICD-10-CM | POA: Diagnosis not present

## 2024-04-12 DIAGNOSIS — K219 Gastro-esophageal reflux disease without esophagitis: Secondary | ICD-10-CM

## 2024-04-12 DIAGNOSIS — I10 Essential (primary) hypertension: Secondary | ICD-10-CM

## 2024-04-12 MED ORDER — AMLODIPINE BESYLATE 10 MG PO TABS: 10.0000 mg | ORAL_TABLET | Freq: Every day | ORAL | 2 refills | Status: DC

## 2024-04-12 NOTE — Progress Notes (Unsigned)
 Careteam: Patient Care Team: Tye Gall, MD as PCP - General (Internal Medicine) Felecia Hopper, MD as Consulting Physician (Gastroenterology)  PLACE OF SERVICE:  [facility] CLIN[facility]  Advanced Directive information    Allergies  Allergen Reactions   Codeine Nausea Only    Chief Complaint  Patient presents with   Hypertension    4 week follow up , follow up on checking B/p daily and new start of b/p medication  amLODipine  5mg  daily      Discussed the use of AI scribe software for clinical note transcription with the patient, who gave verbal consent to proceed.  History of Present Illness   Jose Bender is a 78 year old male with hypertension who presents for a follow-up on his blood pressure management.  He has been using a wrist blood pressure monitor at home but did not bring his readings. He estimates his home readings are similar to today's measurements, possibly a bit lower. No dizziness or lightheadedness is reported.    denies any recent falls. He did not sleep well the previous night, attributing it to his habit of staying up late reading, which is common for him. He takes naps during the day, which may affect his nighttime sleep. He is not seeking medication for sleep issues.  He continues to take Zoloft  and Wellbutrin as prescribed with no changes in mood, stating he feels 'pretty good'.  In terms of physical activity, he mentions walking and stretching his legs due to some weakness. He lives alone but receives assistance from a friend for transportation and laundry. He orders groceries online and occasionally goes out for haircuts. 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  No shortness of breath, dizziness upon standing, or gastrointestinal issues are reported, though he occasionally experiences gas pain that resolves on its own. He reports some urinary hesitancy and dribbling, which he attributes to age-related changes.  Review of  Systems:  Review of Systems  Constitutional:  Negative for chills and fever.  HENT:  Negative for congestion and sore throat.   Eyes:  Negative for double vision.  Respiratory:  Negative for cough, sputum production and shortness of breath.   Cardiovascular:  Negative for chest pain, palpitations and leg swelling.  Gastrointestinal:  Negative for abdominal pain, heartburn and nausea.  Genitourinary:  Negative for dysuria, frequency and hematuria.  Musculoskeletal:  Negative for falls and myalgias.  Neurological:  Negative for dizziness, sensory change and focal weakness.   Negative unless indicated in HPI.   Past Medical History:  Diagnosis Date   Arthritis    Depression    Past Surgical History:  Procedure Laterality Date   BACK SURGERY     BIOPSY  08/12/2019   Procedure: BIOPSY;  Surgeon: Felecia Hopper, MD;  Location: [facility];  Service: Gastroenterology;;   BIOPSY  09/04/2021   Procedure: BIOPSY;  Surgeon: Felecia Hopper, MD;  Location: [facility];  Service: Gastroenterology;;   CATARACT EXTRACTION     Per [facility] New Patient Packet   COLONOSCOPY     Per [facility] New Patient Packet   ESOPHAGOGASTRODUODENOSCOPY N/A 09/04/2021   Procedure: ESOPHAGOGASTRODUODENOSCOPY (EGD);  Surgeon: Felecia Hopper, MD;  Location: [facility];  Service: Gastroenterology;  Laterality: N/A;   ESOPHAGOGASTRODUODENOSCOPY (EGD) WITH PROPOFOL  N/A 08/12/2019   Procedure: ESOPHAGOGASTRODUODENOSCOPY (EGD) WITH PROPOFOL ;  Surgeon: Felecia Hopper, MD;  Location: [facility];  Service: Gastroenterology;  Laterality: N/A;   EYE SURGERY Bilateral    cataracts   KNEE ARTHROSCOPY Left  LUMBAR LAMINECTOMY/DECOMPRESSION MICRODISCECTOMY N/A 06/27/2019   Procedure: L3-4 HEMILAMINECTOMY DISCECTOMY;  Surgeon: Berta Brittle, MD;  Location: [facility] ORS;  Service: Neurosurgery;  Laterality: N/A;   UPPER GI ENDOSCOPY  2021   Per [facility] New Patient Packet, Dr.Brahmbatt   Social History:   reports that he quit  smoking about 19 years ago. His smoking use included cigarettes. He started smoking about 34 years ago. He has a 15 pack-year smoking history. He has never used smokeless tobacco. He reports current alcohol use. He reports that he does not use drugs.  Family History  Problem Relation Age of Onset   Stroke Mother    Pneumonia Mother    Arthritis Mother    Cancer Father    Stroke Maternal Grandmother    Cancer Maternal Grandfather     Medications: Patient's Medications  New Prescriptions   No medications on file  Previous Medications   ACETAMINOPHEN  (TYLENOL ) 500 MG TABLET    Take 500 mg by mouth as needed (pain).   AMLODIPINE  (NORVASC ) 5 MG TABLET    Take 1 tablet (5 mg total) by mouth daily.   ATORVASTATIN  (LIPITOR) 40 MG TABLET    Take 1 tablet (40 mg total) by mouth daily.   BLOOD PRESSURE KIT    1 Device by Does not apply route daily.   BUPROPION (WELLBUTRIN XL) 150 MG 24 HR TABLET    Take 150 mg by mouth daily.   MULTIPLE VITAMIN (MULTIVITAMIN WITH MINERALS) TABS TABLET    Take 1 tablet by mouth daily.   PANTOPRAZOLE  (PROTONIX ) 40 MG TABLET    Take 1 tablet (40 mg total) by mouth daily.   SERTRALINE  (ZOLOFT ) 100 MG TABLET    Take 200 mg by mouth at bedtime.  Modified Medications   No medications on file  Discontinued Medications   No medications on file    Physical Exam: Vitals:   04/12/24 1425  BP: (!) 152/82  Pulse: (!) 45  Temp: (!) 97.3 F (36.3 C)  SpO2: 98%  Weight: 180 lb (81.6 kg)  Height: 5\' 9"  (1.753 m)   Body mass index is 26.58 kg/m. BP Readings from Last 3 Encounters:  04/12/24 (!) 152/82  03/08/24 (!) 156/90  10/20/23 124/70   Wt Readings from Last 3 Encounters:  04/12/24 180 lb (81.6 kg)  03/08/24 182 lb 3.2 oz (82.6 kg)  10/20/23 177 lb 6.4 oz (80.5 kg)    Physical Exam Constitutional:      Appearance: Normal appearance.  HENT:     Head: Normocephalic and atraumatic.  Cardiovascular:     Rate and Rhythm: Normal rate and regular rhythm.      Pulses: Normal pulses.     Heart sounds: Normal heart sounds.  Pulmonary:     Effort: No respiratory distress.     Breath sounds: No stridor. No wheezing or rales.  Abdominal:     General: Bowel sounds are normal. There is no distension.     Palpations: Abdomen is soft.     Tenderness: There is no abdominal tenderness. There is no right CVA tenderness or guarding.  Musculoskeletal:        General: No swelling.  Neurological:     Mental Status: He is alert. Mental status is at baseline.     Motor: No weakness.     Labs reviewed: Basic Metabolic Panel: Recent Labs    10/20/23 1425 03/08/24 1519  NA 141 142  K 4.2 3.5  CL 107 110  CO2 29 23  GLUCOSE 100* 92  BUN 18 20  CREATININE 1.07 1.07  CALCIUM  9.7 9.3   Liver Function Tests: Recent Labs    10/20/23 1425  AST 20  ALT 23  BILITOT 0.4  PROT 6.5   No results for input(s): "LIPASE", "AMYLASE" in the last 8760 hours. No results for input(s): "AMMONIA" in the last 8760 hours. CBC: Recent Labs    03/08/24 1519  WBC 7.3  HGB 12.6*  HCT 39.4  MCV 87.2  PLT 165   Lipid Panel: No results for input(s): "CHOL", "HDL", "LDLCALC", "TRIG", "CHOLHDL", "LDLDIRECT" in the last 8760 hours. TSH: No results for input(s): "TSH" in the last 8760 hours. A1C: Lab Results  Component Value Date   HGBA1C 5.0 09/16/2021    Assessment and Plan Assessment & Plan  1. Primary hypertension (Primary) Repeat bp improved slightly Instructed patient to monitor BP and keep a log  Avoid salty foods - amLODipine  (NORVASC ) 10 MG tablet; Take 1 tablet (10 mg total) by mouth daily.  Dispense: 90 tablet; Refill: 2  2. Mixed hyperlipidemia Cont with lipitor  3. Depression, unspecified depression type Stable Cont with wellbutrin, zoloft   4. Gastroesophageal reflux disease, unspecified whether esophagitis present Cont with protonix 

## 2024-04-15 MED ORDER — AMLODIPINE BESYLATE 10 MG PO TABS: 10.0000 mg | ORAL_TABLET | Freq: Every day | ORAL | 2 refills | Status: DC

## 2024-06-03 ENCOUNTER — Other Ambulatory Visit: Payer: Self-pay | Admitting: Sports Medicine

## 2024-06-03 DIAGNOSIS — K922 Gastrointestinal hemorrhage, unspecified: Secondary | ICD-10-CM

## 2024-06-14 ENCOUNTER — Encounter: Payer: Self-pay | Admitting: Family

## 2024-06-14 ENCOUNTER — Ambulatory Visit (INDEPENDENT_AMBULATORY_CARE_PROVIDER_SITE_OTHER): Admitting: Family

## 2024-06-14 DIAGNOSIS — Z0000 Encounter for general adult medical examination without abnormal findings: Secondary | ICD-10-CM

## 2024-06-14 NOTE — Patient Instructions (Signed)
 [name] , Thank you for taking time to come for your Medicare Wellness Visit. I appreciate your ongoing commitment to your health goals. Please review the following plan we discussed and let me know if I can assist you in the future.   Screening recommendations/referrals: Colonoscopy : N/A  Recommended yearly ophthalmology/optometry visit for glaucoma screening and checkup Recommended yearly dental visit for hygiene and checkup  Vaccinations: Influenza vaccine due annually in September/October Pneumococcal vaccine : Up to date  Tdap vaccine  : Up to date  Shingles vaccine  : Up to date     Advanced directives: yes   Conditions/risks identified: advanced age (>20men, >3 women);male gender;hypertension;smoking/ tobacco exposure  Next appointment: 1 year   Preventive Care 46 Years and Older, Male Preventive care refers to lifestyle choices and visits with your health care provider that can promote health and wellness. What does preventive care include? A yearly physical exam. This is also called an annual well check. Dental exams once or twice a year. Routine eye exams. Ask your health care provider how often you should have your eyes checked. Personal lifestyle choices, including: Daily care of your teeth and gums. Regular physical activity. Eating a healthy diet. Avoiding tobacco and drug use. Limiting alcohol use. Practicing safe sex. Taking low doses of aspirin every day. Taking vitamin and mineral supplements as recommended by your health care provider. What happens during an annual well check? The services and screenings done by your health care provider during your annual well check will depend on your age, overall health, lifestyle risk factors, and family history of disease. Counseling  Your health care provider may ask you questions about your: Alcohol use. Tobacco use. Drug use. Emotional well-being. Home and relationship well-being. Sexual activity. Eating  habits. History of falls. Memory and ability to understand (cognition). Work and work Astronomer. Screening  You may have the following tests or measurements: Height, weight, and BMI. Blood pressure. Lipid and cholesterol levels. These may be checked every 5 years, or more frequently if you are over 78 years old. Skin check. Lung cancer screening. You may have this screening every year starting at age 67 if you have a 30-pack-year history of smoking and currently smoke or have quit within the past 15 years. Fecal occult blood test (FOBT) of the stool. You may have this test every year starting at age 72. Flexible sigmoidoscopy or colonoscopy. You may have a sigmoidoscopy every 5 years or a colonoscopy every 10 years starting at age 74. Prostate cancer screening. Recommendations will vary depending on your family history and other risks. Hepatitis C blood test. Hepatitis B blood test. Sexually transmitted disease (STD) testing. Diabetes screening. This is done by checking your blood sugar (glucose) after you have not eaten for a while (fasting). You may have this done every 1-3 years. Abdominal aortic aneurysm (AAA) screening. You may need this if you are a current or former smoker. Osteoporosis. You may be screened starting at age 76 if you are at high risk. Talk with your health care provider about your test results, treatment options, and if necessary, the need for more tests. Vaccines  Your health care provider may recommend certain vaccines, such as: Influenza vaccine. This is recommended every year. Tetanus, diphtheria, and acellular pertussis (Tdap, Td) vaccine. You may need a Td booster every 10 years. Zoster vaccine. You may need this after age 58. Pneumococcal 13-valent conjugate (PCV13) vaccine. One dose is recommended after age 20. Pneumococcal polysaccharide (PPSV23) vaccine. One dose  is recommended after age 34. Talk to your health care provider about which screenings and  vaccines you need and how often you need them. This information is not intended to replace advice given to you by your health care provider. Make sure you discuss any questions you have with your health care provider. Document Released: 11/30/2015 Document Revised: 07/23/2016 Document Reviewed: 09/04/2015 Elsevier Interactive Patient Education  2017 [employer].  Fall Prevention in the Home Falls can cause injuries. They can happen to people of all ages. There are many things you can do to make your home safe and to help prevent falls. What can I do on the outside of my home? Regularly fix the edges of walkways and driveways and fix any cracks. Remove anything that might make you trip as you walk through a door, such as a raised step or threshold. Trim any bushes or trees on the path to your home. Use bright outdoor lighting. Clear any walking paths of anything that might make someone trip, such as rocks or tools. Regularly check to see if handrails are loose or broken. Make sure that both sides of any steps have handrails. Any raised decks and porches should have guardrails on the edges. Have any leaves, snow, or ice cleared regularly. Use sand or salt on walking paths during winter. Clean up any spills in your garage right away. This includes oil or grease spills. What can I do in the bathroom? Use night lights. Install grab bars by the toilet and in the tub and shower. Do not use towel bars as grab bars. Use non-skid mats or decals in the tub or shower. If you need to sit down in the shower, use a plastic, non-slip stool. Keep the floor dry. Clean up any water that spills on the floor as soon as it happens. Remove soap buildup in the tub or shower regularly. Attach bath mats securely with double-sided non-slip rug tape. Do not have throw rugs and other things on the floor that can make you trip. What can I do in the bedroom? Use night lights. Make sure that you have a light by your  bed that is easy to reach. Do not use any sheets or blankets that are too big for your bed. They should not hang down onto the floor. Have a firm chair that has side arms. You can use this for support while you get dressed. Do not have throw rugs and other things on the floor that can make you trip. What can I do in the kitchen? Clean up any spills right away. Avoid walking on wet floors. Keep items that you use a lot in easy-to-reach places. If you need to reach something above you, use a strong step stool that has a grab bar. Keep electrical cords out of the way. Do not use floor polish or wax that makes floors slippery. If you must use wax, use non-skid floor wax. Do not have throw rugs and other things on the floor that can make you trip. What can I do with my stairs? Do not leave any items on the stairs. Make sure that there are handrails on both sides of the stairs and use them. Fix handrails that are broken or loose. Make sure that handrails are as long as the stairways. Check any carpeting to make sure that it is firmly attached to the stairs. Fix any carpet that is loose or worn. Avoid having throw rugs at the top or bottom of the stairs.  If you do have throw rugs, attach them to the floor with carpet tape. Make sure that you have a light switch at the top of the stairs and the bottom of the stairs. If you do not have them, ask someone to add them for you. What else can I do to help prevent falls? Wear shoes that: Do not have high heels. Have rubber bottoms. Are comfortable and fit you well. Are closed at the toe. Do not wear sandals. If you use a stepladder: Make sure that it is fully opened. Do not climb a closed stepladder. Make sure that both sides of the stepladder are locked into place. Ask someone to hold it for you, if possible. Clearly mark and make sure that you can see: Any grab bars or handrails. First and last steps. Where the edge of each step is. Use tools that  help you move around (mobility aids) if they are needed. These include: Canes. Walkers. Scooters. Crutches. Turn on the lights when you go into a dark area. Replace any light bulbs as soon as they burn out. Set up your furniture so you have a clear path. Avoid moving your furniture around. If any of your floors are uneven, fix them. If there are any pets around you, be aware of where they are. Review your medicines with your doctor. Some medicines can make you feel dizzy. This can increase your chance of falling. Ask your doctor what other things that you can do to help prevent falls. This information is not intended to replace advice given to you by your health care provider. Make sure you discuss any questions you have with your health care provider. Document Released: 08/30/2009 Document Revised: 04/10/2016 Document Reviewed: 12/08/2014 Elsevier Interactive Patient Education  2017 [employer].

## 2024-06-14 NOTE — Progress Notes (Signed)
 This service is provided via telemedicine  No vital signs collected/recorded due to the encounter was a telemedicine visit.   Location of patient (ex: home, work):  Home  Patient consents to a telephone visit: Yes  Location of the provider (ex: office, home):  [facility], Office   Name of any referring provider:  N/A  Names of all persons participating in the telemedicine service and their role in the encounter:  Shelli Ferrier, CMA, Patient, and Avannah Decker C,NP  Time spent on call:  9 min with medical assistant      Subjective:   Jose Bender is a 78 y.o. male who presents for Medicare Annual/Subsequent preventive examination.  Visit Complete: In person  Patient Medicare AWV questionnaire was completed by the patient on 06/14/2024; I have confirmed that all information answered by patient is correct and no changes since this date.  Cardiac Risk Factors include: advanced age (>54men, >68 women);male gender;hypertension;smoking/ tobacco exposure     Objective:    There were no vitals filed for this visit. There is no height or weight on file to calculate BMI.     06/14/2024    3:15 PM 03/08/2024    2:37 PM 10/20/2023    1:34 PM 07/22/2023    2:57 PM 11/28/2022    2:32 PM 01/06/2022    3:36 PM 01/02/2022    6:55 PM  Advanced Directives  Does Patient Have a Medical Advance Directive? Yes Yes Yes Yes Yes No No  Type of Advance Directive Living will Living will;Healthcare Power of Attorney;Out of facility DNR (pink MOST or yellow form) Healthcare Power of [place];Living will Healthcare Power of [place];Living will;Out of facility DNR (pink MOST or yellow form) Living will    Does patient want to make changes to medical advance directive? No - Patient declined No - Patient declined No - Patient declined No - Patient declined No - Patient declined    Copy of Healthcare Power of Attorney in Chart?  No - copy requested Yes - validated most recent  copy scanned in chart (See row information) No - copy requested     Would patient like information on creating a medical advance directive?      No - Patient declined No - Patient declined    Current Medications (verified) Outpatient Encounter Medications as of 06/14/2024  Medication Sig   acetaminophen  (TYLENOL ) 500 MG tablet Take 500 mg by mouth as needed (pain).   amLODipine  (NORVASC ) 10 MG tablet Take 1 tablet (10 mg total) by mouth daily.   atorvastatin  (LIPITOR) 40 MG tablet Take 1 tablet (40 mg total) by mouth daily.   Blood Pressure KIT 1 Device by Does not apply route daily.   buPROPion (WELLBUTRIN XL) 150 MG 24 hr tablet Take 150 mg by mouth daily.   Multiple Vitamin (MULTIVITAMIN WITH MINERALS) TABS tablet Take 1 tablet by mouth daily.   pantoprazole  (PROTONIX ) 40 MG tablet TAKE 1 TABLET BY MOUTH DAILY   sertraline  (ZOLOFT ) 100 MG tablet Take 200 mg by mouth at bedtime.   No facility-administered encounter medications on file as of 06/14/2024.    Allergies (verified) Codeine   History: Past Medical History:  Diagnosis Date   Arthritis    Depression    Past Surgical History:  Procedure Laterality Date   BACK SURGERY     BIOPSY  08/12/2019   Procedure: BIOPSY;  Surgeon: Elicia Claw, MD;  Location: [facility];  Service: Gastroenterology;;   BIOPSY  09/04/2021  Procedure: BIOPSY;  Surgeon: Elicia Claw, MD;  Location: [facility];  Service: Gastroenterology;;   CATARACT EXTRACTION     Per [facility] New Patient Packet   COLONOSCOPY     Per [facility] New Patient Packet   ESOPHAGOGASTRODUODENOSCOPY N/A 09/04/2021   Procedure: ESOPHAGOGASTRODUODENOSCOPY (EGD);  Surgeon: Elicia Claw, MD;  Location: [facility];  Service: Gastroenterology;  Laterality: N/A;   ESOPHAGOGASTRODUODENOSCOPY (EGD) WITH PROPOFOL  N/A 08/12/2019   Procedure: ESOPHAGOGASTRODUODENOSCOPY (EGD) WITH PROPOFOL ;  Surgeon: Elicia Claw, MD;  Location: [facility];  Service: Gastroenterology;   Laterality: N/A;   EYE SURGERY Bilateral    cataracts   KNEE ARTHROSCOPY Left    LUMBAR LAMINECTOMY/DECOMPRESSION MICRODISCECTOMY N/A 06/27/2019   Procedure: L3-4 HEMILAMINECTOMY DISCECTOMY;  Surgeon: Bluford Standing, MD;  Location: [facility] ORS;  Service: Neurosurgery;  Laterality: N/A;   UPPER GI ENDOSCOPY  2021   Per [facility] New Patient Packet, Dr.Brahmbatt   Family History  Problem Relation Age of Onset   Stroke Mother    Pneumonia Mother    Arthritis Mother    Cancer Father    Stroke Maternal Grandmother    Cancer Maternal Grandfather    Social History   Socioeconomic History   Marital status: Single    Spouse name: Not on file   Number of children: Not on file   Years of education: Not on file   Highest education level: Bachelor's degree (e.g., BA, AB, BS)  Occupational History   Not on file  Tobacco Use   Smoking status: Former    Current packs/day: 0.00    Average packs/day: 1 pack/day for 15.0 years (15.0 ttl pk-yrs)    Types: Cigarettes    Start date: 06/22/1989    Quit date: 06/22/2004    Years since quitting: 19.9   Smokeless tobacco: Never  Vaping Use   Vaping status: Never Used  Substance and Sexual Activity   Alcohol use: Yes    Comment: occassion   Drug use: Never   Sexual activity: Not on file  Other Topics Concern   Not on file  Social History Narrative   Per [facility] New Patient Packet abstracted on 10/04/2021 @ 11:05 am      Diet: Left blank      Caffeine: No      Married, if yes what year: No      Do you live in a house, apartment, assisted living, condo, trailer, ect: Condo      Is it one or more stories: Yes, 1 stories       How many persons live in your home? 1 person       Pets: No      Highest level or education completed: BA      Current/Past profession: Runner, broadcasting/film/video, High school english       Exercise:         yes, not much         Type and how often: walking          Living Will: Left blank   DNR: Left blank    POA/HPOA: Left blank        Functional Status:   Do you have difficulty bathing or dressing yourself? No   Do you have difficulty preparing food or eating? No   Do you have difficulty managing your medications? No   Do you have difficulty managing your finances? No   Do you have difficulty affording your medications? No   Social Drivers of Corporate investment banker Strain: [employer]  Risk (06/11/2024)   Overall Financial Resource Strain (CARDIA)    Difficulty of Paying Living Expenses: Somewhat hard  Food Insecurity: Food Insecurity Present (06/11/2024)   Hunger Vital Sign    Worried About Running Out of Food in the Last Year: Sometimes true    Ran Out of Food in the Last Year: Sometimes true  Transportation Needs: No Transportation Needs (06/11/2024)   PRAPARE - Administrator, Civil Service (Medical): No    Lack of Transportation (Non-Medical): No  Physical Activity: Unknown (06/11/2024)   Exercise Vital Sign    Days of Exercise per Week: 1 day    Minutes of Exercise per Session: Not on file  Stress: No Stress Concern Present (06/11/2024)   [employer] of Occupational Health - Occupational Stress Questionnaire    Feeling of Stress: Not at all  Social Connections: Unknown (06/11/2024)   Social Connection and Isolation Panel    Frequency of Communication with Friends and Family: More than three times a week    Frequency of Social Gatherings with Friends and Family: Once a week    Attends Religious Services: Never    Database administrator or Organizations: No    Attends Engineer, structural: Not on file    Marital Status: Not on file    Tobacco Counseling Counseling given: Not Answered   Clinical Intake:  Pre-visit preparation completed: No  Pain : No/denies pain     BMI - recorded: 26.58 Nutritional Status: BMI 25 -29 Overweight Nutritional Risks: None Diabetes: No  How often do you need to have someone help you when you read instructions, pamphlets, or other written  materials from your doctor or pharmacy?: 3 - Sometimes (sometimes if labels are small uses a margifying glass) What is the last grade level you completed in school?: BA college  Interpreter Needed?: No      Activities of Daily Living    06/14/2024    3:36 PM  In your present state of health, do you have any difficulty performing the following activities:  Hearing? 1  Comment wears hearing aids  Vision? 0  Difficulty concentrating or making decisions? 1  Comment Remembering  Walking or climbing stairs? 1  Comment walker  Dressing or bathing? 0  Doing errands, shopping? 1  Preparing Food and eating ? N  Using the Toilet? N  In the past six months, have you accidently leaked urine? Y  Do you have problems with loss of bowel control? N  Managing your Medications? N  Managing your Finances? N  Housekeeping or managing your Housekeeping? N    Patient Care Team: Sherlynn Madden, MD as PCP - General (Internal Medicine) Elicia Claw, MD as Consulting Physician (Gastroenterology)  Indicate any recent Medical Services you may have received from other than [facility] providers in the past year (date may be approximate).     Assessment:   This is a routine wellness examination for [place].  Hearing/Vision screen Hearing Screening - Comments:: Hearing issues pt wear hearing aids Vision Screening - Comments:: Last eye exam 2 years ago pt had cataract surgery no issues   Goals Addressed             This Visit's Progress    Patient Stated       Stay steady on balance        Depression Screen    06/14/2024    3:17 PM 04/12/2024    2:38 PM 03/08/2024    3:03 PM 10/20/2023  1:35 PM 07/22/2023    2:57 PM 03/18/2023    3:00 PM 01/29/2022   10:37 AM  PHQ 2/9 Scores  PHQ - 2 Score 1 0 1 0 0 0 0  PHQ- 9 Score    2  0     Fall Risk    06/14/2024    3:17 PM 04/12/2024    2:38 PM 03/08/2024    2:36 PM 10/20/2023    1:33 PM 07/22/2023    2:57 PM  Fall Risk   Falls in the  past year? 1 0 0 1 0  Number falls in past yr: 1 0 0 0 0  Injury with Fall? 0 1 0 0 0  Risk for fall due to : History of fall(s)  History of fall(s) Impaired balance/gait;Impaired mobility No Fall Risks  Follow up Falls evaluation completed  Falls evaluation completed Falls evaluation completed;Education provided;Falls prevention discussed Falls evaluation completed    MEDICARE RISK AT HOME: Medicare Risk at Home Any stairs in or around the home?: Yes If so, are there any without handrails?: No Home free of loose throw rugs in walkways, pet beds, electrical cords, etc?: No Adequate lighting in your home to reduce risk of falls?: Yes Life alert?: No Use of a cane, walker or w/c?: Yes Grab bars in the bathroom?: Yes Shower chair or bench in shower?: Yes Elevated toilet seat or a handicapped toilet?: No  TIMED UP AND GO:  Was the test performed?  No    Cognitive Function:        Immunizations Immunization History  Administered Date(s) Administered   Fluad Quad(high Dose 65+) 08/17/2021   Fluad Trivalent(High Dose 65+) 07/22/2023   Influenza,inj,Quad PF,6+ Mos 08/23/2018   Influenza,inj,quad, With Preservative 09/26/2014   Influenza-Unspecified 11/05/2011, 08/05/2022   Moderna Covid-19 Fall Seasonal Vaccine 4yrs & older 10/29/2022   Moderna SARS-COV2 Booster Vaccination 09/10/2020   Moderna Sars-Covid-2 Vaccination 12/31/2019, 01/28/2020, 09/10/2020   Pneumococcal Conjugate-13 08/23/2018   Pneumococcal Polysaccharide-23 09/20/2013   RSV,unspecified 11/26/2022   Td 02/17/2012   Td (Adult),5 Lf Tetanus Toxid, Preservative Free 02/17/2012   Tdap 01/02/2022   Zoster Recombinant(Shingrix) 08/30/2018, 12/01/2018    TDAP status: Up to date  Flu Vaccine status: Up to date  Pneumococcal vaccine status: Up to date  Covid-19 vaccine status: Information provided on how to obtain vaccines.   Qualifies for Shingles Vaccine? Yes   Zostavax completed No   Shingrix Completed?:  Yes  Screening Tests Health Maintenance  Topic Date Due   COVID-19 Vaccine (6 - 2024-25 season) 07/19/2023   INFLUENZA VACCINE  06/17/2024   Medicare Annual Wellness (AWV)  06/14/2025   DTaP/Tdap/Td (4 - Td or Tdap) 01/03/2032   Pneumococcal Vaccine: 50+ Years  Completed   Hepatitis C Screening  Completed   Zoster Vaccines- Shingrix  Completed   Hepatitis B Vaccines  Aged Out   HPV VACCINES  Aged Out   Meningococcal B Vaccine  Aged Out    Health Maintenance  Health Maintenance Due  Topic Date Due   COVID-19 Vaccine (6 - 2024-25 season) 07/19/2023    Colorectal cancer screening: No longer required.   Lung Cancer Screening: (Low Dose CT Chest recommended if Age 58-80 years, 20 pack-year currently smoking OR have quit w/in 15years.) does not qualify.   Lung Cancer Screening Referral: N/A   Additional Screening:  Hepatitis C Screening: does qualify; Completed yes  Vision Screening: Recommended annual ophthalmology exams for early detection of glaucoma and other disorders of the eye.  Is the patient up to date with their annual eye exam?  Yes  Who is the provider or what is the name of the office in which the patient attends annual eye exams? Dr.Bowen  If pt is not established with a provider, would they like to be referred to a provider to establish care? No .   Dental Screening: Recommended annual dental exams for proper oral hygiene  Diabetic Foot Exam: Diabetic Foot Exam: Completed N/A   Community Resource Referral / Chronic Care Management: CRR required this visit?  No   CCM required this visit?  No     Plan:     I have personally reviewed and noted the following in the patient's chart:   Medical and social history Use of alcohol, tobacco or illicit drugs  Current medications and supplements including opioid prescriptions. Patient is not currently taking opioid prescriptions. Functional ability and status Nutritional status Physical activity Advanced  directives List of other physicians Hospitalizations, surgeries, and ER visits in previous 12 months Vitals Screenings to include cognitive, depression, and falls Referrals and appointments  In addition, I have reviewed and discussed with patient certain preventive protocols, quality metrics, and best practice recommendations. A written personalized care plan for preventive services as well as general preventive health recommendations were provided to patient.     Roxan JAYSON Plough, NP   06/14/2024   After Visit Summary: (MyChart) Due to this being a telephonic visit, the after visit summary with patients personalized plan was offered to patient via MyChart   Nurse Notes: Advised to get COVID-19 vaccine at the pharmacy and Influenza vaccine during the fall,2025   Spent 20 minutes of face to face with patient  >50% time spent counseling; reviewing medical record; AWV and developing future plan of care.

## 2024-06-15 ENCOUNTER — Ambulatory Visit: Admitting: Sports Medicine

## 2024-06-21 DIAGNOSIS — F320 Major depressive disorder, single episode, mild: Secondary | ICD-10-CM | POA: Diagnosis not present

## 2024-07-13 ENCOUNTER — Ambulatory Visit: Admitting: Adult Health

## 2024-07-19 ENCOUNTER — Encounter: Payer: Self-pay | Admitting: Sports Medicine

## 2024-07-19 ENCOUNTER — Ambulatory Visit (INDEPENDENT_AMBULATORY_CARE_PROVIDER_SITE_OTHER): Admitting: Sports Medicine

## 2024-07-19 VITALS — BP 128/82 | HR 60 | Temp 98.2°F | Ht 69.0 in | Wt 176.8 lb

## 2024-07-19 DIAGNOSIS — K219 Gastro-esophageal reflux disease without esophagitis: Secondary | ICD-10-CM | POA: Diagnosis not present

## 2024-07-19 DIAGNOSIS — I10 Essential (primary) hypertension: Secondary | ICD-10-CM | POA: Diagnosis not present

## 2024-07-19 DIAGNOSIS — E782 Mixed hyperlipidemia: Secondary | ICD-10-CM

## 2024-07-19 DIAGNOSIS — R2689 Other abnormalities of gait and mobility: Secondary | ICD-10-CM | POA: Diagnosis not present

## 2024-07-19 DIAGNOSIS — F32A Depression, unspecified: Secondary | ICD-10-CM | POA: Diagnosis not present

## 2024-07-19 NOTE — Progress Notes (Unsigned)
 Careteam: Patient Care Team: Sherlynn Madden, MD as PCP - General (Internal Medicine) Elicia Claw, MD as Consulting Physician (Gastroenterology)  PLACE OF SERVICE:  [facility]  Advanced Directive information    Allergies  Allergen Reactions   Codeine Nausea Only    Chief Complaint  Patient presents with   Medical Management of Chronic Issues    3 month follow up      Discussed the use of AI scribe software for clinical note transcription with the patient, who gave verbal consent to proceed.  History of Present Illness  HTN    Latest Ref Rng & Units 03/08/2024    3:19 PM 10/20/2023    2:25 PM 03/18/2023    3:47 PM  BMP  Glucose 65 - 139 mg/dL 92  899  898   BUN 7 - 25 mg/dL 20  18  26    Creatinine 0.70 - 1.28 mg/dL 8.92  8.92  8.84   BUN/Creat Ratio 6 - 22 (calc) SEE NOTE:  SEE NOTE:  23   Sodium 135 - 146 mmol/L 142  141  143   Potassium 3.5 - 5.3 mmol/L 3.5  4.2  3.9   Chloride 98 - 110 mmol/L 110  107  109   CO2 20 - 32 mmol/L 23  29  25    Calcium  8.6 - 10.3 mg/dL 9.3  9.7  9.5        Review of Systems:  Review of Systems  Constitutional:  Negative for chills and fever.  HENT:  Negative for congestion and sore throat.   Eyes:  Negative for double vision.  Respiratory:  Negative for cough, sputum production and shortness of breath.   Cardiovascular:  Negative for chest pain, palpitations and leg swelling.  Gastrointestinal:  Negative for abdominal pain, heartburn and nausea.  Genitourinary:  Negative for dysuria, frequency and hematuria.  Musculoskeletal:  Negative for falls and myalgias.  Neurological:  Negative for dizziness.   Negative unless indicated in HPI.   Past Medical History:  Diagnosis Date   Arthritis    Depression    Past Surgical History:  Procedure Laterality Date   BACK SURGERY     BIOPSY  08/12/2019   Procedure: BIOPSY;  Surgeon: Elicia Claw, MD;  Location: [facility];  Service: Gastroenterology;;   BIOPSY   09/04/2021   Procedure: BIOPSY;  Surgeon: Elicia Claw, MD;  Location: [facility];  Service: Gastroenterology;;   CATARACT EXTRACTION     Per [facility] New Patient Packet   COLONOSCOPY     Per [facility] New Patient Packet   ESOPHAGOGASTRODUODENOSCOPY N/A 09/04/2021   Procedure: ESOPHAGOGASTRODUODENOSCOPY (EGD);  Surgeon: Elicia Claw, MD;  Location: [facility];  Service: Gastroenterology;  Laterality: N/A;   ESOPHAGOGASTRODUODENOSCOPY (EGD) WITH PROPOFOL  N/A 08/12/2019   Procedure: ESOPHAGOGASTRODUODENOSCOPY (EGD) WITH PROPOFOL ;  Surgeon: Elicia Claw, MD;  Location: [facility];  Service: Gastroenterology;  Laterality: N/A;   EYE SURGERY Bilateral    cataracts   KNEE ARTHROSCOPY Left    LUMBAR LAMINECTOMY/DECOMPRESSION MICRODISCECTOMY N/A 06/27/2019   Procedure: L3-4 HEMILAMINECTOMY DISCECTOMY;  Surgeon: Bluford Standing, MD;  Location: [facility] ORS;  Service: Neurosurgery;  Laterality: N/A;   UPPER GI ENDOSCOPY  2021   Per [facility] New Patient Packet, Dr.Brahmbatt   Social History:   reports that he quit smoking about 20 years ago. His smoking use included cigarettes. He started smoking about 35 years ago. He has a 15 pack-year smoking history. He has never used smokeless tobacco. He reports current alcohol use. He reports that  he does not use drugs.  Family History  Problem Relation Age of Onset   Stroke Mother    Pneumonia Mother    Arthritis Mother    Cancer Father    Stroke Maternal Grandmother    Cancer Maternal Grandfather     Medications: Patient's Medications  New Prescriptions   No medications on file  Previous Medications   ACETAMINOPHEN  (TYLENOL ) 500 MG TABLET    Take 500 mg by mouth as needed (pain).   AMLODIPINE  (NORVASC ) 10 MG TABLET    Take 1 tablet (10 mg total) by mouth daily.   ATORVASTATIN  (LIPITOR) 40 MG TABLET    Take 1 tablet (40 mg total) by mouth daily.   BLOOD PRESSURE KIT    1 Device by Does not apply route daily.   BUPROPION (WELLBUTRIN XL) 150 MG 24 HR  TABLET    Take 150 mg by mouth daily.   MULTIPLE VITAMIN (MULTIVITAMIN WITH MINERALS) TABS TABLET    Take 1 tablet by mouth daily.   PANTOPRAZOLE  (PROTONIX ) 40 MG TABLET    TAKE 1 TABLET BY MOUTH DAILY   SERTRALINE  (ZOLOFT ) 100 MG TABLET    Take 200 mg by mouth at bedtime.  Modified Medications   No medications on file  Discontinued Medications   No medications on file    Physical Exam: Vitals:   07/19/24 1450  Height: 5' 9 (1.753 m)   Body mass index is 26.58 kg/m. BP Readings from Last 3 Encounters:  04/12/24 (!) 142/80  03/08/24 (!) 156/90  10/20/23 124/70   Wt Readings from Last 3 Encounters:  04/12/24 180 lb (81.6 kg)  03/08/24 182 lb 3.2 oz (82.6 kg)  10/20/23 177 lb 6.4 oz (80.5 kg)    Physical Exam Constitutional:      Appearance: Normal appearance.  HENT:     Head: Normocephalic and atraumatic.  Cardiovascular:     Rate and Rhythm: Normal rate and regular rhythm.     Pulses: Normal pulses.     Heart sounds: Normal heart sounds.  Pulmonary:     Effort: No respiratory distress.     Breath sounds: No stridor. No wheezing or rales.  Abdominal:     General: Bowel sounds are normal. There is no distension.     Palpations: Abdomen is soft.     Tenderness: There is no abdominal tenderness. There is no guarding.  Musculoskeletal:        General: No swelling.  Skin:    Comments: Bruise on left side chest from recent fall  Neurological:     Mental Status: He is alert. Mental status is at baseline.     Motor: No weakness.     Labs reviewed: Basic Metabolic Panel: Recent Labs    10/20/23 1425 03/08/24 1519  NA 141 142  K 4.2 3.5  CL 107 110  CO2 29 23  GLUCOSE 100* 92  BUN 18 20  CREATININE 1.07 1.07  CALCIUM  9.7 9.3   Liver Function Tests: Recent Labs    10/20/23 1425  AST 20  ALT 23  BILITOT 0.4  PROT 6.5   No results for input(s): LIPASE, AMYLASE in the last 8760 hours. No results for input(s): AMMONIA in the last 8760  hours. CBC: Recent Labs    03/08/24 1519  WBC 7.3  HGB 12.6*  HCT 39.4  MCV 87.2  PLT 165   Lipid Panel: No results for input(s): CHOL, HDL, LDLCALC, TRIG, CHOLHDL, LDLDIRECT in the last 8760 hours. TSH: No  results for input(s): TSH in the last 8760 hours. A1C: Lab Results  Component Value Date   HGBA1C 5.0 09/16/2021    Assessment and Plan Assessment & Plan       No follow-ups on file.:   Yobani Schertzer

## 2024-08-09 DIAGNOSIS — F320 Major depressive disorder, single episode, mild: Secondary | ICD-10-CM | POA: Diagnosis not present

## 2024-08-19 DIAGNOSIS — F320 Major depressive disorder, single episode, mild: Secondary | ICD-10-CM | POA: Diagnosis not present

## 2024-08-26 DIAGNOSIS — F320 Major depressive disorder, single episode, mild: Secondary | ICD-10-CM | POA: Diagnosis not present

## 2024-08-29 ENCOUNTER — Other Ambulatory Visit: Payer: Self-pay | Admitting: Sports Medicine

## 2024-08-29 DIAGNOSIS — I10 Essential (primary) hypertension: Secondary | ICD-10-CM

## 2024-08-29 MED ORDER — AMLODIPINE BESYLATE 10 MG PO TABS: 10.0000 mg | ORAL_TABLET | Freq: Every day | ORAL | 1 refills | Status: AC

## 2024-08-29 NOTE — Telephone Encounter (Signed)
 Copied from CRM [id number]. Topic: Clinical - Medication Refill >> Aug 29, 2024  1:25 PM Carrielelia G wrote: Medication: amLODipine  (NORVASC ) 10 MG table  Has the patient contacted their pharmacy? No (Agent: If no, request that the patient contact the pharmacy for the refill. If patient does not wish to contact the pharmacy document the reason why and proceed with request.) (Agent: If yes, when and what did the pharmacy advise?)  This is the patient's preferred pharmacy:  [facility] [id number] [place], [place] - [address] [place] [address] Phone: [phone] Fax: [phone]  Is this the correct pharmacy for this prescription? Yes If no, delete pharmacy and type the correct one.    Is the patient out of the medication? No  Has the patient been seen for an appointment in the last year OR does the patient have an upcoming appointment? Yes  Can we respond through MyChart? No  Agent: Please be advised that Rx refills may take up to 3 business days. We ask that you follow-up with your pharmacy.

## 2024-09-09 ENCOUNTER — Other Ambulatory Visit: Payer: Self-pay | Admitting: Sports Medicine

## 2024-09-09 DIAGNOSIS — I10 Essential (primary) hypertension: Secondary | ICD-10-CM

## 2024-09-11 ENCOUNTER — Other Ambulatory Visit: Payer: Self-pay | Admitting: Sports Medicine
